# Patient Record
Sex: Male | Born: 1935 | Race: White | Hispanic: No | Smoking: Former smoker
Health system: Southern US, Community
[De-identification: ages and names within clinical notes are randomized; demographics above are authoritative.]

## PROBLEM LIST (undated history)

## (undated) DIAGNOSIS — G479 Sleep disorder, unspecified: Secondary | ICD-10-CM

## (undated) DIAGNOSIS — I2510 Atherosclerotic heart disease of native coronary artery without angina pectoris: Secondary | ICD-10-CM

## (undated) DIAGNOSIS — Z5189 Encounter for other specified aftercare: Secondary | ICD-10-CM

## (undated) DIAGNOSIS — Z951 Presence of aortocoronary bypass graft: Secondary | ICD-10-CM

## (undated) DIAGNOSIS — M1990 Unspecified osteoarthritis, unspecified site: Secondary | ICD-10-CM

## (undated) DIAGNOSIS — I10 Essential (primary) hypertension: Secondary | ICD-10-CM

## (undated) DIAGNOSIS — Z8719 Personal history of other diseases of the digestive system: Secondary | ICD-10-CM

## (undated) DIAGNOSIS — R0602 Shortness of breath: Secondary | ICD-10-CM

## (undated) DIAGNOSIS — Z955 Presence of coronary angioplasty implant and graft: Secondary | ICD-10-CM

## (undated) DIAGNOSIS — C3410 Malignant neoplasm of upper lobe, unspecified bronchus or lung: Secondary | ICD-10-CM

## (undated) DIAGNOSIS — J189 Pneumonia, unspecified organism: Secondary | ICD-10-CM

## (undated) DIAGNOSIS — R011 Cardiac murmur, unspecified: Secondary | ICD-10-CM

## (undated) DIAGNOSIS — I493 Ventricular premature depolarization: Secondary | ICD-10-CM

## (undated) DIAGNOSIS — A159 Respiratory tuberculosis unspecified: Secondary | ICD-10-CM

## (undated) DIAGNOSIS — I209 Angina pectoris, unspecified: Secondary | ICD-10-CM

## (undated) DIAGNOSIS — K219 Gastro-esophageal reflux disease without esophagitis: Secondary | ICD-10-CM

## (undated) DIAGNOSIS — J3489 Other specified disorders of nose and nasal sinuses: Secondary | ICD-10-CM

## (undated) DIAGNOSIS — D649 Anemia, unspecified: Secondary | ICD-10-CM

## (undated) DIAGNOSIS — Z8611 Personal history of tuberculosis: Secondary | ICD-10-CM

## (undated) DIAGNOSIS — N200 Calculus of kidney: Secondary | ICD-10-CM

## (undated) DIAGNOSIS — R42 Dizziness and giddiness: Secondary | ICD-10-CM

## (undated) DIAGNOSIS — IMO0001 Reserved for inherently not codable concepts without codable children: Secondary | ICD-10-CM

## (undated) DIAGNOSIS — I4892 Unspecified atrial flutter: Secondary | ICD-10-CM

## (undated) DIAGNOSIS — E785 Hyperlipidemia, unspecified: Secondary | ICD-10-CM

## (undated) HISTORY — DX: Presence of coronary angioplasty implant and graft: Z95.5

## (undated) HISTORY — PX: INGUINAL HERNIA REPAIR: SUR1180

## (undated) HISTORY — PX: CHOLECYSTECTOMY: SHX55

## (undated) HISTORY — DX: Ventricular premature depolarization: I49.3

## (undated) HISTORY — DX: Unspecified atrial flutter: I48.92

## (undated) HISTORY — DX: Presence of aortocoronary bypass graft: Z95.1

## (undated) HISTORY — PX: APPENDECTOMY: SHX54

## (undated) HISTORY — PX: PARTIAL GASTRECTOMY: SHX2172

## (undated) HISTORY — PX: CATARACT EXTRACTION W/ INTRAOCULAR LENS IMPLANT: SHX1309

## (undated) HISTORY — PX: LITHOTRIPSY: SUR834

## (undated) HISTORY — DX: Malignant neoplasm of upper lobe, unspecified bronchus or lung: C34.10

## (undated) HISTORY — PX: OTHER SURGICAL HISTORY: SHX169

---

## 1967-01-17 DIAGNOSIS — A159 Respiratory tuberculosis unspecified: Secondary | ICD-10-CM

## 1967-01-17 HISTORY — DX: Respiratory tuberculosis unspecified: A15.9

## 1999-03-24 ENCOUNTER — Encounter: Payer: Self-pay | Admitting: Cardiology

## 1999-03-24 ENCOUNTER — Inpatient Hospital Stay (HOSPITAL_COMMUNITY): Admission: AD | Admit: 1999-03-24 | Discharge: 1999-04-07 | Payer: Self-pay | Admitting: Cardiology

## 1999-03-25 ENCOUNTER — Encounter: Payer: Self-pay | Admitting: Cardiology

## 1999-03-25 ENCOUNTER — Encounter: Payer: Self-pay | Admitting: Thoracic Surgery (Cardiothoracic Vascular Surgery)

## 1999-03-25 HISTORY — PX: OTHER SURGICAL HISTORY: SHX169

## 1999-03-25 HISTORY — PX: CORONARY ARTERY BYPASS GRAFT: SHX141

## 1999-03-26 ENCOUNTER — Encounter: Payer: Self-pay | Admitting: Thoracic Surgery (Cardiothoracic Vascular Surgery)

## 1999-03-27 ENCOUNTER — Encounter: Payer: Self-pay | Admitting: Thoracic Surgery (Cardiothoracic Vascular Surgery)

## 1999-04-01 ENCOUNTER — Encounter: Payer: Self-pay | Admitting: Thoracic Surgery (Cardiothoracic Vascular Surgery)

## 1999-04-06 ENCOUNTER — Encounter: Payer: Self-pay | Admitting: Thoracic Surgery (Cardiothoracic Vascular Surgery)

## 2005-08-04 ENCOUNTER — Encounter: Admission: RE | Admit: 2005-08-04 | Discharge: 2005-08-04 | Payer: Self-pay | Admitting: Vascular Surgery

## 2005-08-17 ENCOUNTER — Ambulatory Visit (HOSPITAL_COMMUNITY): Admission: RE | Admit: 2005-08-17 | Discharge: 2005-08-17 | Payer: Self-pay | Admitting: Thoracic Surgery

## 2010-11-15 ENCOUNTER — Other Ambulatory Visit: Payer: Self-pay | Admitting: Cardiology

## 2010-11-15 ENCOUNTER — Ambulatory Visit
Admission: RE | Admit: 2010-11-15 | Discharge: 2010-11-15 | Disposition: A | Discharge status: Home / Self Care | Payer: Medicare Other | Source: Ambulatory Visit | Attending: Cardiology | Admitting: Cardiology

## 2010-11-15 DIAGNOSIS — R0789 Other chest pain: Secondary | ICD-10-CM

## 2010-11-16 ENCOUNTER — Encounter (HOSPITAL_COMMUNITY): Payer: Self-pay | Admitting: Cardiology

## 2010-11-16 ENCOUNTER — Ambulatory Visit (HOSPITAL_COMMUNITY)
Admission: RE | Admit: 2010-11-16 | Discharge: 2010-11-17 | Disposition: A | Discharge status: Home / Self Care | Payer: Medicare Other | Source: Ambulatory Visit | Attending: Cardiology | Admitting: Cardiology

## 2010-11-16 DIAGNOSIS — D62 Acute posthemorrhagic anemia: Secondary | ICD-10-CM | POA: Insufficient documentation

## 2010-11-16 DIAGNOSIS — Z951 Presence of aortocoronary bypass graft: Secondary | ICD-10-CM

## 2010-11-16 DIAGNOSIS — J3489 Other specified disorders of nose and nasal sinuses: Secondary | ICD-10-CM | POA: Insufficient documentation

## 2010-11-16 DIAGNOSIS — I2510 Atherosclerotic heart disease of native coronary artery without angina pectoris: Secondary | ICD-10-CM

## 2010-11-16 DIAGNOSIS — D696 Thrombocytopenia, unspecified: Secondary | ICD-10-CM | POA: Insufficient documentation

## 2010-11-16 DIAGNOSIS — Z8719 Personal history of other diseases of the digestive system: Secondary | ICD-10-CM

## 2010-11-16 DIAGNOSIS — E8779 Other fluid overload: Secondary | ICD-10-CM | POA: Insufficient documentation

## 2010-11-16 DIAGNOSIS — Z8611 Personal history of tuberculosis: Secondary | ICD-10-CM

## 2010-11-16 DIAGNOSIS — I4891 Unspecified atrial fibrillation: Secondary | ICD-10-CM | POA: Insufficient documentation

## 2010-11-16 DIAGNOSIS — Z01812 Encounter for preprocedural laboratory examination: Secondary | ICD-10-CM | POA: Insufficient documentation

## 2010-11-16 DIAGNOSIS — E785 Hyperlipidemia, unspecified: Secondary | ICD-10-CM

## 2010-11-16 DIAGNOSIS — R079 Chest pain, unspecified: Secondary | ICD-10-CM | POA: Insufficient documentation

## 2010-11-16 DIAGNOSIS — I25810 Atherosclerosis of coronary artery bypass graft(s) without angina pectoris: Secondary | ICD-10-CM | POA: Insufficient documentation

## 2010-11-16 HISTORY — DX: Atherosclerotic heart disease of native coronary artery without angina pectoris: I25.10

## 2010-11-16 HISTORY — DX: Hyperlipidemia, unspecified: E78.5

## 2010-11-16 HISTORY — DX: Personal history of tuberculosis: Z86.11

## 2010-11-16 HISTORY — DX: Presence of aortocoronary bypass graft: Z95.1

## 2010-11-16 HISTORY — DX: Personal history of other diseases of the digestive system: Z87.19

## 2010-11-17 ENCOUNTER — Encounter: Payer: Self-pay | Admitting: *Deleted

## 2010-11-17 ENCOUNTER — Ambulatory Visit (HOSPITAL_COMMUNITY): Payer: Medicare Other

## 2010-11-17 ENCOUNTER — Encounter (HOSPITAL_COMMUNITY): Payer: Self-pay | Admitting: Cardiology

## 2010-11-17 DIAGNOSIS — Z8611 Personal history of tuberculosis: Secondary | ICD-10-CM

## 2010-11-17 DIAGNOSIS — I2510 Atherosclerotic heart disease of native coronary artery without angina pectoris: Secondary | ICD-10-CM

## 2010-11-17 DIAGNOSIS — Z01810 Encounter for preprocedural cardiovascular examination: Secondary | ICD-10-CM

## 2010-11-17 HISTORY — DX: Personal history of tuberculosis: Z86.11

## 2010-11-17 LAB — CBC
Hemoglobin: 13.8 g/dL (ref 13.0–17.0)
MCHC: 32.9 g/dL (ref 30.0–36.0)
MCV: 95.9 fL (ref 78.0–100.0)
Platelets: 128 10*3/uL — ABNORMAL LOW (ref 150–400)
RBC: 4.37 MIL/uL (ref 4.22–5.81)
RDW: 13.4 % (ref 11.5–15.5)
WBC: 6.5 10*3/uL (ref 4.0–10.5)

## 2010-11-17 LAB — COMPREHENSIVE METABOLIC PANEL
ALT: 15 U/L (ref 0–53)
Albumin: 3.2 g/dL — ABNORMAL LOW (ref 3.5–5.2)
Creatinine, Ser: 0.91 mg/dL (ref 0.50–1.35)
GFR calc Af Amer: 88 mL/min — ABNORMAL LOW (ref >90)
Glucose, Bld: 84 mg/dL (ref 70–99)
Sodium: 139 mEq/L (ref 135–145)
Total Protein: 6.3 g/dL (ref 6.0–8.3)

## 2010-11-17 LAB — LIPID PANEL
Cholesterol: 141 mg/dL (ref 0–200)
HDL: 39 mg/dL — ABNORMAL LOW (ref >39)
Total CHOL/HDL Ratio: 3.6 RATIO

## 2010-11-17 LAB — PROTIME-INR: INR: 1.23 (ref 0.00–1.49)

## 2010-11-17 LAB — APTT: aPTT: 32 seconds (ref 24–37)

## 2010-11-18 ENCOUNTER — Encounter (HOSPITAL_COMMUNITY): Payer: Self-pay | Admitting: Pharmacy Technician

## 2010-11-18 NOTE — Consult Note (Signed)
Jacob Hampton, Jacob Hampton               ACCOUNT NO.:  [id number]  MEDICAL RECORD NO.:  [id number]  LOCATION:  4703                         FACILITY:  [facility]  PHYSICIAN:  Salvatore Decent. Cornelius Moras, M.D. DATE OF BIRTH:  10-03-1924  DATE OF CONSULTATION:  11/16/2010 DATE OF DISCHARGE:  11/17/2010                                CONSULTATION   REQUESTING PHYSICIAN:  Georga Hacking, MD  REASON FOR CONSULTATION:  Left main disease with three-vessel coronary artery disease and vein graft disease, status post coronary artery bypass grafting in 2001.  HISTORY OF PRESENT ILLNESS:  The patient is an 77 year old gentleman with known history of coronary artery disease, hyperlipidemia and peptic ulcer disease.  The patient originally underwent coronary artery bypass grafting x5 in 2001 for left main disease with 3-vessel coronary artery disease and normal left ventricular function.  Graft was placed at the time of surgery included left internal mammary artery to the distal left anterior descending coronary artery, saphenous vein graft to the diagonal branch, saphenous vein graft to the obtuse marginal branch of the left circumflex coronary artery, and sequential saphenous vein graft to the posterior descending and posterolateral branches of the right coronary artery.  The patient did have severe coagulopathy and bleeding early after surgery requiring re-exploration.  He recovered uneventfully from his surgery and has done remarkably well until recently.  Several weeks ago, the patient began to develop typical symptoms of angina pectoris.  Initially, the symptoms occurred when he was walking up a hill to go to a football game.  Since then, he has had 4 or 5 episodes of chest discomfort usually with physical exertion.  Symptoms are classical in nature and usually relieved promptly by rest.  He has had 1 or 2 episodes that have occurred after he has eaten meals.  Symptoms are promptly relieved with  nitroglycerin.  He was seen by Dr. Donnie Aho and scheduled for elective cardiac catheterizations.  Catheterizations was performed on November 16, 2010 and findings Were notable for severe left main disease with 95-99% distal stenosis of the left main coronary artery.  There is 100% proximal occlusion of the left anterior descending coronary artery.  There is severe 95-99% proximal stenosis of the right coronary artery with diffuse disease in the midportion and distal portion of that vessel.  The left internal mammary artery graft is widely patent to the left anterior descending coronary artery.  All of the previous vein grafts are occluded.  Left ventricular function is normal with no significant wall motion abnormalities.  Cardiothoracic surgical consultation was requested to consider redo surgical revascularization.  REVIEW OF SYSTEMS:  GENERAL: The patient reports otherwise feeling remarkably well and remains quite active physically for a gentleman his age.  He has normal appetite and has not been gaining nor losing weight recently.  CARDIAC:  Notable for accelerating symptoms of crescendo and angina pectoris as noted.  The patient denies any prolonged episodes of chest pain lasting more than a few minutes.  He denies any nocturnal angina.  He has mild exertional shortness of breath.  He denies resting shortness of breath, PND, orthopnea, or lower extremity edema.  He has not had tachypalpitations or syncope.  RESPIRATORY:  Negative.  The patient denies productive cough, hemoptysis, wheezing. GASTROINTESTINAL:  Negative.  The patient does have remote history of bleeding peptic ulcers for which he underwent subtotal gastrectomy in the past.  He has not had problems with ulcers since his subtotal gastrectomy many years ago.  He reports normal bowel function.  He does report some tendency towards diarrhea but this is not problematic.  He has not had hematochezia, hematemesis, melena.   MUSCULOSKELETAL: Negative.  The patient is remarkably free of any problems with arthritis or arthralgias.  NEUROLOGIC:  Negative.  The patient does report mild tendencies for short-term memory loss, but this has not at all been problematic and he otherwise reports no cognitive issues whatsoever.  He specifically denies any transient monocular blindness or transient numbness or weakness involving either upper or lower extremity. GENITOURINARY:  Negative.  HEENT:  Negative.  INFECTIOUS: Negative.  PAST MEDICAL HISTORY: 1. Coronary artery disease. 2. Hyperlipidemia. 3. Peptic ulcer disease. 4. Peripheral vascular disease.  PAST SURGICAL HISTORY: 1. Subtotal gastrectomy. 2. Herniorrhaphy. 3. Right testicle surgery. 4. Coronary artery bypass grafting x5, March 25, 1999.  FAMILY HISTORY:  Noncontributory.  SOCIAL HISTORY:  The patient is a widower who lives with 1 of the sons locally.  He remains remarkably active physically and enjoys bowling and walking on a treadmill.  He still drives an automobile and remains completely functionally independent.  He is retired, having previously worked at [employer].  He has a remote history of tobacco use, but he quit smoking in 1996.  He does not use alcohol.  MEDICATIONS PRIOR TO ADMISSION: 1. Toprol-XL 50 mg daily. 2. Lipitor 80 mg daily. 3. Folic acid 1 mg daily. 4. Nitroglycerin sublingual as needed.  DRUG ALLERGIES:  None known.  The patient has history of bleeding ulcer with the aspirin therapy.  PHYSICAL EXAMINATION:  GENERAL:  The patient is A well-appearing male who appears somewhat younger than stated age, in no acute distress.  He is in sinus rhythm. HEENT:  Unrevealing. NECK:  Supple.  There is no cervical nor supraclavicular lymphadenopathy.  There is no jugular venous distention.  There are no carotid bruits.  There is a well-healed median sternotomy scar. CHEST:  Auscultation of the chest demonstrates clear  breath sounds, which are symmetrical.  No wheezes, rales, or rhonchi are noted. CARDIOVASCULAR:  Notable for regular rate and rhythm.  No murmurs, rubs, or gallops are noted. ABDOMEN:  Soft, nondistended, and nontender.  There are no palpable masses. EXTREMITIES:  Warm and well perfused.  There is no lower extremity edema.  Distal pulses are not palpable.  There is a well-healed surgical scar up and down the entire left leg and the majority of the left thigh from previous saphenous vein procurement.  There is a small incision just above the ankle on the right where the saphenous vein was exposed. The remainder of the saphenous vein on the right appears to be intact. There is no sign of varicose veins or venous insufficiency. SKIN:  Clean, dry, and healthy appearing throughout. RECTAL:  Deferred. GU:  Deferred. NEUROLOGIC:  Grossly nonfocal and symmetrical bilaterally.  DIAGNOSTIC TESTS:  Cardiac catheterizations performed by Dr. Donnie Aho is reviewed.  This demonstrates severe left main disease with three-vessel coronary artery disease as well as vein graft disease, status post coronary artery bypass grafting in the past.  Specifically, there is 95- 99% discrete stenosis of the distal left main coronary artery just before the origin of the left circumflex coronary  artery.  The left circumflex coronary artery gives rise to a single large dominant obtuse marginal branch.  There is a significant angulation of the takeoff of the ostium of the left circumflex coronary artery just beyond the critical stenosis of the left main coronary artery.  The left anterior descending coronary artery is chronically occluded.  There is severe 95- 99% proximal discrete stenosis of the right coronary artery.  The midportion of the right coronary artery has diffuse disease.  The left internal mammary artery graft is widely patent to the distal left anterior descending coronary artery and without any  significant lesions. All of the previous vein grafts are occluded.  Left ventricular function appears normal.  IMPRESSION:  Left main disease with three-vessel coronary artery disease and normal left ventricular function.  The patient presents with accelerating symptoms of angina pectoris.  He has vein graft disease with all previous vein grafts from bypass surgery in 2001, now occluded. The left internal mammary artery graft is widely patent to the distal left anterior descending coronary artery.  Options at this juncture include redo coronary artery bypass grafting versus high-risk percutaneous coronary intervention and stenting of the left main, which is protected in the proximal right coronary artery.  Due to the severe angulation of the ostium of the left circumflex, percutaneous coronary intervention and stenting of the left main might be potentially risky despite the presence of the patent left internal mammary artery graft. Surgical revascularization of the distal right coronary circulation may be potentially hampered by diffuse disease in the target vessels for grafting.  Nonetheless, I suspect that the patient would probably do best with redo coronary artery bypass grafting.  Risks of surgery will be significantly elevated because of the patient's advanced age. Portions of the vein utilized at the time of the surgery in 2001, were apparently poor quality.  The patient does appear to have saphenous vein conduit in the right thigh and the upper portion of the right lower leg, which could be used for redo coronary artery bypass grafting.  We could always use left radial artery if necessary if inadequate vein conduit was noted intraoperatively.  PLAN:  I have discussed the matters at length with Mr. Hogston and his entire family.  The relative risks and benefits of redo coronary artery bypass grafting have been compared and contrasted with high-risk PCI and stenting.  With  continued medical therapy, I think the patient would be at remarkably high risk for significant heart attack in the near future, and as a result, I think that something will need to be done.  Overall, I suspect the patient would do best with surgery.  The patient and his family understand and accept all potential associated risks including, but not limited to risk of death, stroke, myocardial infarction, congestive heart failure, respiratory failure, renal failure, bleeding requiring blood transfusion, or re-exploration following surgery, arrhythmia, infection, late recurrence of coronary artery disease.  They understand the issues and risks related to repeat coronary artery bypass surgery as well as issues related to the patient's advanced age.  The patient understands that he may need at least temporary assistance during his recovery and he potentially could wind up losing some functional independence if he has any complications following surgery.  All of his questions have been addressed.  We tentatively plan to proceed with surgery on Tuesday, November 22, 2010.     Salvatore Decent. Cornelius Moras, M.D.     CHO/MEDQ  D:  11/17/2010  T:  11/17/2010  Job:  [id number]  cc:   Georga Hacking, M.D. Warrick Parisian, MD  Electronically Signed by Tressie Stalker M.D. on 11/18/2010 08:08:59 AM

## 2010-11-20 LAB — TYPE AND SCREEN
Donor AG Type: NEGATIVE
Unit division: 0

## 2010-11-21 ENCOUNTER — Encounter: Payer: Self-pay | Admitting: Thoracic Surgery (Cardiothoracic Vascular Surgery)

## 2010-11-21 ENCOUNTER — Encounter (HOSPITAL_COMMUNITY): Payer: Self-pay

## 2010-11-21 ENCOUNTER — Ambulatory Visit (INDEPENDENT_AMBULATORY_CARE_PROVIDER_SITE_OTHER): Payer: Medicare Other | Admitting: Thoracic Surgery (Cardiothoracic Vascular Surgery)

## 2010-11-21 ENCOUNTER — Encounter (HOSPITAL_COMMUNITY)
Admit: 2010-11-21 | Discharge: 2010-11-21 | Disposition: A | Discharge status: Home / Self Care | Payer: Medicare Other | Attending: Thoracic Surgery (Cardiothoracic Vascular Surgery) | Admitting: Thoracic Surgery (Cardiothoracic Vascular Surgery)

## 2010-11-21 VITALS — BP 133/78 | HR 64 | Resp 20 | Ht 67.0 in | Wt 164.0 lb

## 2010-11-21 DIAGNOSIS — I2510 Atherosclerotic heart disease of native coronary artery without angina pectoris: Secondary | ICD-10-CM

## 2010-11-21 HISTORY — DX: Encounter for other specified aftercare: Z51.89

## 2010-11-21 HISTORY — DX: Respiratory tuberculosis unspecified: A15.9

## 2010-11-21 HISTORY — DX: Essential (primary) hypertension: I10

## 2010-11-21 HISTORY — DX: Pneumonia, unspecified organism: J18.9

## 2010-11-21 HISTORY — DX: Gastro-esophageal reflux disease without esophagitis: K21.9

## 2010-11-21 HISTORY — DX: Angina pectoris, unspecified: I20.9

## 2010-11-21 HISTORY — DX: Reserved for inherently not codable concepts without codable children: IMO0001

## 2010-11-21 LAB — SURGICAL PCR SCREEN
MRSA, PCR: NEGATIVE
Staphylococcus aureus: NEGATIVE

## 2010-11-21 MED ORDER — DIAZEPAM 5 MG PO TABS
5.0000 mg | ORAL_TABLET | Freq: Once | ORAL | Status: DC
  Administered 2010-11-22: 5 mg via ORAL

## 2010-11-21 MED ORDER — SODIUM CHLORIDE 0.9 % IV SOLN
INTRAVENOUS | Status: DC
  Filled 2010-11-21: qty 1

## 2010-11-21 MED ORDER — NITROGLYCERIN IN D5W 200-5 MCG/ML-% IV SOLN
2.0000 ug/min | INTRAVENOUS | Status: DC
  Filled 2010-11-21: qty 250

## 2010-11-21 MED ORDER — DEXMEDETOMIDINE HCL 100 MCG/ML IV SOLN
0.1000 ug/kg/h | INTRAVENOUS | Status: DC
  Filled 2010-11-21: qty 4

## 2010-11-21 MED ORDER — MAGNESIUM SULFATE 50 % IJ SOLN
40.0000 meq | INTRAMUSCULAR | Status: DC
  Filled 2010-11-21: qty 10

## 2010-11-21 MED ORDER — EPINEPHRINE HCL 1 MG/ML IJ SOLN
0.5000 ug/min | INTRAMUSCULAR | Status: DC
  Filled 2010-11-21: qty 4

## 2010-11-21 MED ORDER — VANCOMYCIN HCL IN DEXTROSE 1-5 GM/200ML-% IV SOLN
1000.0000 mg | INTRAVENOUS | Status: DC
  Filled 2010-11-21: qty 200

## 2010-11-21 MED ORDER — DEXTROSE 5 % IV SOLN
750.0000 mg | INTRAVENOUS | Status: DC
  Filled 2010-11-21: qty 750

## 2010-11-21 MED ORDER — POTASSIUM CHLORIDE 2 MEQ/ML IV SOLN
80.0000 meq | INTRAVENOUS | Status: DC
  Filled 2010-11-21: qty 40

## 2010-11-21 MED ORDER — PHENYLEPHRINE HCL 10 MG/ML IJ SOLN
30.0000 ug/min | INTRAVENOUS | Status: DC
  Filled 2010-11-21: qty 2

## 2010-11-21 MED ORDER — PLASMA-LYTE 148 IV SOLN
INTRAVENOUS | Status: DC
  Administered 2010-11-22: 10:00:00
  Filled 2010-11-21: qty 0.5

## 2010-11-21 MED ORDER — SODIUM CHLORIDE 0.9 % IV SOLN
INTRAVENOUS | Status: DC
  Filled 2010-11-21: qty 40

## 2010-11-21 MED ORDER — DOPAMINE-DEXTROSE 3.2-5 MG/ML-% IV SOLN
2.0000 ug/kg/min | INTRAVENOUS | Status: DC
  Filled 2010-11-21: qty 250

## 2010-11-21 MED ORDER — DEXTROSE 5 % IV SOLN
1.5000 g | INTRAVENOUS | Status: DC
  Filled 2010-11-21: qty 1.5

## 2010-11-21 MED ORDER — VANCOMYCIN HCL 1000 MG IV SOLR
1250.0000 mg | INTRAVENOUS | Status: DC
  Filled 2010-11-21: qty 1250

## 2010-11-21 MED ORDER — METOPROLOL TARTRATE 12.5 MG HALF TABLET: 12.5000 mg | ORAL_TABLET | Freq: Once | ORAL | Status: DC

## 2010-11-21 NOTE — H&P (Signed)
HISTORY OF PRESENT ILLNESS:  The patient is an 77 year old gentleman with known history of coronary artery disease, hyperlipidemia and peptic ulcer disease.  The patient originally underwent coronary artery bypass grafting x5 in 2001 for left main disease with 3-vessel coronary artery disease and normal left ventricular function.  Graft was placed at the time of surgery included left internal mammary artery to the distal left anterior descending coronary artery, saphenous vein graft to the diagonal branch, saphenous vein graft to the obtuse marginal branch of the left circumflex coronary artery, and sequential saphenous vein graft to the posterior descending and posterolateral branches of the right coronary artery.  The patient did have severe coagulopathy and bleeding early after surgery requiring re-exploration.  He recovered uneventfully from his surgery and has done remarkably well until recently.  Several weeks ago, the patient began to develop typical symptoms of angina pectoris.  Initially, the symptoms occurred when he was walking up a hill to go to a football game.  Since then, he has had 4 or 5 episodes of chest discomfort usually with physical exertion.  Symptoms are classical in nature and usually relieved promptly by rest.  He has had 1 or 2 episodes that have occurred after he has eaten meals.  Symptoms are promptly relieved with nitroglycerin.  He was seen by Dr. Donnie Aho and scheduled for elective cardiac catheterizations.  Catheterizations was performed on November 16, 2010 and findings Were notable for severe left main disease with 95-99% distal stenosis of the left main coronary artery.  There is 100% proximal occlusion of the left anterior descending coronary artery.  There is severe 95-99% proximal stenosis of the right coronary artery with diffuse disease in the midportion and distal portion of that vessel.  The left internal mammary artery graft is widely patent to the  left anterior descending coronary artery.  All of the previous vein grafts are occluded.  Left ventricular function is normal with no significant wall motion abnormalities.  Cardiothoracic surgical consultation was requested to consider redo surgical revascularization.   REVIEW OF SYSTEMS:  GENERAL: The patient reports otherwise feeling remarkably well and remains quite active physically for a gentleman his age.  He has normal appetite and has not been gaining nor losing weight recently.  CARDIAC:  Notable for accelerating symptoms of crescendo and angina pectoris as noted.  The patient denies any prolonged episodes of chest pain lasting more than a few minutes.  He denies any nocturnal angina.  He has mild exertional shortness of breath.  He denies resting shortness of breath, PND, orthopnea, or lower extremity edema.  He has not had tachypalpitations or syncope.  RESPIRATORY:  Negative.  The patient denies productive cough, hemoptysis, wheezing. GASTROINTESTINAL:  Negative.  The patient does have remote history of bleeding peptic ulcers for which he underwent subtotal gastrectomy in the past.  He has not had problems with ulcers since his subtotal gastrectomy many years ago.  He reports normal bowel function.  He does report some tendency towards diarrhea but this is not problematic.  He has not had hematochezia, hematemesis, melena.  MUSCULOSKELETAL: Negative.  The patient is remarkably free of any problems with arthritis or arthralgias.  NEUROLOGIC:  Negative.  The patient does report mild tendencies for short-term memory loss, but this has not at all been problematic and he otherwise reports no cognitive issues whatsoever.  He specifically denies any transient monocular blindness or transient numbness or weakness involving either upper or lower extremity. GENITOURINARY:  Negative.  HEENT:  Negative.  INFECTIOUS: Negative.   PAST MEDICAL HISTORY: 1. Coronary artery disease. 2.  Hyperlipidemia. 3. Peptic ulcer disease. 4. Peripheral vascular disease.   PAST SURGICAL HISTORY: 1. Subtotal gastrectomy. 2. Herniorrhaphy. 3. Right testicle surgery. 4. Coronary artery bypass grafting x5, March 25, 1999.   FAMILY HISTORY:  Noncontributory.   SOCIAL HISTORY:  The patient is a widower who lives with 1 of the sons locally.  He remains remarkably active physically and enjoys bowling and walking on a treadmill.  He still drives an automobile and remains completely functionally independent.  He is retired, having previously worked at [employer].  He has a remote history of tobacco use, but he quit smoking in 1996.  He does not use alcohol.   MEDICATIONS PRIOR TO ADMISSION: 1. Toprol-XL 50 mg daily. 2. Lipitor 80 mg daily. 3. Folic acid 1 mg daily. 4. Nitroglycerin sublingual as needed.   DRUG ALLERGIES:  None known.  The patient has history of bleeding ulcer with the aspirin therapy.   PHYSICAL EXAMINATION:  GENERAL:  The patient is A well-appearing male who appears somewhat younger than stated age, in no acute distress.  He is in sinus rhythm. HEENT:  Unrevealing. NECK:  Supple.  There is no cervical nor supraclavicular lymphadenopathy.  There is no jugular venous distention. There are no carotid bruits.  There is a well-healed median sternotomy scar. CHEST:  Auscultation of the chest demonstrates clear breath sounds, which are symmetrical.  No wheezes, rales, or rhonchi are noted. CARDIOVASCULAR:  Notable for regular rate and rhythm.  No murmurs, rubs, or gallops are noted. ABDOMEN:  Soft, nondistended, and nontender.  There are no palpable masses. EXTREMITIES:  Warm and well perfused.  There is no lower extremity edema.  Distal pulses are not palpable.  There is a well-healed surgical scar up and down the entire left leg and the majority of the left thigh from previous saphenous vein procurement.  There is a small incision just above the ankle on  the right where the saphenous vein was exposed. The remainder of the saphenous vein on the right appears to be intact. There is no sign of varicose veins or venous insufficiency. SKIN:  Clean, dry, and healthy appearing throughout. RECTAL:  Deferred. GU:  Deferred. NEUROLOGIC:  Grossly nonfocal and symmetrical bilaterally.   DIAGNOSTIC TESTS:  Cardiac catheterizations performed by Dr. Donnie Aho is reviewed.  This demonstrates severe left main disease with three-vessel coronary artery disease as well as vein graft disease, status post coronary artery bypass grafting in the past.  Specifically, there is 95- 99% discrete stenosis of the distal left main coronary artery just before the origin of the left circumflex coronary artery.  The left circumflex coronary artery gives rise to a single large dominant obtuse marginal branch.  There is a significant angulation of the takeoff of the ostium of the left circumflex coronary artery just beyond the critical stenosis of the left main coronary artery.  The left anterior descending coronary artery is chronically occluded.  There is severe 95- 99% proximal discrete stenosis of the right coronary artery.  The midportion of the right coronary artery has diffuse disease.  The left internal mammary artery graft is widely patent to the distal left anterior descending coronary artery and without any significant lesions. All of the previous vein grafts are occluded.  Left ventricular function appears normal.   IMPRESSION:  Left main disease with three-vessel coronary artery disease and normal left ventricular function.  The patient presents with accelerating  symptoms of angina pectoris.  He has vein graft disease with all previous vein grafts from bypass surgery in 2001, now occluded. The left internal mammary artery graft is widely patent to the distal left anterior descending coronary artery.  Options at this juncture include redo coronary artery bypass  grafting versus high-risk percutaneous coronary intervention and stenting of the left main, which is protected in the proximal right coronary artery.  Due to the severe angulation of the ostium of the left circumflex, percutaneous coronary intervention and stenting of the left main might be potentially risky despite the presence of the patent left internal mammary artery graft. Surgical revascularization of the distal right coronary circulation may be potentially hampered by diffuse disease in the target vessels for grafting.  Nonetheless, I suspect that the patient would probably do best with redo coronary artery bypass grafting.  Risks of surgery will be significantly elevated because of the patient's advanced age. Portions of the vein utilized at the time of the surgery in 2001, were apparently poor quality.  The patient does appear to have saphenous vein conduit in the right thigh and the upper portion of the right lower leg, which could be used for redo coronary artery bypass grafting.  We could always use left radial artery if necessary if inadequate vein conduit was noted intraoperatively.   PLAN:  I have discussed the matters at length with Mr. Roessner and his entire family.  The relative risks and benefits of redo coronary artery bypass grafting have been compared and contrasted with high-risk PCI and stenting.  With continued medical therapy, I think the patient would be at remarkably high risk for significant heart attack in the near future, and as a result, I think that something will need to be done.  Overall, I suspect the patient would do best with surgery.  The patient and his family understand and accept all potential associated risks including, but not limited to risk of death, stroke, myocardial infarction, congestive heart failure, respiratory failure, renal failure, bleeding requiring blood transfusion, or re-exploration following surgery, arrhythmia, infection, late  recurrence of coronary artery disease.  They understand the issues and risks related to repeat coronary artery bypass surgery as well as issues related to the patient's advanced age.  The patient understands that he may need at least temporary assistance during his recovery and he potentially could wind up losing some functional independence if he has any complications following surgery.  All of his questions have been addressed.  We tentatively plan to proceed with surgery on Tuesday, November 22, 2010.

## 2010-11-21 NOTE — Pre-Procedure Instructions (Signed)
20 Jacob Hampton  11/21/2010   Your procedure is scheduled on:  11-22-2010 TUESDAY  Report to [facility] at 5:30 AM.  Call this number if you have problems the morning of surgery: [phone]   Remember:   Do not eat food:After Midnight.  Do not drink clear liquids: 4 Hours before arrival.  Take these medicines the morning of surgery with A SIP OF WATER: ASPIRIN, METOPROLOL   Do not wear jewelry, make-up or nail polish.  Do not wear lotions, powders, or perfumes. You may wear deodorant.  Do not shave 48 hours prior to surgery.  Do not bring valuables to the hospital.  Contacts, dentures or bridgework may not be worn into surgery.  Leave suitcase in the car. After surgery it may be brought to your room.  For patients admitted to the hospital, checkout time is 11:00 AM the day of discharge.   Patients discharged the day of surgery will not be allowed to drive home.  Name and phone number of your driver: [name] [phone] OR [name] [phone]  Special Instructions: Incentive Spirometry - Practice and bring it with you on the day of surgery. and CHG Shower Use Special Wash: 1/2 bottle night before surgery and 1/2 bottle morning of surgery.   Please read over the following fact sheets that you were given: Pain Booklet, Coughing and Deep Breathing, Blood Transfusion Information, Open Heart Packet, MRSA Information and Surgical Site Infection Prevention

## 2010-11-21 NOTE — Progress Notes (Signed)
Patient returns for followup of severe left main disease and three-vessel coronary artery disease with vein graft disease. He was seen in consultation on 11/16/2010 and a full consultation report was dictated at that time. The patient plans to proceed with elective redo or he artery bypass grafting tomorrow. Over the weekend the patient has done quite well. He specifically denies any recurrent symptoms of chest discomfort. He has no shortness of breath. He otherwise feels quite well and reports no new complaints.  The patient's physical exam is unchanged from last week.  We spent in excess of 30 minutes again reviewing the indications, risks, and potential benefits of surgery. The patient specifically understands and accepts all potential associated risks including but not limited to risk of death, stroke, myocardial infarction, congestive heart failure, respiratory failure, pneumonia, bleeding requiring blood transfusion, arrhythmia, infection, late recurrence of coronary artery disease. All of his questions been addressed.

## 2010-11-22 ENCOUNTER — Inpatient Hospital Stay (HOSPITAL_COMMUNITY): Payer: Medicare Other | Admitting: Anesthesiology

## 2010-11-22 ENCOUNTER — Other Ambulatory Visit: Payer: Self-pay

## 2010-11-22 ENCOUNTER — Inpatient Hospital Stay (HOSPITAL_COMMUNITY)
Admission: RE | Admit: 2010-11-22 | Discharge: 2010-11-28 | DRG: 236 | Disposition: A | Discharge status: Skilled Nursing Facility | Payer: Medicare Other | Source: Ambulatory Visit | Attending: Thoracic Surgery (Cardiothoracic Vascular Surgery) | Admitting: Thoracic Surgery (Cardiothoracic Vascular Surgery)

## 2010-11-22 ENCOUNTER — Encounter: Payer: Self-pay | Admitting: Thoracic Surgery (Cardiothoracic Vascular Surgery)

## 2010-11-22 ENCOUNTER — Encounter (HOSPITAL_COMMUNITY): Payer: Self-pay | Admitting: *Deleted

## 2010-11-22 ENCOUNTER — Encounter (HOSPITAL_COMMUNITY)
Admission: RE | Disposition: A | Discharge status: Skilled Nursing Facility | Payer: Self-pay | Source: Ambulatory Visit | Attending: Thoracic Surgery (Cardiothoracic Vascular Surgery)

## 2010-11-22 ENCOUNTER — Inpatient Hospital Stay (HOSPITAL_COMMUNITY): Payer: Medicare Other

## 2010-11-22 ENCOUNTER — Encounter (HOSPITAL_COMMUNITY): Payer: Self-pay | Admitting: Anesthesiology

## 2010-11-22 DIAGNOSIS — Z87891 Personal history of nicotine dependence: Secondary | ICD-10-CM

## 2010-11-22 DIAGNOSIS — I2510 Atherosclerotic heart disease of native coronary artery without angina pectoris: Principal | ICD-10-CM | POA: Diagnosis present

## 2010-11-22 DIAGNOSIS — J3489 Other specified disorders of nose and nasal sinuses: Secondary | ICD-10-CM | POA: Diagnosis present

## 2010-11-22 DIAGNOSIS — Z951 Presence of aortocoronary bypass graft: Secondary | ICD-10-CM

## 2010-11-22 DIAGNOSIS — D62 Acute posthemorrhagic anemia: Secondary | ICD-10-CM | POA: Diagnosis not present

## 2010-11-22 DIAGNOSIS — D696 Thrombocytopenia, unspecified: Secondary | ICD-10-CM | POA: Diagnosis not present

## 2010-11-22 DIAGNOSIS — Z8611 Personal history of tuberculosis: Secondary | ICD-10-CM

## 2010-11-22 DIAGNOSIS — E785 Hyperlipidemia, unspecified: Secondary | ICD-10-CM | POA: Diagnosis present

## 2010-11-22 DIAGNOSIS — E8779 Other fluid overload: Secondary | ICD-10-CM | POA: Diagnosis not present

## 2010-11-22 DIAGNOSIS — I4891 Unspecified atrial fibrillation: Secondary | ICD-10-CM | POA: Diagnosis not present

## 2010-11-22 DIAGNOSIS — Z01812 Encounter for preprocedural laboratory examination: Secondary | ICD-10-CM

## 2010-11-22 HISTORY — DX: Anemia, unspecified: D64.9

## 2010-11-22 HISTORY — DX: Dizziness and giddiness: R42

## 2010-11-22 HISTORY — DX: Other specified disorders of nose and nasal sinuses: J34.89

## 2010-11-22 HISTORY — DX: Unspecified osteoarthritis, unspecified site: M19.90

## 2010-11-22 HISTORY — DX: Sleep disorder, unspecified: G47.9

## 2010-11-22 HISTORY — PX: CORONARY ARTERY BYPASS GRAFT: SHX141

## 2010-11-22 HISTORY — PX: OTHER SURGICAL HISTORY: SHX169

## 2010-11-22 HISTORY — PX: RADIAL ARTERY HARVEST: SHX5067

## 2010-11-22 LAB — POCT I-STAT 4, (NA,K, GLUC, HGB,HCT)
Glucose, Bld: 103 mg/dL — ABNORMAL HIGH (ref 70–99)
Glucose, Bld: 83 mg/dL (ref 70–99)
Glucose, Bld: 94 mg/dL (ref 70–99)
Glucose, Bld: 117 mg/dL — ABNORMAL HIGH (ref 70–99)
Glucose, Bld: 126 mg/dL — ABNORMAL HIGH (ref 70–99)
HCT: 32 % — ABNORMAL LOW (ref 39.0–52.0)
Hemoglobin: 10.9 g/dL — ABNORMAL LOW (ref 13.0–17.0)
Hemoglobin: 11.2 g/dL — ABNORMAL LOW (ref 13.0–17.0)
Hemoglobin: 8.8 g/dL — ABNORMAL LOW (ref 13.0–17.0)
Potassium: 4.2 mEq/L (ref 3.5–5.1)
Potassium: 4.5 mEq/L (ref 3.5–5.1)
Potassium: 4 mEq/L (ref 3.5–5.1)
Sodium: 139 mEq/L (ref 135–145)
Sodium: 140 mEq/L (ref 135–145)

## 2010-11-22 LAB — MAGNESIUM: Magnesium: 3 mg/dL — ABNORMAL HIGH (ref 1.5–2.5)

## 2010-11-22 LAB — POCT I-STAT 3, ART BLOOD GAS (G3+)
O2 Saturation: 99 %
O2 Saturation: 99 %
Patient temperature: 36.9
TCO2: 25 mmol/L (ref 0–100)
pCO2 arterial: 33.9 mmHg — ABNORMAL LOW (ref 35.0–45.0)
pCO2 arterial: 34.1 mmHg — ABNORMAL LOW (ref 35.0–45.0)
pCO2 arterial: 38.6 mmHg (ref 35.0–45.0)
pH, Arterial: 7.462 — ABNORMAL HIGH (ref 7.350–7.450)
pH, Arterial: 7.404 (ref 7.350–7.450)
pO2, Arterial: 145 mmHg — ABNORMAL HIGH (ref 80.0–100.0)
pO2, Arterial: 111 mmHg — ABNORMAL HIGH (ref 80.0–100.0)

## 2010-11-22 LAB — DIC (DISSEMINATED INTRAVASCULAR COAGULATION)PANEL
D-Dimer, Quant: 0.86 ug/mL-FEU — ABNORMAL HIGH (ref 0.00–0.48)
Platelets: 128 10*3/uL — ABNORMAL LOW (ref 150–400)

## 2010-11-22 LAB — CBC
HCT: 20.9 % — ABNORMAL LOW (ref 39.0–52.0)
Hemoglobin: 7 g/dL — ABNORMAL LOW (ref 13.0–17.0)
Hemoglobin: 7.6 g/dL — ABNORMAL LOW (ref 13.0–17.0)
Hemoglobin: 10.7 g/dL — ABNORMAL LOW (ref 13.0–17.0)
MCH: 31.8 pg (ref 26.0–34.0)
MCH: 30.3 pg (ref 26.0–34.0)
MCHC: 33.5 g/dL (ref 30.0–36.0)
Platelets: 125 10*3/uL — ABNORMAL LOW (ref 150–400)
RBC: 2.42 MIL/uL — ABNORMAL LOW (ref 4.22–5.81)
RBC: 3.53 MIL/uL — ABNORMAL LOW (ref 4.22–5.81)
RDW: 13.6 % (ref 11.5–15.5)
WBC: 6 10*3/uL (ref 4.0–10.5)
WBC: 6.8 10*3/uL (ref 4.0–10.5)

## 2010-11-22 LAB — CREATININE, SERUM
Creatinine, Ser: 0.8 mg/dL (ref 0.50–1.35)
GFR calc Af Amer: >90 mL/min (ref >90)
GFR calc non Af Amer: 80 mL/min — ABNORMAL LOW (ref >90)

## 2010-11-22 LAB — GLUCOSE, CAPILLARY
Glucose-Capillary: 100 mg/dL — ABNORMAL HIGH (ref 70–99)
Glucose-Capillary: 124 mg/dL — ABNORMAL HIGH (ref 70–99)
Glucose-Capillary: 100 mg/dL — ABNORMAL HIGH (ref 70–99)

## 2010-11-22 LAB — POCT I-STAT, CHEM 8
HCT: 27 % — ABNORMAL LOW (ref 39.0–52.0)
Hemoglobin: 9.2 g/dL — ABNORMAL LOW (ref 13.0–17.0)
Sodium: 139 mEq/L (ref 135–145)
TCO2: 23 mmol/L (ref 0–100)

## 2010-11-22 LAB — PROTIME-INR
INR: 0.86 (ref 0.00–1.49)
Prothrombin Time: 11.9 seconds (ref 11.6–15.2)

## 2010-11-22 LAB — PLATELET COUNT: Platelets: 90 10*3/uL — ABNORMAL LOW (ref 150–400)

## 2010-11-22 LAB — HEMOGLOBIN AND HEMATOCRIT, BLOOD
HCT: 24.5 % — ABNORMAL LOW (ref 39.0–52.0)
Hemoglobin: 8.4 g/dL — ABNORMAL LOW (ref 13.0–17.0)

## 2010-11-22 LAB — APTT: aPTT: 39 seconds — ABNORMAL HIGH (ref 24–37)

## 2010-11-22 SURGERY — REDO CORONARY ARTERY BYPASS GRAFTING (CABG)TIMES TWO
Anesthesia: General | Site: Chest | Wound class: Clean

## 2010-11-22 MED ORDER — DOCUSATE SODIUM 100 MG PO CAPS
200.0000 mg | ORAL_CAPSULE | Freq: Every day | ORAL | Status: DC
  Administered 2010-11-23 – 2010-11-28 (×5): 200 mg via ORAL
  Filled 2010-11-22 (×7): qty 2

## 2010-11-22 MED ORDER — MIDAZOLAM HCL 2 MG/2ML IJ SOLN
2.0000 mg | INTRAMUSCULAR | Status: DC | PRN
  Administered 2010-11-22: 2 mg via INTRAVENOUS
  Filled 2010-11-22 (×2): qty 2

## 2010-11-22 MED ORDER — SODIUM CHLORIDE 0.9 % IV SOLN
100.0000 [IU] | INTRAVENOUS | Status: DC | PRN
  Administered 2010-11-22: 1.3 [IU]/h via INTRAVENOUS

## 2010-11-22 MED ORDER — VANCOMYCIN HCL 1000 MG IV SOLR
1000.0000 mg | Freq: Once | INTRAVENOUS | Status: DC
  Filled 2010-11-22: qty 1000

## 2010-11-22 MED ORDER — POTASSIUM CHLORIDE 10 MEQ/50ML IV SOLN
10.0000 meq | INTRAVENOUS | Status: AC
  Filled 2010-11-22 (×3): qty 50

## 2010-11-22 MED ORDER — VANCOMYCIN HCL 1000 MG IV SOLR
1000.0000 mg | INTRAVENOUS | Status: DC | PRN
  Administered 2010-11-22: 1250 mg via INTRAVENOUS

## 2010-11-22 MED ORDER — HEMOSTATIC AGENTS (NO CHARGE) OPTIME
TOPICAL | Status: DC | PRN
  Administered 2010-11-22 (×2): 1 via TOPICAL

## 2010-11-22 MED ORDER — METOPROLOL TARTRATE 12.5 MG HALF TABLET
12.5000 mg | ORAL_TABLET | Freq: Two times a day (BID) | ORAL | Status: DC
  Filled 2010-11-22 (×3): qty 1

## 2010-11-22 MED ORDER — ISOSORBIDE MONONITRATE ER 30 MG PO TB24
30.0000 mg | ORAL_TABLET | Freq: Every day | ORAL | Status: AC
  Administered 2010-11-23 – 2010-11-27 (×5): 30 mg via ORAL
  Filled 2010-11-22 (×5): qty 1

## 2010-11-22 MED ORDER — FENTANYL CITRATE 0.05 MG/ML IJ SOLN
INTRAMUSCULAR | Status: DC | PRN
  Administered 2010-11-22 (×4): 50 ug via INTRAVENOUS
  Administered 2010-11-22: 200 ug via INTRAVENOUS
  Administered 2010-11-22: 1000 ug via INTRAVENOUS
  Administered 2010-11-22 (×2): 50 ug via INTRAVENOUS

## 2010-11-22 MED ORDER — ASPIRIN 81 MG PO CHEW
324.0000 mg | CHEWABLE_TABLET | Freq: Every day | ORAL | Status: DC
  Filled 2010-11-22 (×2): qty 4

## 2010-11-22 MED ORDER — SODIUM CHLORIDE 0.45 % IV SOLN
INTRAVENOUS | Status: DC
  Administered 2010-11-22: 17:00:00 via INTRAVENOUS

## 2010-11-22 MED ORDER — SODIUM CHLORIDE 0.9 % IV SOLN
200.0000 ug | INTRAVENOUS | Status: DC | PRN
  Administered 2010-11-22: 0.2 ug/kg/h via INTRAVENOUS

## 2010-11-22 MED ORDER — ALBUMIN HUMAN 5 % IV SOLN
250.0000 mL | INTRAVENOUS | Status: DC | PRN
  Filled 2010-11-22: qty 250

## 2010-11-22 MED ORDER — THROMBIN 20000 UNITS EX KIT: PACK | OROMUCOSAL | Status: DC | PRN

## 2010-11-22 MED ORDER — MIDAZOLAM HCL 5 MG/5ML IJ SOLN
INTRAMUSCULAR | Status: DC | PRN
  Administered 2010-11-22: 2 mg via INTRAVENOUS
  Administered 2010-11-22: 1 mg via INTRAVENOUS
  Administered 2010-11-22: 4 mg via INTRAVENOUS
  Administered 2010-11-22: 3 mg via INTRAVENOUS
  Administered 2010-11-22: 2 mg via INTRAVENOUS
  Administered 2010-11-22: 3 mg via INTRAVENOUS

## 2010-11-22 MED ORDER — DEXTROSE 5 % IV SOLN
1.5000 g | Freq: Two times a day (BID) | INTRAVENOUS | Status: DC
  Administered 2010-11-22 – 2010-11-24 (×4): 1.5 g via INTRAVENOUS
  Filled 2010-11-22 (×8): qty 1.5

## 2010-11-22 MED ORDER — ACETAMINOPHEN 325 MG PO TABS
650.0000 mg | ORAL_TABLET | ORAL | Status: AC
  Filled 2010-11-22: qty 2

## 2010-11-22 MED ORDER — METOCLOPRAMIDE HCL 10 MG PO TABS
10.0000 mg | ORAL_TABLET | Freq: Four times a day (QID) | ORAL | Status: AC
  Administered 2010-11-23 (×3): 10 mg via ORAL
  Filled 2010-11-22 (×4): qty 1

## 2010-11-22 MED ORDER — BISACODYL 5 MG PO TBEC
10.0000 mg | DELAYED_RELEASE_TABLET | Freq: Every day | ORAL | Status: DC
  Administered 2010-11-23 – 2010-11-28 (×5): 10 mg via ORAL
  Filled 2010-11-22: qty 1
  Filled 2010-11-22 (×6): qty 2

## 2010-11-22 MED ORDER — VANCOMYCIN HCL 1000 MG IV SOLR
1000.0000 mg | Freq: Once | INTRAVENOUS | Status: AC
  Administered 2010-11-22: 1000 mg via INTRAVENOUS
  Filled 2010-11-22 (×2): qty 1000

## 2010-11-22 MED ORDER — SODIUM CHLORIDE 0.9 % IJ SOLN
3.0000 mL | Freq: Two times a day (BID) | INTRAMUSCULAR | Status: DC
  Administered 2010-11-23 (×2): 3 mL via INTRAVENOUS

## 2010-11-22 MED ORDER — LACTATED RINGERS IV SOLN
INTRAVENOUS | Status: DC | PRN
  Administered 2010-11-22 (×2): via INTRAVENOUS

## 2010-11-22 MED ORDER — DEXTROSE 5 % IV SOLN
1.5000 g | Freq: Two times a day (BID) | INTRAVENOUS | Status: DC
  Filled 2010-11-22 (×4): qty 1.5

## 2010-11-22 MED ORDER — MORPHINE SULFATE 4 MG/ML IJ SOLN
2.0000 mg | INTRAMUSCULAR | Status: DC | PRN
  Administered 2010-11-22 – 2010-11-24 (×8): 2 mg via INTRAVENOUS
  Filled 2010-11-22 (×2): qty 1
  Filled 2010-11-22: qty 2
  Filled 2010-11-22 (×3): qty 1

## 2010-11-22 MED ORDER — PAPAVERINE HCL 30 MG/ML IJ SOLN
INTRAMUSCULAR | Status: DC | PRN
  Administered 2010-11-22: 60 mg via INTRAVENOUS

## 2010-11-22 MED ORDER — METOPROLOL TARTRATE 1 MG/ML IV SOLN
2.5000 mg | INTRAVENOUS | Status: DC | PRN
  Filled 2010-11-22: qty 5

## 2010-11-22 MED ORDER — ASPIRIN 81 MG PO CHEW
324.0000 mg | CHEWABLE_TABLET | Freq: Every day | ORAL | Status: DC
  Filled 2010-11-22: qty 4

## 2010-11-22 MED ORDER — ASPIRIN EC 325 MG PO TBEC
325.0000 mg | DELAYED_RELEASE_TABLET | Freq: Every day | ORAL | Status: DC
  Administered 2010-11-23 – 2010-11-28 (×6): 325 mg via ORAL
  Filled 2010-11-22 (×6): qty 1

## 2010-11-22 MED ORDER — OXYCODONE HCL 5 MG PO TABS
5.0000 mg | ORAL_TABLET | ORAL | Status: DC | PRN
  Filled 2010-11-22: qty 2

## 2010-11-22 MED ORDER — NITROGLYCERIN IN D5W 200-5 MCG/ML-% IV SOLN
0.0000 ug/min | INTRAVENOUS | Status: DC
  Filled 2010-11-22: qty 250

## 2010-11-22 MED ORDER — METOPROLOL TARTRATE 25 MG/10 ML ORAL SUSPENSION
12.5000 mg | Freq: Two times a day (BID) | ORAL | Status: DC
  Filled 2010-11-22 (×3): qty 5

## 2010-11-22 MED ORDER — LACTATED RINGERS IV SOLN
INTRAVENOUS | Status: DC | PRN
  Administered 2010-11-22: 07:00:00 via INTRAVENOUS

## 2010-11-22 MED ORDER — ACETAMINOPHEN 500 MG PO TABS
1000.0000 mg | ORAL_TABLET | Freq: Four times a day (QID) | ORAL | Status: AC
  Administered 2010-11-23 – 2010-11-27 (×17): 1000 mg via ORAL
  Filled 2010-11-22 (×24): qty 2
  Filled 2010-11-22: qty 1

## 2010-11-22 MED ORDER — SODIUM CHLORIDE 0.9 % IJ SOLN: 3.0000 mL | INTRAMUSCULAR | Status: DC | PRN

## 2010-11-22 MED ORDER — NON FORMULARY: Status: DC | PRN

## 2010-11-22 MED ORDER — PROPOFOL 10 MG/ML IV EMUL
INTRAVENOUS | Status: DC | PRN
  Administered 2010-11-22: 30 mg via INTRAVENOUS
  Administered 2010-11-22 (×3): 20 mg via INTRAVENOUS

## 2010-11-22 MED ORDER — NITROGLYCERIN IN D5W 200-5 MCG/ML-% IV SOLN
INTRAVENOUS | Status: DC | PRN
  Administered 2010-11-22: 16.6 ug/min via INTRAVENOUS

## 2010-11-22 MED ORDER — DEXTROSE 5 % IV SOLN
0.7500 g | INTRAVENOUS | Status: DC | PRN
  Administered 2010-11-22: .75 g via INTRAVENOUS

## 2010-11-22 MED ORDER — MAGNESIUM SULFATE 50 % IJ SOLN
4.0000 g | Freq: Once | INTRAVENOUS | Status: DC
  Filled 2010-11-22: qty 8

## 2010-11-22 MED ORDER — LIDOCAINE HCL (CARDIAC) 20 MG/ML IV SOLN
INTRAVENOUS | Status: DC | PRN
  Administered 2010-11-22: 50 mg via INTRAVENOUS

## 2010-11-22 MED ORDER — LACTATED RINGERS IV SOLN: 500.0000 mL | Freq: Once | INTRAVENOUS | Status: AC | PRN

## 2010-11-22 MED ORDER — METOPROLOL TARTRATE 12.5 MG HALF TABLET
12.5000 mg | ORAL_TABLET | Freq: Two times a day (BID) | ORAL | Status: DC
  Administered 2010-11-23: 12.5 mg via ORAL
  Filled 2010-11-22 (×3): qty 1

## 2010-11-22 MED ORDER — ACETAMINOPHEN 160 MG/5ML PO SOLN
975.0000 mg | Freq: Four times a day (QID) | ORAL | Status: AC
  Filled 2010-11-22: qty 40.6

## 2010-11-22 MED ORDER — PHENYLEPHRINE HCL 10 MG/ML IJ SOLN
0.0000 ug/min | INTRAMUSCULAR | Status: DC
  Filled 2010-11-22: qty 2

## 2010-11-22 MED ORDER — ACETAMINOPHEN 650 MG RE SUPP
975.0000 mg | Freq: Four times a day (QID) | RECTAL | Status: AC
  Filled 2010-11-22 (×20): qty 1

## 2010-11-22 MED ORDER — BISACODYL 10 MG RE SUPP
10.0000 mg | Freq: Every day | RECTAL | Status: DC
  Filled 2010-11-22 (×2): qty 1

## 2010-11-22 MED ORDER — SODIUM CHLORIDE 0.9 % IV SOLN
INTRAVENOUS | Status: DC
Start: 2010-11-22 — End: 2010-11-24

## 2010-11-22 MED ORDER — SODIUM CHLORIDE 0.9 % IV SOLN: 250.0000 mL | INTRAVENOUS | Status: DC

## 2010-11-22 MED ORDER — ACETAMINOPHEN 650 MG RE SUPP
650.0000 mg | RECTAL | Status: AC
  Administered 2010-11-22: 650 mg via RECTAL
  Filled 2010-11-22: qty 1

## 2010-11-22 MED ORDER — DEXTROSE 5 % IV SOLN
1.5000 g | INTRAVENOUS | Status: DC | PRN
  Administered 2010-11-22: 1.5 g via INTRAVENOUS

## 2010-11-22 MED ORDER — LACTATED RINGERS IV SOLN
INTRAVENOUS | Status: DC
  Administered 2010-11-22 (×2): via INTRAVENOUS

## 2010-11-22 MED ORDER — ALBUMIN HUMAN 5 % IV SOLN
INTRAVENOUS | Status: DC | PRN
  Administered 2010-11-22 (×4): via INTRAVENOUS

## 2010-11-22 MED ORDER — SODIUM CHLORIDE 0.9 % IV SOLN
0.1000 ug/kg/h | INTRAVENOUS | Status: DC
  Administered 2010-11-22: 0.7 ug/kg/h via INTRAVENOUS
  Filled 2010-11-22 (×2): qty 2

## 2010-11-22 MED ORDER — ROCURONIUM BROMIDE 100 MG/10ML IV SOLN
INTRAVENOUS | Status: DC | PRN
  Administered 2010-11-22 (×2): 50 mg via INTRAVENOUS
  Administered 2010-11-22: 100 mg via INTRAVENOUS

## 2010-11-22 MED ORDER — MORPHINE SULFATE 2 MG/ML IJ SOLN
1.0000 mg | INTRAMUSCULAR | Status: DC | PRN
  Filled 2010-11-22: qty 1

## 2010-11-22 MED ORDER — COAGULATION FACTOR VIIA RECOMB 1 MG IV SOLR
90.0000 ug/kg | INTRAVENOUS | Status: DC
  Filled 2010-11-22: qty 7

## 2010-11-22 MED ORDER — MAGNESIUM SULFATE 40 MG/ML IJ SOLN
INTRAMUSCULAR | Status: AC
  Administered 2010-11-22: 4 g
  Filled 2010-11-22: qty 100

## 2010-11-22 MED ORDER — FAMOTIDINE IN NACL 20-0.9 MG/50ML-% IV SOLN
20.0000 mg | Freq: Two times a day (BID) | INTRAVENOUS | Status: AC
  Administered 2010-11-22 (×2): 20 mg via INTRAVENOUS
  Filled 2010-11-22 (×3): qty 50

## 2010-11-22 MED ORDER — ASPIRIN EC 325 MG PO TBEC
325.0000 mg | DELAYED_RELEASE_TABLET | Freq: Every day | ORAL | Status: DC
  Filled 2010-11-22 (×2): qty 1

## 2010-11-22 MED ORDER — ACETAMINOPHEN 160 MG/5ML PO SOLN
650.0000 mg | ORAL | Status: AC
  Filled 2010-11-22: qty 20.3

## 2010-11-22 MED ORDER — SODIUM CHLORIDE 0.9 % IR SOLN
Status: DC | PRN
  Administered 2010-11-22: 3000 mL

## 2010-11-22 MED ORDER — DEXTROSE 5 % IV SOLN
10000.0000 ug | INTRAVENOUS | Status: DC | PRN
  Administered 2010-11-22 (×3): 25 ug/min via INTRAVENOUS

## 2010-11-22 MED ORDER — SODIUM CHLORIDE 0.9 % IV SOLN
10.0000 g | INTRAVENOUS | Status: DC | PRN
  Administered 2010-11-22: 5 g/h via INTRAVENOUS

## 2010-11-22 MED ORDER — ONDANSETRON HCL 4 MG/2ML IJ SOLN
4.0000 mg | Freq: Four times a day (QID) | INTRAMUSCULAR | Status: DC | PRN
  Administered 2010-11-23: 4 mg via INTRAVENOUS
  Filled 2010-11-22 (×2): qty 2

## 2010-11-22 MED ORDER — PANTOPRAZOLE SODIUM 40 MG PO TBEC
40.0000 mg | DELAYED_RELEASE_TABLET | Freq: Every day | ORAL | Status: DC
  Administered 2010-11-24 – 2010-11-28 (×5): 40 mg via ORAL
  Filled 2010-11-22 (×5): qty 1

## 2010-11-22 MED ORDER — SODIUM CHLORIDE 0.9 % IV SOLN
INTRAVENOUS | Status: DC
  Filled 2010-11-22: qty 1

## 2010-11-22 MED ORDER — HEPARIN SODIUM (PORCINE) 1000 UNIT/ML IJ SOLN
INTRAMUSCULAR | Status: DC | PRN
  Administered 2010-11-22: 2600 [IU] via INTRAVENOUS

## 2010-11-22 MED ORDER — PROTAMINE SULFATE 10 MG/ML IV SOLN
INTRAVENOUS | Status: DC | PRN
  Administered 2010-11-22: 260 mg via INTRAVENOUS

## 2010-11-22 MED ORDER — SODIUM CHLORIDE 0.9 % IJ SOLN
OROMUCOSAL | Status: DC | PRN
  Administered 2010-11-22 (×2): via TOPICAL

## 2010-11-22 SURGICAL SUPPLY — 150 items
ADAPTER CARDIO PERF ANTE/RETRO (ADAPTER) ×3 IMPLANT
ADPR PRFSN 84XANTGRD RTRGD (ADAPTER) ×1
APL SKNCLS STERI-STRIP NONHPOA (GAUZE/BANDAGES/DRESSINGS) ×2
APPLIER CLIP 9.375 MED OPEN (MISCELLANEOUS)
APPLIER CLIP 9.375 SM OPEN (CLIP)
ATTRACTOMAT 16X20 MAGNETIC DRP (DRAPES) ×3 IMPLANT
BAG DECANTER FOR FLEXI CONT (MISCELLANEOUS) ×3 IMPLANT
BAIR HUGGER STER HORSESHOE (MISCELLANEOUS) ×3 IMPLANT
BANDAGE ACE 4 STERILE (GAUZE/BANDAGES/DRESSINGS) ×3 IMPLANT
BANDAGE ELASTIC 4 VELCRO ST LF (GAUZE/BANDAGES/DRESSINGS) ×3 IMPLANT
BANDAGE ELASTIC 6 VELCRO ST LF (GAUZE/BANDAGES/DRESSINGS) ×3 IMPLANT
BANDAGE GAUZE ELAST BULKY 4 IN (GAUZE/BANDAGES/DRESSINGS) ×3 IMPLANT
BASKET HEART (ORDER IN 25'S) (MISCELLANEOUS)
BASKET HEART (ORDER IN 25S) (MISCELLANEOUS) IMPLANT
BENZOIN TINCTURE PRP APPL 2/3 (GAUZE/BANDAGES/DRESSINGS) ×3 IMPLANT
BLADE CORE FAN STRYKER (BLADE) ×3 IMPLANT
BLADE SAW SAG 29X58X.64 (BLADE) IMPLANT
BLADE STERNUM SYSTEM 6 (BLADE) IMPLANT
BLADE SURG 15 STRL LF DISP TIS (BLADE) ×2 IMPLANT
BLADE SURG 15 STRL SS (BLADE) ×1
BLADE SURG ROTATE 9660 (MISCELLANEOUS) ×3 IMPLANT
CANISTER SUCTION 2500CC (MISCELLANEOUS) ×3 IMPLANT
CANNULA AORTIC ROOT 20012 (MISCELLANEOUS) IMPLANT
CANNULA FEM VENOUS REMOTE 22FR (CANNULA) ×3 IMPLANT
CANNULA GUNDRY RCSP 15FR (MISCELLANEOUS) ×3 IMPLANT
CATH CPB KIT OWEN (MISCELLANEOUS) ×3 IMPLANT
CATH ROBINSON RED A/P 18FR (CATHETERS) ×9 IMPLANT
CATH THORACIC 28FR (CATHETERS) IMPLANT
CATH THORACIC 28FR RT ANG (CATHETERS) IMPLANT
CATH THORACIC 36FR (CATHETERS) ×3 IMPLANT
CATH THORACIC 36FR RT ANG (CATHETERS) ×6 IMPLANT
CLIP APPLIE 9.375 MED OPEN (MISCELLANEOUS) IMPLANT
CLIP APPLIE 9.375 SM OPEN (CLIP) IMPLANT
CLIP FOGARTY SPRING 6M (CLIP) IMPLANT
CLIP TI MEDIUM 24 (CLIP) IMPLANT
CLIP TI MEDIUM 6 (CLIP) IMPLANT
CLIP TI WIDE RED SMALL 24 (CLIP) IMPLANT
CLIP TI WIDE RED SMALL 6 (CLIP) IMPLANT
CLOTH BEACON ORANGE TIMEOUT ST (SAFETY) ×3 IMPLANT
CONN Y 3/8X3/8X3/8  BEN (MISCELLANEOUS)
CONN Y 3/8X3/8X3/8 BEN (MISCELLANEOUS) IMPLANT
COVER MAYO STAND STRL (DRAPES) ×3 IMPLANT
COVER SURGICAL LIGHT HANDLE (MISCELLANEOUS) ×12 IMPLANT
CRADLE DONUT ADULT HEAD (MISCELLANEOUS) ×3 IMPLANT
DRAIN CHANNEL 10F 3/8 F FF (DRAIN) ×3 IMPLANT
DRAIN CHANNEL 32F RND 10.7 FF (WOUND CARE) ×3 IMPLANT
DRAPE CARDIOVASCULAR INCISE (DRAPES) ×2
DRAPE EXTREMITY T 121X128X90 (DRAPE) ×3 IMPLANT
DRAPE PROXIMA HALF (DRAPES) ×3 IMPLANT
DRAPE SLUSH/WARMER DISC (DRAPES) ×3 IMPLANT
DRAPE SRG 135X102X78XABS (DRAPES) ×2 IMPLANT
DRSG COVADERM 4X14 (GAUZE/BANDAGES/DRESSINGS) ×3 IMPLANT
ELECT REM PT RETURN 9FT ADLT (ELECTROSURGICAL) ×6
ELECTRODE REM PT RTRN 9FT ADLT (ELECTROSURGICAL) ×4 IMPLANT
EVACUATOR SILICONE 100CC (DRAIN) ×3 IMPLANT
GAUZE KERLIX 2  STERILE LF (GAUZE/BANDAGES/DRESSINGS) ×3 IMPLANT
GAUZE SPONGE 4X4 12PLY STRL LF (GAUZE/BANDAGES/DRESSINGS) ×6 IMPLANT
GEL ULTRASOUND 20GR AQUASONIC (MISCELLANEOUS) IMPLANT
GLOVE BIO SURGEON STRL SZ 6 (GLOVE) IMPLANT
GLOVE BIO SURGEON STRL SZ 6.5 (GLOVE) ×12 IMPLANT
GLOVE BIO SURGEON STRL SZ7 (GLOVE) IMPLANT
GLOVE BIO SURGEON STRL SZ7.5 (GLOVE) ×9 IMPLANT
GLOVE BIOGEL M STER SZ 6 (GLOVE) ×6 IMPLANT
GLOVE BIOGEL PI IND STRL 6 (GLOVE) IMPLANT
GLOVE BIOGEL PI IND STRL 6.5 (GLOVE) ×4 IMPLANT
GLOVE BIOGEL PI IND STRL 7.0 (GLOVE) ×8 IMPLANT
GLOVE BIOGEL PI INDICATOR 6 (GLOVE)
GLOVE BIOGEL PI INDICATOR 6.5 (GLOVE) ×2
GLOVE BIOGEL PI INDICATOR 7.0 (GLOVE) ×4
GLOVE EUDERMIC 7 POWDERFREE (GLOVE) IMPLANT
GLOVE ORTHO TXT STRL SZ7.5 (GLOVE) ×6 IMPLANT
GOWN STRL NON-REIN LRG LVL3 (GOWN DISPOSABLE) ×12 IMPLANT
HARMONIC SHEARS 14CM COAG (MISCELLANEOUS) IMPLANT
HEMOSTAT POWDER SURGIFOAM 1G (HEMOSTASIS) ×12 IMPLANT
INSERT FOGARTY 61MM (MISCELLANEOUS) IMPLANT
INSERT FOGARTY XLG (MISCELLANEOUS) ×3 IMPLANT
KIT BASIN OR (CUSTOM PROCEDURE TRAY) ×3 IMPLANT
KIT DILATOR VASC 18G NDL (KITS) ×9 IMPLANT
KIT PAIN CUSTOM (MISCELLANEOUS) IMPLANT
KIT ROOM TURNOVER OR (KITS) ×6 IMPLANT
KIT SUCTION CATH 14FR (SUCTIONS) ×3 IMPLANT
KIT VASOVIEW W/TROCAR VH 2000 (KITS) ×3 IMPLANT
LEAD PACING MYOCARDI (MISCELLANEOUS) ×3 IMPLANT
MARKER GRAFT CORONARY BYPASS (MISCELLANEOUS) ×9 IMPLANT
NS IRRIG 1000ML POUR BTL (IV SOLUTION) ×12 IMPLANT
PACK OPEN HEART (CUSTOM PROCEDURE TRAY) ×3 IMPLANT
PAD DEFIB R2 (MISCELLANEOUS) ×3 IMPLANT
PENCIL BUTTON HOLSTER BLD 10FT (ELECTRODE) ×3 IMPLANT
PUNCH AORTIC ROTATE 4.0MM (MISCELLANEOUS) IMPLANT
PUNCH AORTIC ROTATE 4.5MM 8IN (MISCELLANEOUS) IMPLANT
PUNCH AORTIC ROTATE 5MM 8IN (MISCELLANEOUS) IMPLANT
SEALANT SURG COSEAL 4ML (VASCULAR PRODUCTS) ×3 IMPLANT
SET CARDIOPLEGIA MPS 5001102 (MISCELLANEOUS) ×3 IMPLANT
SOLUTION ANTI FOG 6CC (MISCELLANEOUS) ×3 IMPLANT
SPONGE GAUZE 4X4 12PLY (GAUZE/BANDAGES/DRESSINGS) ×6 IMPLANT
SPONGE GAUZE 4X4 STERILE 39 (GAUZE/BANDAGES/DRESSINGS) ×6 IMPLANT
SPONGE INTESTINAL PEANUT (DISPOSABLE) IMPLANT
SPONGE LAP 18X18 X RAY DECT (DISPOSABLE) ×21 IMPLANT
SPONGE LAP 4X18 X RAY DECT (DISPOSABLE) ×12 IMPLANT
STRIP CLOSURE SKIN 1/2X4 (GAUZE/BANDAGES/DRESSINGS) ×3 IMPLANT
SURGIFLO TRUKIT (HEMOSTASIS) ×3 IMPLANT
SUT BONE WAX W31G (SUTURE) ×6 IMPLANT
SUT ETHIBOND X763 2 0 SH 1 (SUTURE) ×6 IMPLANT
SUT MNCRL AB 3-0 PS2 18 (SUTURE) ×12 IMPLANT
SUT MNCRL AB 4-0 PS2 18 (SUTURE) IMPLANT
SUT PDS AB 1 CTX 36 (SUTURE) ×6 IMPLANT
SUT PDS AB 1 CTXB1 36 (SUTURE) ×6 IMPLANT
SUT PROLENE 1 CT (SUTURE) ×6 IMPLANT
SUT PROLENE 3 0 SH DA (SUTURE) ×21 IMPLANT
SUT PROLENE 3 0 SH1 36 (SUTURE) IMPLANT
SUT PROLENE 4 0 RB 1 (SUTURE)
SUT PROLENE 4 0 SH DA (SUTURE) IMPLANT
SUT PROLENE 4-0 RB1 .5 CRCL 36 (SUTURE) IMPLANT
SUT PROLENE 5 0 C 1 36 (SUTURE) ×3 IMPLANT
SUT PROLENE 6 0 C 1 30 (SUTURE) ×6 IMPLANT
SUT PROLENE 6 0 CC (SUTURE) IMPLANT
SUT PROLENE 7 0 BV 1 (SUTURE) IMPLANT
SUT PROLENE 7 0 BV1 MDA (SUTURE) ×3 IMPLANT
SUT PROLENE 7.0 RB 3 (SUTURE) ×18 IMPLANT
SUT PROLENE 8 0 BV175 6 (SUTURE) ×3 IMPLANT
SUT PROLENE BLUE 7 0 (SUTURE) ×6 IMPLANT
SUT PROLENE POLY MONO (SUTURE) IMPLANT
SUT SILK  1 MH (SUTURE) ×3
SUT SILK 1 MH (SUTURE) ×6 IMPLANT
SUT SILK 1 TIES 10X30 (SUTURE) ×3 IMPLANT
SUT SILK 2 0 SH CR/8 (SUTURE) IMPLANT
SUT SILK 3 0 SH CR/8 (SUTURE) ×3 IMPLANT
SUT STEEL 6MS V (SUTURE) IMPLANT
SUT STEEL STERNAL CCS#1 18IN (SUTURE) IMPLANT
SUT STEEL SZ 6 DBL 3X14 BALL (SUTURE) IMPLANT
SUT VIC AB 1 CTX 18 (SUTURE) ×9 IMPLANT
SUT VIC AB 1 CTX 36 (SUTURE)
SUT VIC AB 1 CTX36XBRD ANBCTR (SUTURE) IMPLANT
SUT VIC AB 2-0 CT1 27 (SUTURE) ×2
SUT VIC AB 2-0 CT1 TAPERPNT 27 (SUTURE) ×4 IMPLANT
SUT VIC AB 2-0 CTX 27 (SUTURE) IMPLANT
SUT VIC AB 3-0 SH 27 (SUTURE)
SUT VIC AB 3-0 SH 27X BRD (SUTURE) IMPLANT
SUT VIC AB 3-0 X1 27 (SUTURE) ×6 IMPLANT
SUTURE E-PAK OPEN HEART (SUTURE) ×3 IMPLANT
SYR 50ML SLIP (SYRINGE) IMPLANT
SYSTEM SAHARA CHEST DRAIN ATS (WOUND CARE) ×6 IMPLANT
TAPE CLOTH SURG 4X10 WHT LF (GAUZE/BANDAGES/DRESSINGS) ×6 IMPLANT
TOWEL OR 17X24 6PK STRL BLUE (TOWEL DISPOSABLE) ×6 IMPLANT
TOWEL OR 17X26 10 PK STRL BLUE (TOWEL DISPOSABLE) ×6 IMPLANT
TRAY FOLEY IC TEMP SENS 14FR (CATHETERS) ×3 IMPLANT
TUBE SUCT INTRACARD DLP 20F (MISCELLANEOUS) ×3 IMPLANT
TUBING INSUFFLATION 10FT LAP (TUBING) ×3 IMPLANT
UNDERPAD 30X30 INCONTINENT (UNDERPADS AND DIAPERS) ×6 IMPLANT
WATER STERILE IRR 1000ML POUR (IV SOLUTION) ×6 IMPLANT

## 2010-11-22 NOTE — Anesthesia Postprocedure Evaluation (Signed)
  Anesthesia Post-op Note  Patient: Buyer, retail  Procedure(s) Performed:  CORONARY ARTERY BYPASS GRAFTING (CABG)TIMES TWO - using endoscopically harvested right saphenous vein and left radial artery; Transesophageal echocardiogram; RADIAL ARTERY HARVEST  Patient Location: SICU  Anesthesia Type: General  Level of Consciousness: awake  Airway and Oxygen Therapy: Patient placed on Ventilator (see vital sign flow sheet for setting)  Post-op Pain: mild  Post-op Assessment: Patient's Cardiovascular Status Stable  Post-op Vital Signs: stable  Complications: No apparent anesthesia complications

## 2010-11-22 NOTE — H&P (Signed)
Patient seen and examined.  No changes from yesterday.  For redo CABG today.

## 2010-11-22 NOTE — Anesthesia Procedure Notes (Addendum)
Procedures

## 2010-11-22 NOTE — Procedures (Signed)
Extubation Procedure Note  Patient Details:   Name: Jacob Hampton DOB: 08/10/1924 MRN: [id number]   Airway Documentation:  AIRWAYS 8 mm (Active)  Secured at (cm) 22 cm 11/22/2010 12:00 AM     AIRWAYS (Active)     Airway 8 mm (Active)  Secured at (cm) 22 cm 11/22/2010 10:20 PM  Measured From Lips 11/22/2010 10:20 PM  Secured Location Right 11/22/2010 10:20 PM  Secured By Pink Tape 11/22/2010 10:20 PM  Site Condition Dry 11/22/2010 10:20 PM    Evaluation  O2 sats: stable throughout Complications: No apparent complications Patient did tolerate procedure well. Bilateral Breath Sounds: Clear   Yes  Lovings, Shon Hale 11/22/2010, 11:06 PM Extubated pt. To a 4L [place] without any complications, dyspnea or stridor noted. Pt. Was instructed on IS X 5. Highest goal achieved was . Pt, achieved a NIF of -40 and Vc of 1.5L. Pt. Is stable and doing well at this time. All vitals remain WNL.

## 2010-11-22 NOTE — OR Nursing (Signed)
Physician placed 4 small red robinson catheter pieces on anterior chest

## 2010-11-22 NOTE — Anesthesia Preprocedure Evaluation (Addendum)
Anesthesia Evaluation  Patient identified by MRN, date of birth, ID band Patient awake    Reviewed: Allergy & Precautions, H&P , NPO status , Patient's Chart, lab work & pertinent test results, reviewed documented beta blocker date and time   Airway Mallampati: II  Neck ROM: full    Dental  (+) Poor Dentition and Dental Advisory Given   Pulmonary pneumonia ,  clear to auscultation        Cardiovascular hypertension, Pt. on medications + angina with exertion + CAD regular     Neuro/Psych    GI/Hepatic GERD-  ,  Endo/Other    Renal/GU      Musculoskeletal   Abdominal   Peds  Hematology   Anesthesia Other Findings   Reproductive/Obstetrics                       Anesthesia Physical Anesthesia Plan  ASA: III  Anesthesia Plan: General   Post-op Pain Management:    Induction: Intravenous  Airway Management Planned: Oral ETT  Additional Equipment: 3D TEE, TEE and PA Cath  Intra-op Plan:   Post-operative Plan:   Informed Consent: I have reviewed the patients History and Physical, chart, labs and discussed the procedure including the risks, benefits and alternatives for the proposed anesthesia with the patient or authorized representative who has indicated his/her understanding and acceptance.   Dental advisory given  Plan Discussed with: CRNA, Anesthesiologist and Surgeon  Anesthesia Plan Comments:        Anesthesia Quick Evaluation

## 2010-11-22 NOTE — Transfer of Care (Signed)
Immediate Anesthesia Transfer of Care Note  Patient: Buyer, retail  Procedure(s) Performed:  CORONARY ARTERY BYPASS GRAFTING (CABG)TIMES TWO - using endoscopically harvested right saphenous vein and left radial artery; Transesophageal echocardiogram; RADIAL ARTERY HARVEST  Patient Location: PACU and SICU  Anesthesia Type: General  Level of Consciousness: sedated  Airway & Oxygen Therapy: Patient remains intubated per anesthesia plan and Patient placed on Ventilator (see vital sign flow sheet for setting)  Post-op Assessment: Report given to PACU RN and Post -op Vital signs reviewed and stable  Post vital signs: stable  Complications: No apparent anesthesia complications

## 2010-11-22 NOTE — Preoperative (Signed)
Beta Blockers   Reason not to administer Beta Blockers:Not Applicable 

## 2010-11-22 NOTE — Brief Op Note (Signed)
11/22/2010  11:28 AM  PATIENT:  Jacob Hampton  77 y.o. male  PRE-OPERATIVE DIAGNOSIS:  cornary artery disease  POST-OPERATIVE DIAGNOSIS:  Coronary artery disease  PROCEDURE:  Procedure(s): REDO CORONARY ARTERY BYPASS GRAFTING (CABG)TIMES two with Left RADIAL ARTERY HARVEST. Radial artery to OM, and SVG to PDA EVH R LEG WT. 75 KG  SURGEON:  Surgeon(s): Purcell Nails, MD  PHYSICIAN ASSISTANT: Gershon Crane PA-C  ASSISTANTS:  Al Corpus SA  ANESTHESIA:   general     EBL:  Total I/O In: 1550 [I.V.:1550] Out: 650 [Urine:650]  BLOOD ADMINISTERED:see anesthesia and perfusion records CC CELLSA[facility]  DRAINS: Urinary Catheter (Foley) and 3 Chest Tube(s) in the chest/mediastinum   LOCAL MEDICATIONS USED:  NONE  SPECIMEN:  No Specimen  DISPOSITION OF SPECIMEN:  N/A  COUNTS:  YES  TOURNIQUET:  NA  DICTATION: .Dragon Dictation  PLAN OF CARE: Admit to inpatient   PATIENT DISPOSITION:  To SICU stablecondition   Delay start of Pharmacological VTE agent (>24hrs) due to surgical blood loss or risk of bleeding:  yes

## 2010-11-22 NOTE — Op Note (Signed)
OPERATIVE NOTE  Date of Procedure: 11/22/2010  Preoperative Diagnosis: Severe left main disease with three-vessel coronary artery disease and vein graft disease status post coronary artery bypass grafting in 2001 Postoperative Diagnosis: Same  Procedure: Redo Coronary Artery Bypass Grafting x 2 using:  Left Radial Artery to Obtuse Marginal Branch of Left Circumflex Coronary Artery  Saphenous Vein Graft to Posterior Descending Coronary Artery  Endoscopic Vein Harvest from Right Thigh and Leg  Surgeon: Salvatore Decent. Cornelius Moras, MD Assistant: Gershon Crane, PA-C Second Assistant: Al Corpus, CSFA Anesthesia: Judie Petit, MD  Brief Clinical Note and Indications for Surgery:   The patient is an 77 year old male who underwent coronary artery bypass grafting x5 in 2001. He presents with recent onset angina pectoris. Cardiac catheterization demonstrates severe left main disease with three-vessel coronary artery disease and normal left ventricular function. All of the previous vein grafts performed in 2001 were occluded. The left internal mammary artery graft placed in 2001 remained widely patent and without disease. A full consultation note has been dictated previously and the patient provides full informed consent for the operation as described.  Operative Findings:  #1. Normal left ventricular function  #2. Good quality small caliber left radial artery conduit  #3. Small caliber poor quality saphenous vein conduit.  #4. Fair quality target vessels for grafting.  #5. Severe coagulopathy after separation from bypass and reversal of heparin with protamine.  Operative Procedure in Detail:  The patient is brought to the operating room on the above mentioned date and placed in the supine position on the operating table. Central monitoring was established by the anesthesia team including placement of Swan-Ganz catheter and radial arterial line. Intravenous antibiotics are administered. General  endotracheal anesthesia is induced uneventfully. A Foley catheter is placed.  Baseline transesophageal echocardiogram was performed.  Findings were notable for normal left ventricular function with trivial mitral regurgitation and no other significant abnormalities.  The patient's chest, left upper extremity, abdomen, both groins, and both lower extremities are prepared and draped in a sterile manner. A time out procedure is performed.  The greater saphenous vein is removed from the patient's right thigh and the upper portion of the right lower leg using endoscopic vein harvest technique. The saphenous vein is fairly small caliber and relatively poor conduit. Ultimately a portion of saphenous vein that is felt to be acceptable for a single vein graft is removed. The surgical incisions in the lower extremity are closed with absorbable suture. The left radial artery is removed from the patient's left forearm through a longitudinal incision on the volar aspect of the forearm. The left radial artery is somewhat small and prone to spasm but overall reasonably good quality conduit. Following systemic heparinization left radial arteries removed and the proximal and distal stumps were oversewn. The incision in the form is closed in layers in routine fashion.  A  redo median sternotomy incision was performed.  The patient's sternum is divided with a sagittal saw. Sternal entry was uneventful. Dissection was performed to identify the anterior surface of the ascending aorta.  The pericardium is opened. A long femoral venous cannula is placed through the right common femoral vein using the Seldinger technique and advanced under transesophageal echocardiogram guidance through the right atrium. The ascending aorta is moderately sclerotic in appearance. The ascending aorta is cannulated for cardioplegia bypass.  Adequate heparinization is verified.  A retrograde cardioplegia cannula is placed through the right atrium  into the coronary sinus.  Cardiopulmonary bypass was begun and the surface of  the heart is dissected further.  Vacuum assist venous drainage is utilized. Distal target vessels are selected for coronary artery bypass grafting. A cardioplegia cannula is placed in the ascending aorta.  A temperature probe was placed in the interventricular septum.  The patent left internal mammary artery graft is identified.  The patient is cooled to 32C systemic temperature.  A bulldog clamp is placed on the left internal mammary artery graft. The aortic cross clamp is applied and cold blood cardioplegia is delivered in an antegrade fashion through the aortic root and retrograde through the coronary sinus catheter.  Iced saline slush is applied for topical hypothermia.  The initial cardioplegic arrest is rapid with early diastolic arrest.  Repeat doses of cardioplegia are administered intermittently throughout the entire cross clamp portion of the operation though the aortic root, down subsequently placed vein grafts, and through the coronary sinus catheter in order to maintain completely flat electrocardiogram and septal myocardial temperature below 15C.  Myocardial protection was felt to be excellent.  The following distal coronary artery bypass grafts were performed:  #1 the obtuse marginal branch of the left circumflex coronary artery is grafted with the left radial artery in end to side fashion. This vessel measured 1.2 mm in diameter and is of fair quality target vessel at the site of distal grafting.  #2 the posterior descending coronary artery is graft with a saphenous vein graft in end-to-side fashion. This vessel measured 1.6 mm in diameter and was of fair to good quality target vessel at the site of distal grafting.  The proximal vein graft and radial artery grafts were placed directly to the ascending aorta prior to removal of the aortic cross clamp. The proximal end of the radial artery graft was constructed  with a very short interposition segment of saphenous vein do to size mismatch between the large sclerotic aorta and the small caliber radial artery graft. The septal myocardial temperature rose rapidly after reperfusion of the left internal mammary artery graft.  The aortic cross clamp was removed after a total cross clamp time of 63 minutes.  All proximal and distal coronary anastomoses were inspected for hemostasis and appropriate graft orientation. Epicardial pacing wires are fixed to the right ventricular outflow tract and to the right atrial appendage. The patient is rewarmed to 37C temperature. The patient is weaned and disconnected from cardiopulmonary bypass.  The patient's rhythm at separation from bypass was normal sinus rhythm.  The patient was weaned from cardioplegic bypass without any inotropic support. Total cardiopulmonary bypass time for the operation was 105 minutes.  Followup transesophageal echocardiogram performed after separation from bypass revealed no changes from preoperatively.  The aortic and venous cannula were removed uneventfully. Protamine was administered to reverse the anticoagulation. There was severe coagulopathy. The patient was kept in the operating room and resuscitated with blood products until the coagulopathy improved. This required transfusion of platelets, fresh frozen plasma, and cryoprecipitate. The patient was also administered 1 dose of recombinant factor VII. The patient stayed in the operating room for more than 2-1/2 hours after separation from bypass until the coagulopathy resolved.  The mediastinum and pleural space were inspected for hemostasis and irrigated with saline solution. Mediastinum and both pleural spaces were drained using 4 chest tubes placed through separate stab incisions inferiorly. The sternum is closed with double strength sternal wire. The soft tissues at the inferior end of the sternotomy incision could not be reapproximated in the  usual manner do to the patient's extremely thin anterior chest  wall with severe scarring from his original surgery. The upper portion of the sternotomy was normal. In order to close the lower portion, the soft tissues were undermined off of the anterior surface of the xiphoid process in the terminal portion of the sternum in either direction a short distance. This space was drained with a small Jackson-Pratt drain and closed with retention sutures. The remainder of the incision was closed with a deep layer followed by interrupted vertical mattress nonabsorbable Prolene suture.  The patient tolerated the procedure well and is transported to the surgical intensive care in stable condition. There are no intraoperative complications. All sponge instrument and needle counts are verified correct at completion of the operation.  Salvatore Decent. Cornelius Moras MD

## 2010-11-23 ENCOUNTER — Other Ambulatory Visit: Payer: Self-pay

## 2010-11-23 ENCOUNTER — Other Ambulatory Visit: Payer: Self-pay | Admitting: Thoracic Surgery (Cardiothoracic Vascular Surgery)

## 2010-11-23 ENCOUNTER — Inpatient Hospital Stay (HOSPITAL_COMMUNITY): Payer: Medicare Other

## 2010-11-23 ENCOUNTER — Encounter (HOSPITAL_COMMUNITY): Payer: Self-pay | Admitting: Cardiology

## 2010-11-23 LAB — PREPARE CRYOPRECIPITATE: Unit division: 0

## 2010-11-23 LAB — POCT I-STAT, CHEM 8
BUN: 19 mg/dL (ref 6–23)
Calcium, Ion: 1.03 mmol/L — ABNORMAL LOW (ref 1.12–1.32)
Creatinine, Ser: 1 mg/dL (ref 0.50–1.35)
Glucose, Bld: 128 mg/dL — ABNORMAL HIGH (ref 70–99)
Hemoglobin: 9.9 g/dL — ABNORMAL LOW (ref 13.0–17.0)
TCO2: 21 mmol/L (ref 0–100)

## 2010-11-23 LAB — CBC
MCH: 30.5 pg (ref 26.0–34.0)
MCHC: 35.3 g/dL (ref 30.0–36.0)
MCHC: 34.5 g/dL (ref 30.0–36.0)
MCV: 88.1 fL (ref 78.0–100.0)
Platelets: 116 10*3/uL — ABNORMAL LOW (ref 150–400)
RDW: 16.7 % — ABNORMAL HIGH (ref 11.5–15.5)
RDW: 17.1 % — ABNORMAL HIGH (ref 11.5–15.5)
WBC: 7.2 10*3/uL (ref 4.0–10.5)

## 2010-11-23 LAB — PREPARE FRESH FROZEN PLASMA
Unit division: 0
Unit division: 0

## 2010-11-23 LAB — CREATININE, SERUM
Creatinine, Ser: 1 mg/dL (ref 0.50–1.35)
GFR calc non Af Amer: 67 mL/min — ABNORMAL LOW (ref >90)

## 2010-11-23 LAB — GLUCOSE, CAPILLARY
Glucose-Capillary: 106 mg/dL — ABNORMAL HIGH (ref 70–99)
Glucose-Capillary: 108 mg/dL — ABNORMAL HIGH (ref 70–99)
Glucose-Capillary: 108 mg/dL — ABNORMAL HIGH (ref 70–99)
Glucose-Capillary: 123 mg/dL — ABNORMAL HIGH (ref 70–99)
Glucose-Capillary: 99 mg/dL (ref 70–99)

## 2010-11-23 LAB — BASIC METABOLIC PANEL
Calcium: 7.4 mg/dL — ABNORMAL LOW (ref 8.4–10.5)
Chloride: 108 mEq/L (ref 96–112)
Creatinine, Ser: 0.82 mg/dL (ref 0.50–1.35)
GFR calc Af Amer: >90 mL/min (ref >90)
GFR calc non Af Amer: 79 mL/min — ABNORMAL LOW (ref >90)

## 2010-11-23 LAB — PREPARE PLATELET PHERESIS
Unit division: 0
Unit division: 0

## 2010-11-23 LAB — MAGNESIUM
Magnesium: 2.6 mg/dL — ABNORMAL HIGH (ref 1.5–2.5)
Magnesium: 2.4 mg/dL (ref 1.5–2.5)

## 2010-11-23 MED ORDER — DEXTROSE 5 % IV SOLN
60.0000 mg/h | INTRAVENOUS | Status: AC
  Administered 2010-11-23: 60 mg/h via INTRAVENOUS
  Filled 2010-11-23 (×2): qty 9

## 2010-11-23 MED ORDER — INSULIN ASPART 100 UNIT/ML ~~LOC~~ SOLN
0.0000 [IU] | SUBCUTANEOUS | Status: DC
  Filled 2010-11-23: qty 3

## 2010-11-23 MED ORDER — FOLIC ACID 1 MG PO TABS
1.0000 mg | ORAL_TABLET | Freq: Every day | ORAL | Status: DC
  Administered 2010-11-23 – 2010-11-28 (×6): 1 mg via ORAL
  Filled 2010-11-23 (×6): qty 1

## 2010-11-23 MED ORDER — INSULIN ASPART 100 UNIT/ML ~~LOC~~ SOLN
0.0000 [IU] | SUBCUTANEOUS | Status: DC
  Administered 2010-11-23: 2 [IU] via SUBCUTANEOUS
  Administered 2010-11-23: 0 [IU] via SUBCUTANEOUS
  Administered 2010-11-23 – 2010-11-24 (×5): 2 [IU] via SUBCUTANEOUS
  Filled 2010-11-23: qty 3

## 2010-11-23 MED ORDER — SODIUM CHLORIDE 0.9 % IV SOLN
INTRAVENOUS | Status: DC | PRN
  Filled 2010-11-23: qty 1

## 2010-11-23 MED ORDER — PHENYLEPHRINE HCL 10 MG/ML IJ SOLN
0.0000 ug/min | INTRAVENOUS | Status: DC
  Filled 2010-11-23: qty 1

## 2010-11-23 MED ORDER — METOPROLOL TARTRATE 25 MG PO TABS
25.0000 mg | ORAL_TABLET | Freq: Two times a day (BID) | ORAL | Status: DC
  Administered 2010-11-24 – 2010-11-28 (×9): 25 mg via ORAL
  Filled 2010-11-23 (×11): qty 1

## 2010-11-23 MED ORDER — DEXTROSE 5 % IV SOLN
30.0000 mg/h | INTRAVENOUS | Status: AC
  Administered 2010-11-24: 30 mg/h via INTRAVENOUS
  Filled 2010-11-23 (×2): qty 9

## 2010-11-23 MED ORDER — METOPROLOL TARTRATE 25 MG/10 ML ORAL SUSPENSION: 12.5000 mg | Freq: Two times a day (BID) | ORAL | Status: DC

## 2010-11-23 MED ORDER — ROSUVASTATIN CALCIUM 20 MG PO TABS
20.0000 mg | ORAL_TABLET | Freq: Every day | ORAL | Status: DC
  Administered 2010-11-23 – 2010-11-27 (×5): 20 mg via ORAL
  Filled 2010-11-23 (×6): qty 1

## 2010-11-23 MED ORDER — POTASSIUM CHLORIDE 10 MEQ/50ML IV SOLN
10.0000 meq | INTRAVENOUS | Status: AC
  Administered 2010-11-23 (×2): 10 meq via INTRAVENOUS

## 2010-11-23 MED ORDER — BOOST / RESOURCE BREEZE PO LIQD
1.0000 | Freq: Three times a day (TID) | ORAL | Status: DC
  Administered 2010-11-24 – 2010-11-27 (×8): 1 via ORAL

## 2010-11-23 MED ORDER — FUROSEMIDE 10 MG/ML IJ SOLN
20.0000 mg | Freq: Four times a day (QID) | INTRAMUSCULAR | Status: AC
  Administered 2010-11-23 – 2010-11-24 (×3): 20 mg via INTRAVENOUS
  Filled 2010-11-23 (×2): qty 2

## 2010-11-23 MED ORDER — AMIODARONE LOAD VIA INFUSION
150.0000 mg | Freq: Once | INTRAVENOUS | Status: AC
  Administered 2010-11-23: 150 mg via INTRAVENOUS
  Filled 2010-11-23: qty 151.2

## 2010-11-23 NOTE — Progress Notes (Signed)
1 Day Post-Op Procedure(s) (LRB): CORONARY ARTERY BYPASS GRAFTING (CABG)TIMES TWO (N/A) RADIAL ARTERY HARVEST (Left) Subjective: Mr. Maceachern is doing remarkably well.  Mild soreness in chest.  Breathing comfortably.  Objective: Vital signs in last 24 hours: Temp:  [94.3 F (34.6 C)-99 F (37.2 C)] 99 F (37.2 C) (11/07 0700) Pulse Rate:  [79-82] 80  (11/07 0700) Cardiac Rhythm:  [-] Atrial paced (11/07 0700) Resp:  [8-41] 41  (11/07 0700) BP: (102)/(56) 102/56 mmHg (11/06 1926) SpO2:  [93 %-100 %] 99 % (11/07 0700) FiO2 (%):  [40 %-50 %] 40 % (11/06 2220) Weight:  [80 kg (176 lb 5.9 oz)] 176 lb 5.9 oz (80 kg) (11/07 0600)  Physical Exam: Sinus rhythm Stable BP Breath sounds clear Abdomen soft, non-distended, non-tender Extremities warm, well-perfused  Intake/Output from previous day: 11/06 0701 - 11/07 0700 In: 8418.9 [I.V.:3084.9; AVWUJ:8119; NG/GT:60; IV Piggyback:952] Out: 5790 [Urine:3160; Drains:1080; Blood:1550] Intake/Output this shift:    Lab Results:  Basename 11/23/10 0400 11/22/10 2157 11/22/10 2150  WBC 7.2 -- 6.8  HGB 10.2* 9.2* --  HCT 28.9* 27.0* --  PLT 116* -- 121*   BMET:  Basename 11/23/10 0400 11/22/10 2157  NA 139 139  K 3.9 3.9  CL 108 109  CO2 24 --  GLUCOSE 119* 109*  BUN 16 15  CREATININE 0.82 0.90  CALCIUM 7.4* --    PT/INR:  Basename 11/22/10 1627  LABPROT 11.9  INR 0.86   CBG (last 3)   Basename 11/23/10 0616 11/23/10 0406 11/23/10 0259  GLUCAP 106* 108* 108*    Assessment/Plan: S/P Procedure(s) (LRB): CORONARY ARTERY BYPASS GRAFTING (CABG)TIMES TWO (N/A) RADIAL ARTERY HARVEST (Left) Doing very well POD #1 redo CABG x2 Mobilize, diuresis, d/c tubes and lines, Imdur for RA graft

## 2010-11-23 NOTE — Progress Notes (Signed)
1 Day Post-Op Procedure(s) (LRB): RADIAL ARTERY HARVEST (Left) REDO CORONARY ARTERY BYPASS GRAFTING (CABG)TIMES TWO (N/A) Subjective:                           [address]            [place] [address]          [phone]    comfortable in bed  Objective: Vital signs in last 24 hours: Temp:  [97.7 F (36.5 C)-99.1 F (37.3 C)] 99.1 F (37.3 C) (11/07 1300) Pulse Rate:  [79-96] 95  (11/07 1500) Cardiac Rhythm:  [-] Normal sinus rhythm (11/07 0800) Resp:  [5-41] 13  (11/07 1500) SpO2:  [93 %-100 %] 98 % (11/07 1300) FiO2 (%):  [40 %-50 %] 40 % (11/06 2220) Weight:  [176 lb 5.9 oz (80 kg)] 176 lb 5.9 oz (80 kg) (11/07 0600)  Hemodynamic parameters for last 24 hours: PAP: (22-55)/(9-36) 34/18 mmHg CO:  [3.4 L/min-3.8 L/min] 3.8 L/min CI:  [1.8 L/min/m2-2 L/min/m2] 2 L/min/m2  Intake/Output from previous day: 11/06 0701 - 11/07 0700 In: 8433.2 [I.V.:3099.2; QMVHQ:4696; NG/GT:60; IV Piggyback:952] Out: 5790 [Urine:3160; Drains:1080; Blood:1550] Intake/Output this shift:    Lungs clear, NSR min tube draiage  Lab Results:  Basename 11/23/10 1718 11/23/10 1700 11/23/10 0400  WBC -- 9.5 7.2  HGB 9.9* 11.0* --  HCT 29.0* 31.9* --  PLT -- 136* 116*   BMET:  Basename 11/23/10 1718 11/23/10 1700 11/23/10 0400  NA 139 -- 139  K 4.1 -- 3.9  CL 108 -- 108  CO2 -- -- 24  GLUCOSE 128* -- 119*  BUN 19 -- 16  CREATININE 1.00 1.00 --  CALCIUM -- -- 7.4*    PT/INR:  Basename 11/22/10 1627  LABPROT 11.9  INR 0.86   ABG    Component Value Date/Time   PHART 7.404 11/22/2010 2246   HCO3 24.0 11/22/2010 2246   TCO2 21 11/23/2010 1718   ACIDBASEDEF 1.0 11/22/2010 2246   O2SAT 97.0 11/22/2010 2246   CBG (last 3)   Basename 11/23/10 1553 11/23/10 1142 11/23/10 0616  GLUCAP 123* 112* 106*    Assessment/Plan: S/P Procedure(s) (LRB): RADIAL ARTERY HARVEST (Left) REDO CORONARY ARTERY BYPASS GRAFTING (CABG)TIMES TWO (N/A) Continue current care   LOS: 1 day      VAN TRIGT Hampton,Jacob Zynda 11/23/2010

## 2010-11-23 NOTE — Progress Notes (Addendum)
INITIAL ADULT NUTRITION ASSESSMENT Date: 11/23/2010   Time: 11:23 AM  Reason for Assessment: Low Braden  ASSESSMENT: Male 77 y.o.  Dx: CAD (coronary artery disease), s/p CABG   Hx:  Past Medical History  Diagnosis Date  . Hyperlipidemia 11/16/2010  . History of upper GI bleeding 11/16/2010  . History of primary tuberculosis   . CAD (coronary artery disease) 11/16/2010    DR. TILLEY IS HIS CARDIOLOGIST  . Angina     INTERMITTENT CP - TAKES NTG PRN  . Blood transfusion     HAD 8 UNITS FOR GI BLEED AT LEAST 10 YRS AGO  . Pneumonia     H/O WALKING PNEUMONIA AT LEAST 20 YRS AGO  . GERD (gastroesophageal reflux disease)     H/O GI BLEED  . Hypertension      ekg, echo and office notes requested from Dr. York Spaniel office  . Tuberculosis 1969    WAS IN [facility] FOR 1 YR   Related Meds: zofran, albumin human 5% solution, protonix, novolog, pepcid, abx  Ht: 5' 6.93" (170 cm)  Wt: 176 lb 5.9 oz (80 kg)  Ideal Wt: 67.2 kg % Ideal Wt: 119%  Usual Wt: --- % Usual Wt: ---  Body mass index is 27.68 kg/(m^2).  Food/Nutrition Related Hx: Regular diet PTA  Labs: Sodium 139 mEq/L      Potassium 3.9 mEq/L      Chloride 108 mEq/L      CO2 24 mEq/L      Glucose, Bld 119 mg/dL H     BUN 16 mg/dL      Creatinine, Ser 3.66 mg/dL      Calcium 7.4 mg/dL L   I/O last 3 completed shifts: In: 8418.9 [I.V.:3084.9; Blood:4322; NG/GT:60; IV Piggyback:952] Out: 5790 [Urine:3160; Drains:1080; Blood:1550]    Diet Order: Clear Liquids  Supplements/Tube Feeding: N/A  IVF:    sodium chloride Last Rate: 20 mL/hr at 11/23/10 0643  sodium chloride   sodium chloride   dexmedetomidine (PRECEDEX) IV infusion Last Rate: Stopped (11/22/10 2145)  insulin (NOVOLIN-R) infusion Last Rate: Stopped (11/23/10 0407)  insulin (NOVOLIN-R) infusion   lactated ringers Last Rate: 20 mL/hr at 11/23/10 0643  nitroGLYCERIN Last Rate: 5 mcg/min (11/23/10 0643)  phenylephrine (NEO-SYNEPHRINE) infusion      Estimated Nutritional Needs:   Kcal: 2,000-2,200 Protein: 100-110 gms Fluid: 2.0-2.2 L   Patient s/p CABG x2, radial artery harvest (left); RD spoke with patient re: nutrition hx -- states appetite very good PTA; no reported weight loss; pt had risk for skin breakdown due to low braden score; would benefit for addition of supplements for skin integrity and post-op healing.  NUTRITION DIAGNOSIS: -Increased nutrient needs (NI-5.1).  Status: Ongoing  RELATED TO: post-op healing  AS EVIDENCE BY: estimated nutrition needs, s/p CABG  MONITORING/EVALUATION(Goals): Goal: pt to meet > 90% of estimated nutrition needs with meals  Monitor: PO intake, weight, labs, skin integrity  EDUCATION NEEDS: -No education needs at this time  INTERVENTION: 1. Advance diet as medically appropriate 2. Resource Breeze supplement PO TID  RD to follow for nutrition care plan.  Dietitian [phone]  DOCUMENTATION CODES Per approved criteria  -Not Applicable    Waylan Boga Federkiewicz 11/23/2010, 11:23 AM

## 2010-11-23 NOTE — OR Nursing (Signed)
Primary procedure changed due to error in documentation on 11/22/2010

## 2010-11-23 NOTE — Progress Notes (Signed)
1 Day Post-Op Procedure(s) (LRB): RADIAL ARTERY HARVEST (Left) REDO CORONARY ARTERY BYPASS GRAFTING (CABG)TIMES TWO (N/A) Subjective: Mr. Westfall looks great.  Objective: Vital signs in last 24 hours: Temp:  [94.3 F (34.6 C)-99.1 F (37.3 C)] 99.1 F (37.3 C) (11/07 1300) Pulse Rate:  [79-96] 95  (11/07 1500) Cardiac Rhythm:  [-] Normal sinus rhythm (11/07 0800) Resp:  [5-41] 13  (11/07 1500) BP: (102)/(56) 102/56 mmHg (11/06 1926) SpO2:  [93 %-100 %] 98 % (11/07 1300) FiO2 (%):  [40 %-50 %] 40 % (11/06 2220) Weight:  [80 kg (176 lb 5.9 oz)] 176 lb 5.9 oz (80 kg) (11/07 0600)  Physical Exam: Unchanged from this morning  Intake/Output from previous day: 11/06 0701 - 11/07 0700 In: 8433.2 [I.V.:3099.2; GEXBM:8413; NG/GT:60; IV Piggyback:952] Out: 5790 [Urine:3160; Drains:1080; Blood:1550] Intake/Output this shift: Total I/O In: 530 [P.O.:180; I.V.:300; IV Piggyback:50] Out: 310 [Urine:230; Drains:80]  Lab Results:  [facility] 11/23/10 0400 11/22/10 2157 11/22/10 2150  WBC 7.2 -- 6.8  HGB 10.2* 9.2* --  HCT 28.9* 27.0* --  PLT 116* -- 121*   BMET:  Basename 11/23/10 0400 11/22/10 2157  NA 139 139  K 3.9 3.9  CL 108 109  CO2 24 --  GLUCOSE 119* 109*  BUN 16 15  CREATININE 0.82 0.90  CALCIUM 7.4* --    PT/INR:  Basename 11/22/10 1627  LABPROT 11.9  INR 0.86   CBG (last 3)   Basename 11/23/10 1142 11/23/10 0616 11/23/10 0406  GLUCAP 112* 106* 108*    Assessment/Plan: S/P Procedure(s) (LRB): RADIAL ARTERY HARVEST (Left) REDO CORONARY ARTERY BYPASS GRAFTING (CABG)TIMES TWO (N/A) D/C all chest tubes Start lasix Increase Metoprolol

## 2010-11-23 NOTE — Progress Notes (Signed)
UR Completed.  Jacob Hampton [name] 11/23/2010  

## 2010-11-24 ENCOUNTER — Encounter (HOSPITAL_COMMUNITY): Payer: Self-pay | Admitting: General Practice

## 2010-11-24 ENCOUNTER — Inpatient Hospital Stay (HOSPITAL_COMMUNITY): Payer: Medicare Other

## 2010-11-24 LAB — GLUCOSE, CAPILLARY
Glucose-Capillary: 141 mg/dL — ABNORMAL HIGH (ref 70–99)
Glucose-Capillary: 138 mg/dL — ABNORMAL HIGH (ref 70–99)

## 2010-11-24 LAB — CBC
HCT: 30.9 % — ABNORMAL LOW (ref 39.0–52.0)
Hemoglobin: 10.7 g/dL — ABNORMAL LOW (ref 13.0–17.0)
MCH: 31 pg (ref 26.0–34.0)
MCHC: 34.6 g/dL (ref 30.0–36.0)
MCV: 89.6 fL (ref 78.0–100.0)
RBC: 3.45 MIL/uL — ABNORMAL LOW (ref 4.22–5.81)

## 2010-11-24 LAB — BASIC METABOLIC PANEL
BUN: 22 mg/dL (ref 6–23)
CO2: 27 mEq/L (ref 19–32)
Chloride: 102 mEq/L (ref 96–112)
GFR calc non Af Amer: 56 mL/min — ABNORMAL LOW (ref >90)
Glucose, Bld: 143 mg/dL — ABNORMAL HIGH (ref 70–99)
Potassium: 4.4 mEq/L (ref 3.5–5.1)

## 2010-11-24 MED ORDER — ACETAMINOPHEN 325 MG PO TABS: 650.0000 mg | ORAL_TABLET | Freq: Four times a day (QID) | ORAL | Status: DC | PRN

## 2010-11-24 MED ORDER — SODIUM CHLORIDE 0.9 % IJ SOLN
3.0000 mL | Freq: Two times a day (BID) | INTRAMUSCULAR | Status: DC
  Administered 2010-11-25 – 2010-11-28 (×7): 3 mL via INTRAVENOUS
  Filled 2010-11-24 (×2): qty 3

## 2010-11-24 MED ORDER — AMIODARONE HCL 200 MG PO TABS
400.0000 mg | ORAL_TABLET | Freq: Two times a day (BID) | ORAL | Status: DC
  Administered 2010-11-25 (×2): 400 mg via ORAL
  Filled 2010-11-24 (×3): qty 2

## 2010-11-24 MED ORDER — SODIUM CHLORIDE 0.9 % IJ SOLN: 3.0000 mL | INTRAMUSCULAR | Status: DC | PRN

## 2010-11-24 MED ORDER — SODIUM CHLORIDE 0.9 % IV SOLN
INTRAVENOUS | Status: DC
  Administered 2010-11-24: 11:00:00 via INTRAVENOUS

## 2010-11-24 MED ORDER — TRAMADOL HCL 50 MG PO TABS: 50.0000 mg | ORAL_TABLET | ORAL | Status: DC | PRN

## 2010-11-24 MED ORDER — MORPHINE SULFATE 4 MG/ML IJ SOLN
INTRAMUSCULAR | Status: AC
  Administered 2010-11-24: 2 mg via INTRAVENOUS
  Filled 2010-11-24: qty 1

## 2010-11-24 MED ORDER — POVIDONE-IODINE 10 % EX SOLN
1.0000 "application " | Freq: Two times a day (BID) | CUTANEOUS | Status: DC
  Administered 2010-11-24 – 2010-11-28 (×9): 1 via TOPICAL
  Filled 2010-11-24: qty 15

## 2010-11-24 NOTE — Progress Notes (Signed)
Dr Cornelius Moras notified of amiodarone drip running, orders to be stopped. Patient did not have po amiodarone ordered. Hr 92 sinus. Orders received.

## 2010-11-24 NOTE — Progress Notes (Signed)
[facility] CARDI[facility] PROGRESS NOTE  2 Days Post-Op Procedure(s) (LRB): RADIAL ARTERY HARVEST (Left) REDO CORONARY ARTERY BYPASS GRAFTING (CABG)TIMES TWO (N/A) Subjective: Jacob Hampton feels well except for mild SOB and soreness with cough, deep breath.  He went into AF last night but converted back to NSR earlier this morning on IV Amiodarone  Objective: Vital signs: Patient Vitals for the past 24 hrs:  BP Temp Temp src Pulse Resp SpO2 Weight  11/24/10 0700 98/61 mmHg - - - 22  94 % -  11/24/10 0600 101/70 mmHg - - 73  19  99 % 79.4 kg (175 lb 0.7 oz)  11/24/10 0500 106/64 mmHg - - 70  22  99 % -  11/24/10 0400 101/65 mmHg 98.9 F (37.2 C) Oral 73  16  98 % -  11/24/10 0300 109/62 mmHg - - 85  17  98 % -  11/24/10 0255 - - - 85  19  98 % -  11/24/10 0245 - - - 110  15  98 % -  11/24/10 0200 103/70 mmHg - - 124  17  99 % -  11/24/10 0100 107/72 mmHg - - 126  12  98 % -  11/24/10 0024 102/80 mmHg - - - - - -  11/24/10 0000 102/80 mmHg 99 F (37.2 C) Oral 141  11  98 % -  11/23/10 2300 96/69 mmHg - - 104  16  98 % -  11/23/10 2200 94/62 mmHg - - 115  11  97 % -  11/23/10 2100 100/67 mmHg - - 79  18  98 % -  11/23/10 2000 98/65 mmHg 99.3 F (37.4 C) Oral 102  12  98 % -  11/23/10 1900 102/68 mmHg - - 106  18  92 % -  11/23/10 1800 - - - 98  10  - -  11/23/10 1700 - - - 95  18  - -  11/23/10 1600 - - - 98  10  - -  11/23/10 1500 - - - 95  13  - -  11/23/10 1400 - - - 94  12  - -  11/23/10 1300 - 99.1 F (37.3 C) - 96  18  98 % -  11/23/10 1200 - 99 F (37.2 C) - 95  7  97 % -  11/23/10 1100 - 99 F (37.2 C) - 88  5  99 % -  11/23/10 1000 - 98.6 F (37 C) - 85  14  100 % -  11/23/10 0900 - 98.6 F (37 C) - 84  11  97 % -  11/23/10 0800 - 98.8 F (37.1 C) - 82  9  98 % -  [2  Hemodynamic parameters for last 24 hours: PAP: (32-55)/(15-36) 34/18 mmHg  Physical Exam:  Rhythm:     Breath sounds: Fairly clear  Heart sounds:  RRR  Incisions:  Dressings  dry  Abdomen:  soft  Extremities:  warm  Other:     Intake/Output from previous day: 11/07 0701 - 11/08 0700 In: 1843.5 [P.O.:700; I.V.:941.5; IV Piggyback:202] Out: 2020 [Urine:1550; Drains:470] Intake/Output this shift:    Lab Results:  Basename 11/24/10 0430 11/23/10 1718 11/23/10 1700  WBC 9.8 -- 9.5  HGB 10.7* 9.9* --  HCT 30.9* 29.0* --  PLT 130* -- 136*   BMET:  Basename 11/24/10 0430 11/23/10 1718 11/23/10 0400  NA 134* 139 --  K 4.4 4.1 --  CL 102 108 --  CO2 27 --  24  GLUCOSE 143* 128* --  BUN 22 19 --  CREATININE 1.16 1.00 --  CALCIUM 7.9* -- 7.4*    CBG (last 3)   Basename 11/23/10 1923 11/23/10 1553 11/23/10 1142  GLUCAP 141* 123* 112*   ABG    Component Value Date/Time   PHART 7.404 11/22/2010 2246   HCO3 24.0 11/22/2010 2246   TCO2 21 11/23/2010 1718   ACIDBASEDEF 1.0 11/22/2010 2246   O2SAT 97.0 11/22/2010 2246     Assessment/Plan: S/P Procedure(s) (LRB): RADIAL ARTERY HARVEST (Left) REDO CORONARY ARTERY BYPASS GRAFTING (CABG)TIMES TWO (N/A)  Remove chest tubes as previously ordered.  Transfer step down.  Mobilize.  Continue Amiodarone for now.

## 2010-11-24 NOTE — Consult Note (Signed)
Tobacco cessation- Consult received. Pt quit in 1996.

## 2010-11-24 NOTE — Plan of Care (Signed)
Problem: Phase I Progression Outcomes Goal: Hemodynamically stable Outcome: Not Progressing Patient's heart rhythm in rapid atrial fibrillation - on amiodarone gtt

## 2010-11-24 NOTE — Progress Notes (Signed)
CRITICAL VALUE ALERT  Critical value received:  CBG=226 Date of notification:  Nov 8/12  Time of notification:  1230  Critical value read back:yes Nurse who received alert: Mosie Epstein  MD notified (1st page): Dr Cornelius Moras  Time of first page:  1230 MD notified (2nd page):  Time of second page:  Responding MD Dr. Cornelius Moras. No orders for insulin  as per dr. Cornelius Moras. Time MD responded 1230

## 2010-11-25 ENCOUNTER — Encounter: Payer: Self-pay | Admitting: *Deleted

## 2010-11-25 LAB — TYPE AND SCREEN
Antibody Screen: POSITIVE
Donor AG Type: NEGATIVE
Unit division: 0
Unit division: 0

## 2010-11-25 LAB — POCT I-STAT 4, (NA,K, GLUC, HGB,HCT)
Glucose, Bld: 161 mg/dL — ABNORMAL HIGH (ref 70–99)
HCT: 19 % — ABNORMAL LOW (ref 39.0–52.0)
Potassium: 3.8 mEq/L (ref 3.5–5.1)

## 2010-11-25 LAB — BASIC METABOLIC PANEL
BUN: 21 mg/dL (ref 6–23)
Chloride: 103 mEq/L (ref 96–112)
GFR calc Af Amer: 85 mL/min — ABNORMAL LOW (ref >90)
GFR calc non Af Amer: 73 mL/min — ABNORMAL LOW (ref >90)
Potassium: 4.4 mEq/L (ref 3.5–5.1)
Sodium: 138 mEq/L (ref 135–145)

## 2010-11-25 LAB — CBC
HCT: 29.6 % — ABNORMAL LOW (ref 39.0–52.0)
Hemoglobin: 9.9 g/dL — ABNORMAL LOW (ref 13.0–17.0)
MCHC: 33.4 g/dL (ref 30.0–36.0)
RDW: 16.2 % — ABNORMAL HIGH (ref 11.5–15.5)
WBC: 9.7 10*3/uL (ref 4.0–10.5)

## 2010-11-25 MED ORDER — POTASSIUM CHLORIDE CRYS ER 20 MEQ PO TBCR
20.0000 meq | EXTENDED_RELEASE_TABLET | Freq: Once | ORAL | Status: AC
  Administered 2010-11-25: 20 meq via ORAL

## 2010-11-25 MED ORDER — POTASSIUM CHLORIDE CRYS ER 10 MEQ PO TBCR
20.0000 meq | EXTENDED_RELEASE_TABLET | Freq: Every day | ORAL | Status: DC
  Filled 2010-11-25: qty 2

## 2010-11-25 MED ORDER — FUROSEMIDE 40 MG PO TABS
40.0000 mg | ORAL_TABLET | Freq: Once | ORAL | Status: AC
  Administered 2010-11-25: 40 mg via ORAL
  Filled 2010-11-25: qty 1

## 2010-11-25 MED ORDER — MOVING RIGHT ALONG BOOK
Freq: Once | Status: AC
  Administered 2010-11-25: 18:00:00
  Filled 2010-11-25: qty 1

## 2010-11-25 MED ORDER — FUROSEMIDE 40 MG PO TABS
40.0000 mg | ORAL_TABLET | Freq: Every day | ORAL | Status: DC
  Administered 2010-11-26 – 2010-11-28 (×3): 40 mg via ORAL
  Filled 2010-11-25 (×3): qty 1

## 2010-11-25 MED ORDER — POTASSIUM CHLORIDE 10 MEQ PO TBCR
20.0000 meq | EXTENDED_RELEASE_TABLET | Freq: Every day | ORAL | Status: DC
  Filled 2010-11-25: qty 2

## 2010-11-25 MED ORDER — AMIODARONE HCL 200 MG PO TABS
200.0000 mg | ORAL_TABLET | Freq: Two times a day (BID) | ORAL | Status: DC
  Administered 2010-11-25 – 2010-11-28 (×6): 200 mg via ORAL
  Filled 2010-11-25 (×8): qty 1

## 2010-11-25 MED ORDER — ONDANSETRON HCL 4 MG/2ML IJ SOLN: 4.0000 mg | Freq: Four times a day (QID) | INTRAMUSCULAR | Status: DC | PRN

## 2010-11-25 MED ORDER — FUROSEMIDE 8 MG/ML PO SOLN: 40.0000 mg | Freq: Every day | ORAL | Status: DC

## 2010-11-25 NOTE — Progress Notes (Addendum)
3 Days Post-Op Procedure(s) (LRB): RADIAL ARTERY HARVEST (Left) REDO CORONARY ARTERY BYPASS GRAFTING (CABG)TIMES TWO (N/A)  Subjective: Patient's only complaint is some incisional pain.  Passing flatus but no bowel movement yet.  Objective: Vital signs in last 24 hours: Patient Vitals for the past 24 hrs:  BP Temp Temp src Pulse Resp SpO2 Weight  11/25/10 0618 - - - - - - 172 lb 9.9 oz (78.3 kg)  11/25/10 0556 114/65 mmHg 99.5 F (37.5 C) Oral 91  19  98 % -  11/24/10 2252 103/62 mmHg - - 96  - - -  11/24/10 2106 99/62 mmHg 100 F (37.8 C) Oral 96  14  97 % -  11/24/10 1605 99/59 mmHg - - 102  - 98 % -  11/24/10 1500 - - - - 21  99 % -  11/24/10 1400 103/59 mmHg - - 85  13  99 % -  11/24/10 1300 102/60 mmHg - - 82  18  99 % -  11/24/10 1200 95/54 mmHg 97.6 F (36.4 C) Oral 86  12  98 % -  11/24/10 1100 101/57 mmHg - - 96  21  93 % -  11/24/10 1030 112/63 mmHg - - 101  19  92 % -  11/24/10 1000 93/54 mmHg - - 88  33  92 % -  11/24/10 0900 114/71 mmHg - - 86  31  91 % -  11/24/10 0800 109/65 mmHg 98.7 F (37.1 C) Oral 85  26  95 % -   Pre op weigh:t  75 kg   Hemodynamic parameters for last 24 hours:    Intake/Output from previous day: 11/08 0701 - 11/09 0700 In: 714.4 [P.O.:490; I.V.:224.4] Out: 1637.5 [Urine:1475; Drains:162.5]   Physical Exam:  Cardiovascular: RRR, no murmurs, gallops, or rubs. Pulmonary: Mostly clear to auscultation bilaterally. no rales, wheezes, or rhonchi. Abdomen: Soft, non tender, bowel sounds present. Extremities: Mild bilateral lower extremity edema R>L Wounds: Clean and dry.  No erythema or signs of infection.  Lab Results: CBC: Basename 11/25/10 0525 11/24/10 0430  WBC 9.7 9.8  HGB 9.9* 10.7*  HCT 29.6* 30.9*  PLT 113* 130*   BMET:  Basename 11/25/10 0525 11/24/10 0430  NA 138 134*  K 4.4 4.4  CL 103 102  CO2 30 27  GLUCOSE 113* 143*  BUN 21 22  CREATININE 0.98 1.16  CALCIUM 8.2* 7.9*    PT/INR:  Basename 11/22/10 1627    LABPROT 11.9  INR 0.86   ABG:  INR: Will add last result for INR, ABG once components are confirmed Will add last 4 CBG results once components are confirmed  Assessment/Plan:  1. CV - Previous afib but SR this am.  Continue with Amiodarone 400 po bid,Lopressor 25 mg po bid, and Imdur 30 mg po daily. Continue to monitor blood pressure as may need to adjust medications. 2.  Pulmonary - Encourage incentive spirometer.  Will also provide a flutter valve.  Wean O2 (on 2L via [place]) as tolerates. 3. Volume Overload - Mild volume overload. Lasix 40 mg po today with KCL supplement. 4.  Acute blood loss anemia - H/H slightly decreased from 10.7/30.9 to 9.9/29.6. 5. Thrombocytopenia-Platelets slightly decreased from 130 to 113,000. 6.  JP had approx 70 cc 11/8 and about 12.5 for last 12 hours.  JP drain to remain until instructed otherwise. 7. Continue with CRPI.  Jacob Balls, PA 11/25/2010   I have seen and examined the patient and agree with the assessment  and plan as outlined. Jacob Hampton had some nausea.  Will cut oral Amiodarone dose to 200 bid.  Will d/c Amiodarone at time of d/c from hospital if he doesn't have any further AF.  Overall he looks remarkably good.  Will keep JP drain until there is absolutely no output at all.  Will keep skin sutures at least 2 weeks.  Anticipate possible d/c to SNF by Monday.

## 2010-11-25 NOTE — Progress Notes (Signed)
CSW received referral for SNF placement. Assessment completed and placed in shadow chart. CSW has faxed pt out to skilled facilities, per pt request. CSW will follow for d/c planning.  Baxter Flattery, MSW, [employer] [phone]

## 2010-11-25 NOTE — Progress Notes (Signed)
Physical Therapy Evaluation Patient Details Name: Jacob Hampton MRN: [id number] DOB: 09-21-1924 Today's Date: 11/25/2010  Problem List:  Patient Active Problem List  Diagnoses  . CAD (coronary artery disease)  . Hyperlipidemia  . History of upper GI bleeding  . History of CABG  . History of tuberculosis    Past Medical History:  Past Medical History  Diagnosis Date  . Hyperlipidemia   . History of upper GI bleeding   . History of primary tuberculosis   . CAD (coronary artery disease)     Dr. Donnie Aho is his Cardiologist  . Angina   . Blood transfusion     Had 8 units for GI bleed  . Pneumonia     h/o waling pneumonia at least 20 years ago  . GERD (gastroesophageal reflux disease)   . Hypertension   . Tuberculosis 1969    Was in [facility] for 1 year  . Sinus drainage     11/24/10 "every morning when I get up I have to clear it out"  . Sleep disorder     "trouble staying asleep"  . Dizzy     "when I take my blood pressure medicine sometimes it makes me a little dizzy"  . Anemia   . Arthritis    Past Surgical History:  Past Surgical History  Procedure Date  . Partial gastrectomy   . Hernia repair   . Right testicle surgery   . Other surgical history 03/25/1999    reexploration for bleeding s/p CABG - coagulopathy without mechanical source  . Redo cabg 11/22/2010    CABG x2 with left radial to OM and SVG to RCA  . Coronary artery bypass graft 03/25/1999    CABG x5 using LIMA to LAD, SVG to Diag, SVG to OM, sequential SVG to PD and RPL, open SV harvest from left thigh and leg  . Cholecystectomy     "w/bleeding ulcer OR"  . Bleeding ulcer     w/gallbladder  . Appendectomy   . Cataract extraction w/ intraocular lens implant     bilaterally  . Eye surgery     PT Assessment/Plan/Recommendation PT Assessment Clinical Impression Statement: Pt with redo CABG and will follow along with cardiac rehab to promote independence and decrease burden of care PT  Recommendation/Assessment: Patient will need skilled PT in the acute care venue PT Problem List: Decreased activity tolerance;Decreased knowledge of use of DME;Decreased knowledge of precautions Barriers to Discharge: Decreased caregiver support Barriers to Discharge Comments: son works 8am-5p and not available for pt supervision or assist PT Therapy Diagnosis : Abnormality of gait PT Plan PT Frequency: Min 3X/week PT Treatment/Interventions: Gait training;DME instruction;Functional mobility training;Stair training;Patient/family education PT Recommendation Follow Up Recommendations: Skilled nursing facility;24 hour supervision/assistance Equipment Recommended: Defer to next venue PT Goals  Acute Rehab PT Goals PT Goal Formulation: With patient Time For Goal Achievement: 2 weeks Pt will Roll Supine to Right Side: with modified independence PT Goal: Rolling Supine to Right Side - Progress: Other (comment) Pt will go Supine/Side to Sit: with modified independence PT Goal: Supine/Side to Sit - Progress: Other (comment) Pt will go Sit to Supine/Side: with modified independence PT Goal: Sit to Supine/Side - Progress: Other (comment) Pt will Transfer Sit to Stand/Stand to Sit: with modified independence PT Transfer Goal: Sit to Stand/Stand to Sit - Progress: Other (comment) Pt will Ambulate: >150 feet;with modified independence;with least restrictive assistive device PT Goal: Ambulate - Progress: Other (comment) Pt will Go Up / Down Stairs: 1-2 stairs;with modified  independence;with least restrictive assistive device PT Goal: Up/Down Stairs - Progress: Other (comment) Additional Goals Additional Goal #1: Pt will independently state and demonstrate sternal precautions PT Goal: Additional Goal #1 - Progress: Other (comment)  PT Evaluation Precautions/Restrictions  Precautions Precautions: Sternal Prior Functioning  Home Living Lives With: Son Type of Home: House Home Layout: One  level Home Access: Stairs to enter Entrance Stairs-Rails: None Entrance Stairs-Number of Steps: 2 Bathroom Shower/Tub: Forensic scientist: Standard Home Adaptive Equipment: Walker - rolling;Bedside commode/3-in-1 Prior Function Level of Independence: Independent with basic ADLs;Independent with transfers;Independent with homemaking with ambulation;Independent with gait Cognition Cognition Arousal/Alertness: Awake/alert Overall Cognitive Status: Appears within functional limits for tasks assessed Orientation Level: Oriented X4 Sensation/Coordination   Extremity Assessment RLE Assessment RLE Assessment: Within Functional Limits LLE Assessment LLE Assessment: Within Functional Limits Mobility (including Balance) Bed Mobility Bed Mobility: Yes Rolling Right: 4: Min assist Rolling Right Details (indicate cue type and reason): cueing for precautions Right Sidelying to Sit: 4: Min assist;HOB flat Right Sidelying to Sit Details (indicate cue type and reason): cueing for sequence and assist to maintain precautions Sit to Supine - Right: 5: Supervision Sit to Supine - Right Details (indicate cue type and reason): cueing for sequence Transfers Transfers: Yes Sit to Stand: 5: Supervision Sit to Stand Details (indicate cue type and reason): cueing for hand placement Stand to Sit: 5: Supervision Stand to Sit Details: cueing for hand placement Ambulation/Gait Ambulation/Gait: Yes Ambulation/Gait Assistance: 5: Supervision Ambulation Distance (Feet): 330 Feet Assistive device: Rolling walker Gait Pattern: Decreased stride length Gait velocity: 30 feet in 32 sec = .938 which places pt at a significantly increased fall risk Stairs: No  Posture/Postural Control Posture/Postural Control: No significant limitations Exercise    End of Session PT - End of Session Equipment Utilized During Treatment: Gait belt Activity Tolerance: Patient tolerated treatment  well Patient left: in chair;with call bell in reach;with family/visitor present Nurse Communication: Mobility status for ambulation General Behavior During Session: [facility] for tasks performed Cognition: [facility] for tasks performed  Delorse Lek 11/25/2010, 2:33 PM [phone] Will follow with cardiac rehab MWF pm

## 2010-11-25 NOTE — Progress Notes (Addendum)
CARDIAC REHAB PHASE I   PRE:  Rate/Rhythm: 107 ST  BP:  Supine:   Sitting: 122/64  Standing:    SaO2: 98 2L 95 RA  MODE:  Ambulation: 350 ft   POST:  Rate/Rhythem: 109 ST  BP:  Supine:   Sitting: 122/62  Standing:    SaO2: 94 RA PT AMBULATED HALL ON RA WITH WALKER WITH 1 ASSIST TOLERATED WELL. ABLE TO D/C 02 AFTER WALK.  RETURN TO CHAIR WITH FEET UP WITH CALL LIGHT IN REACH.  [phone]   Rosalie Doctor

## 2010-11-25 NOTE — Progress Notes (Signed)
MET WITH PT AND FAMILY TO DISCUSS DC PLANS.  PTA, PT LIVES WITH SON AND HIS FAMILY, WHO WORKS.  PT WILL NOT HAVE 24HR SUPERVISION AT DISCHARGE, AND IS REQUESTING SHORT TERM SNF AT DISCHARGE FOR REHAB.  WILL CONSULT CSW TO FACILTITATE DC TO SNF WHEN MEDICALLY STABLE.  WILL CONSULT PT AND OT TO EVALUATE FOR SNF PLACEMENT.

## 2010-11-26 MED ORDER — SODIUM CHLORIDE 0.9 % IV SOLN: 250.0000 mL | INTRAVENOUS | Status: DC

## 2010-11-26 MED ORDER — POTASSIUM CHLORIDE CRYS ER 20 MEQ PO TBCR
20.0000 meq | EXTENDED_RELEASE_TABLET | Freq: Every day | ORAL | Status: DC
  Administered 2010-11-27 – 2010-11-28 (×2): 20 meq via ORAL
  Filled 2010-11-26 (×3): qty 1

## 2010-11-26 MED ORDER — GUAIFENESIN ER 600 MG PO TB12
600.0000 mg | ORAL_TABLET | Freq: Two times a day (BID) | ORAL | Status: DC
  Administered 2010-11-26 – 2010-11-28 (×5): 600 mg via ORAL
  Filled 2010-11-26 (×6): qty 1

## 2010-11-26 NOTE — Progress Notes (Signed)
CARDIAC REHAB PHASE I   PRE:  Rate/Rhythm: 92NSR  BP:  Supine:   Sitting: 109/60  Standing:    SaO2: 93% RA  MODE:  Ambulation: 400 ft   POST:  Rate/Rhythem: 90 NSR  BP:  Supine:   Sitting: 112/64  Standing:    SaO2: 90%RA Patient ambulated 400Ft with RW x1 assist. Gait much better. Remembered to look forward when walking. VSS. Tolerated well. Completed discharge education with patient and patients son. Agreed to outpatient cardiac rehab. Will send referral to [facility].  Patient voices understanding.  BROWN, Burak Zerbe L

## 2010-11-26 NOTE — Progress Notes (Signed)
4 Days Post-Op  Procedure(s) (LRB): RADIAL ARTERY HARVEST (Left) REDO CORONARY ARTERY BYPASS GRAFTING (CABG)TIMES TWO (N/A) Subjective: Patient continues to do well. He does have some sinus congestion and productive cough. His stools are somewhat loose and he relates that to stool softeners and laxatives. Ambulation continues to improve. Tentatively he does plan to do further rehabilitation in a skilled nursing facility setting for at least a couple weeks.  Objective  Telemetry normal sinus rhythm, sinus tachycardia, occasional PVCs area and  Temp:  [97.2 F (36.2 C)-98.7 F (37.1 C)] 98.7 F (37.1 C) (11/10 0524) Pulse Rate:  [80-89] 86  (11/10 0524) Resp:  [18] 18  (11/10 0524) BP: (91-103)/(56-62) 103/62 mmHg (11/10 0524) SpO2:  [91 %-95 %] 94 % (11/10 0524) Weight:  [164 lb 0.4 oz (74.4 kg)-167 lb 14.4 oz (76.159 kg)] 167 lb 14.4 oz (76.159 kg) (11/10 0524)   Intake/Output Summary (Last 24 hours) at 11/26/10 0816 Last data filed at 11/26/10 0656  Gross per 24 hour  Intake    480 ml  Output    880 ml  Net   -400 ml       Physical exam: Gen.: Alert in no acute distress. Pulmonary: Slightly diminished air movement in the bases, otherwise clear. Cardiac: Regular rate and rhythm, no gallops or rubs  Abdomen: Soft, nontender, normal active bowel sounds. Extremities: Mild edema bilateral lower extremity. Incisions: Healing well without evidence of infection. Left arm: Neurovascular intact.   Lab Results:  Basename 11/25/10 0525 11/24/10 0430 11/23/10 1700  NA 138 134* --  K 4.4 4.4 --  CL 103 102 --  CO2 30 27 --  GLUCOSE 113* 143* --  BUN 21 22 --  CREATININE 0.98 1.16 --  CALCIUM 8.2* 7.9* --  MG -- -- 2.4  PHOS -- -- --   No results found for this basename: AST:2,ALT:2,ALKPHOS:2,BILITOT:2,PROT:2,ALBUMIN:2 in the last 72 hours No results found for this basename: LIPASE:2,AMYLASE:2 in the last 72 hours  Basename 11/25/10 0525 11/24/10 0430  WBC 9.7 9.8    NEUTROABS -- --  HGB 9.9* 10.7*  HCT 29.6* 30.9*  MCV 91.1 89.6  PLT 113* 130*   No results found for this basename: CKTOTAL:4,CKMB:4,TROPONINI:4 in the last 72 hours No results found for this basename: POCBNP:3 in the last 72 hours No results found for this basename: DDIMER in the last 72 hours No results found for this basename: HGBA1C in the last 72 hours No results found for this basename: CHOL,HDL,LDLCALC,TRIG,CHOLHDL in the last 72 hours No results found for this basename: TSH,T4TOTAL,FREET3,T3FREE,THYROIDAB in the last 72 hours No results found for this basename: VITAMINB12,FOLATE,FERRITIN,TIBC,IRON,RETICCTPCT in the last 72 hours  Medications: Scheduled    . acetaminophen  1,000 mg Oral Q6H   Or  . acetaminophen (TYLENOL) oral liquid 160 mg/5 mL  975 mg Per Tube Q6H   Or  . acetaminophen  975 mg Rectal Q6H  . amiodarone  200 mg Oral BID  . aspirin EC  325 mg Oral Daily  . bisacodyl  10 mg Oral Daily   Or  . bisacodyl  10 mg Rectal Daily  . docusate sodium  200 mg Oral Daily  . feeding supplement  1 Container Oral TID WC  . folic acid  1 mg Oral Daily  . furosemide  40 mg Oral Once  . furosemide  40 mg Oral Daily  . isosorbide mononitrate  30 mg Oral Daily  . metoprolol tartrate  25 mg Oral BID  . moving right  along book   Does not apply Once  . pantoprazole  40 mg Oral Q1200  . potassium chloride  20 mEq Oral Daily  . potassium chloride  20 mEq Oral Once  . povidone-iodine  1 application Topical BID  . rosuvastatin  20 mg Oral q1800  . sodium chloride  3 mL Intravenous Q12H  . DISCONTD: amiodarone  400 mg Oral BID  . DISCONTD: furosemide  40 mg Oral Daily  . DISCONTD: potassium chloride  20 mEq Oral Daily     Radiology/Studies:  No results found.  INR: Will add last result for INR, ABG once components are confirmed Will add last 4 CBG results once components are confirmed  Assessment/Plan: S/P Procedure(s) (LRB): RADIAL ARTERY HARVEST (Left) REDO  CORONARY ARTERY BYPASS GRAFTING (CABG)TIMES TWO (N/A)  The patient continues to progress nicely. We'll continue gentle diuresis. Continue to increase activities as tolerated. J-P output is currently minimal at only 30 cc yesterday. And possibly DC in a.m. Should be stable for discharge early week to skilled nursing facility.    LOS: 4 days    GOLD,WAYNE E 11/10/20128:16 AM

## 2010-11-27 ENCOUNTER — Other Ambulatory Visit: Payer: Self-pay

## 2010-11-27 LAB — BASIC METABOLIC PANEL
BUN: 18 mg/dL (ref 6–23)
CO2: 28 mEq/L (ref 19–32)
Calcium: 8.3 mg/dL — ABNORMAL LOW (ref 8.4–10.5)
Chloride: 103 mEq/L (ref 96–112)
Creatinine, Ser: 0.91 mg/dL (ref 0.50–1.35)
GFR calc Af Amer: 88 mL/min — ABNORMAL LOW (ref >90)
GFR calc non Af Amer: 76 mL/min — ABNORMAL LOW (ref >90)
Glucose, Bld: 103 mg/dL — ABNORMAL HIGH (ref 70–99)
Potassium: 3.7 mEq/L (ref 3.5–5.1)
Sodium: 137 mEq/L (ref 135–145)

## 2010-11-27 MED ORDER — METOPROLOL TARTRATE 25 MG PO TABS: 25.0000 mg | ORAL_TABLET | Freq: Two times a day (BID) | ORAL | Status: DC

## 2010-11-27 MED ORDER — FUROSEMIDE 40 MG PO TABS: 40.0000 mg | ORAL_TABLET | Freq: Every day | ORAL | Status: AC

## 2010-11-27 MED ORDER — AMIODARONE HCL 200 MG PO TABS: 200.0000 mg | ORAL_TABLET | Freq: Two times a day (BID) | ORAL | Status: DC

## 2010-11-27 MED ORDER — ASPIRIN 325 MG PO TBEC: 325.0000 mg | DELAYED_RELEASE_TABLET | Freq: Every day | ORAL | Status: AC

## 2010-11-27 MED ORDER — POTASSIUM CHLORIDE CRYS ER 20 MEQ PO TBCR: 20.0000 meq | EXTENDED_RELEASE_TABLET | Freq: Every day | ORAL | Status: AC

## 2010-11-27 MED ORDER — TRAMADOL HCL 50 MG PO TABS: 50.0000 mg | ORAL_TABLET | Freq: Four times a day (QID) | ORAL | Status: AC | PRN

## 2010-11-27 MED ORDER — ROSUVASTATIN CALCIUM 20 MG PO TABS: 20.0000 mg | ORAL_TABLET | Freq: Every day | ORAL | Status: DC

## 2010-11-27 NOTE — Discharge Summary (Signed)
Jacob Hampton 03-01-1924 77 y.o. [id number]  11/22/2010   Purcell Nails, MD  cornary artery disease   HPI:  This is a 77 y.o. male with known history of coronary artery disease, hyperlipidemia, and peptic ulcer disease. The patient originally underwent coronary artery bypass grafting x5 in 2001 for left main disease with three-vessel coronary artery disease and normal left ventricular function. Graft was placed at that time of surgery included left internal mammary artery to the distal left anterior descending coronary artery, saphenous vein graft to the diagonal branch, saphenous vein graft to the obtuse marginal branch of the left circumflex coronary artery, and sequential saphenous vein graft to the posterior descending and posterior lateral branches the right coronary artery. The patient did have a severe coagulopathy and bleeding early after surgery requiring reexploration. He recovered uneventfully from the surgery and has done remarkably well until recently. Several weeks ago he began to develop typical symptoms of angina pectoris. Initially the symptoms occurred when he was walking up a hill to go to a football game. Since that time, he has had 4 or 5 episodes of chest discomfort usually with physical exertion. Symptoms are classic in nature and usually relieved promptly by rest. He has had one or 2 episodes that have occurred after he has eaten . Symptoms are probably relieved with nitroglycerin. He was seen by Dr. Arlyn Leak and scheduled for elective cardiac catheterization. Catheterization was performed on 11/16/2010 and findings were notable for severe left main disease with 95-99% distal stenosis of the left main coronary artery. There is 100% proximal occlusion of the left anterior descending coronary artery. There is severe 95-99% proximal stenosis of the right coronary artery with diffuse disease in the midportion and distal portion of vessel. The left internal mammary artery is widely  patent to the left anterior descending coronary artery. All of the previous vein grafts are occluded. Left ventricular function is normal with no significant wall motion abnormalities. He was referred to Dr. Cornelius Moras for consideration of redo coronary artery bypass grafting. Dr. Cornelius Moras in and evaluated the patient and his his studies and agree with recommendations to proceed with a redo surgical revascularization . He was admitted this hospitalization for the procedure.   Review of symptoms: Please see the dictated history and physical.  Physical exam: Please see the dictated history and physical.  Family history: Noncontributory  Social history: The patient is a widower who lives with one of his sons locally. He remains remarkably active physically and enjoys bowling and walking on the treadmill. He still drives and all of BUN remains completely functionally independent. He is retired, having previously worked  [employer]. He has a remote history of tobacco use but quit in 1996. Does not use alcohol.  Medications prior to admission: 1 Toprol XL 50 mg by mouth daily 2. Lipitor 80 mg by mouth daily 3. Folic acid 1 mg 4. Nitroglycerin sublingual 0.4 mg when necessary   Allergies: No known, the patient does have a history of a bleeding ulcer with aspirin therapy.   Hospital Course:  The patient was admitted to the hospital and taken to the operating room on 11/22/2010 and underwent Procedure(s): RADIAL ARTERY HARVEST REDO CORONARY ARTERY BYPASS GRAFTING (CABG)TIMES TWO.  The pt tolerated the procedure well and was transported to the SICU in stable condition.  Post Operative hospital course:  Overall the patient has done quite well. He was weaned from the ventilator without difficulty. He had remained hemodynamic  stable the lead has  had atrial fibrillation. He has subsequently been chemically cardioverted to normal sinus rhythm with amiodarone. All routine lines, monitors, and tubes have  been discontinued in the standard fashion. His left arm has remained neurovascularly intact. He is tolerating routine advancement in activities using standard protocols. His incisions are healing well without evidence of infection. He is increasing his activities without difficulty using standard protocols. He has been weaned from oxygen and maintained good saturations on room air. He does have an acute blood loss anemia. Most recent hemoglobin dated 11/25/2010 is 9.9. Also has a postoperative thrombocytopenia. Platelet count is 113,000 dated 11/25/2010 Tentatively he is scheduled for discharge in the morning of 11/28/2010 to a skilled nursing facility pending morning round reevaluation.    Basename 11/27/10 0530 11/25/10 0525  NA 137 138  K 3.7 4.4  CL 103 103  CO2 28 30  GLUCOSE 103* 113*  BUN 18 21  CALCIUM 8.3* 8.2*    Basename 11/25/10 0525  WBC 9.7  HGB 9.9*  HCT 29.6*  PLT 113*   No results found for this basename: INR:2 in the last 72 hours   Discharge Instructions:  The patient is discharged to home with extensive instructions on wound care and progressive ambulation.  They are instructed not to drive or perform any heavy lifting until returning to see the physician in his office.  Discharge Diagnosis: Recurrent cornary artery disease  Secondary Diagnosis: Patient Active Problem List  Diagnoses  . CAD (coronary artery disease)  . Hyperlipidemia  . History of upper GI bleeding  . History of CABG  . History of tuberculosis    post operative atrial fibrillation. Post operative acute blood loss anemia   Past Medical History  Diagnosis Date  . Hyperlipidemia   . History of upper GI bleeding   . History of primary tuberculosis   . CAD (coronary artery disease)     Dr. Donnie Aho is his Cardiologist  . Angina   . Blood transfusion     Had 8 units for GI bleed  . Pneumonia     h/o waling pneumonia at least 20 years ago  . GERD (gastroesophageal reflux disease)   .  Hypertension   . Tuberculosis 1969    Was in sanitorium for 1 year  . Sinus drainage     11/24/10 "every morning when I get up I have to clear it out"  . Sleep disorder     "trouble staying asleep"  . Dizzy     "when I take my blood pressure medicine sometimes it makes me a little dizzy"  . Anemia   . Arthritis        Tavares, Levinson  Home Medication Instructions ZOX:[id number]   Printed on:11/27/10 1318  Medication Information                    folic acid (FOLVITE) 1 MG tablet Take 1 mg by mouth daily.             atorvastatin (LIPITOR) 80 MG tablet Take 80 mg by mouth at bedtime.            metoprolol (TOPROL-XL) 100 MG 24 hr tablet Take 50 mg by mouth daily.             amiodarone (PACERONE) 200 MG tablet Take 1 tablet (200 mg total) by mouth 2 (two) times daily.           aspirin EC 325 MG EC tablet Take 1 tablet (325 mg total)  by mouth daily.           furosemide (LASIX) 40 MG tablet Take 1 tablet (40 mg total) by mouth daily.           metoprolol tartrate (LOPRESSOR) 25 MG tablet Take 1 tablet (25 mg total) by mouth 2 (two) times daily.           potassium chloride SA (K-DUR,KLOR-CON) 20 MEQ tablet Take 1 tablet (20 mEq total) by mouth daily.           rosuvastatin (CRESTOR) 20 MG tablet Take 1 tablet (20 mg total) by mouth daily at 6 PM.           traMADol (ULTRAM) 50 MG tablet Take 1-2 tablets (50-100 mg total) by mouth every 6 (six) hours as needed for pain. Maximum dose= 8 tablets per day             Disposition: To be discharged to skilled nursing facility  Patient's condition is Good  Gershon Crane PA-C   I agree with the above discharge summary and plan for follow-up.  He needs an appointment to see me in 2 weeks in the office.  Hutson Luft H

## 2010-11-27 NOTE — Progress Notes (Addendum)
5 Days Post-Op  Procedure(s) (LRB): RADIAL ARTERY HARVEST (Left) REDO CORONARY ARTERY BYPASS GRAFTING (CABG)TIMES TWO (N/A) Subjective: He continues to feel well. He denies shortness of breath. Pain is well-controlled. Oxygen has been weaned and saturations are 94% to 96% on room air.  Objective  Telemetry normal sinus rhythm. JP 15 cc yesterday Temp:  [97.8 F (36.6 C)-98.1 F (36.7 C)] 98.1 F (36.7 C) (11/11 0454) Pulse Rate:  [79-94] 83  (11/11 0454) Resp:  [18] 18  (11/11 0454) BP: (92-107)/(53-64) 107/64 mmHg (11/11 0454) SpO2:  [94 %-96 %] 96 % (11/11 0454) Weight:  [166 lb 6.4 oz (75.479 kg)] 166 lb 6.4 oz (75.479 kg) (11/11 0454)   Intake/Output Summary (Last 24 hours) at 11/27/10 1012 Last data filed at 11/27/10 0000  Gross per 24 hour  Intake    240 ml  Output    665 ml  Net   -425 ml       General appearance: alert and no distress Heart: regular rate and rhythm, S1, S2 normal, no murmur, click, rub or gallop Lungs: clear to auscultation bilaterally Abdomen: soft, non-tender; bowel sounds normal; no masses,  no organomegaly Extremities: 1+ edema both lower extremities Wound: Healing well without evidence of infection. The left radial artery harvest site is ecchymotic. There is no evidence of cellulitis.  Lab Results:  [facility] 11/27/10 0530 11/25/10 0525  NA 137 138  K 3.7 4.4  CL 103 103  CO2 28 30  GLUCOSE 103* 113*  BUN 18 21  CREATININE 0.91 0.98  CALCIUM 8.3* 8.2*  MG -- --  PHOS -- --   No results found for this basename: AST:2,ALT:2,ALKPHOS:2,BILITOT:2,PROT:2,ALBUMIN:2 in the last 72 hours No results found for this basename: LIPASE:2,AMYLASE:2 in the last 72 hours  Basename 11/25/10 0525  WBC 9.7  NEUTROABS --  HGB 9.9*  HCT 29.6*  MCV 91.1  PLT 113*   No results found for this basename: CKTOTAL:4,CKMB:4,TROPONINI:4 in the last 72 hours No results found for this basename: POCBNP:3 in the last 72 hours No results found for this  basename: DDIMER in the last 72 hours No results found for this basename: HGBA1C in the last 72 hours No results found for this basename: CHOL,HDL,LDLCALC,TRIG,CHOLHDL in the last 72 hours No results found for this basename: TSH,T4TOTAL,FREET3,T3FREE,THYROIDAB in the last 72 hours No results found for this basename: VITAMINB12,FOLATE,FERRITIN,TIBC,IRON,RETICCTPCT in the last 72 hours  Medications: Scheduled    . acetaminophen  1,000 mg Oral Q6H   Or  . acetaminophen (TYLENOL) oral liquid 160 mg/5 mL  975 mg Per Tube Q6H   Or  . acetaminophen  975 mg Rectal Q6H  . amiodarone  200 mg Oral BID  . aspirin EC  325 mg Oral Daily  . bisacodyl  10 mg Oral Daily   Or  . bisacodyl  10 mg Rectal Daily  . docusate sodium  200 mg Oral Daily  . feeding supplement  1 Container Oral TID WC  . folic acid  1 mg Oral Daily  . furosemide  40 mg Oral Daily  . guaiFENesin  600 mg Oral BID  . isosorbide mononitrate  30 mg Oral Daily  . metoprolol tartrate  25 mg Oral BID  . pantoprazole  40 mg Oral Q1200  . potassium chloride  20 mEq Oral Daily  . povidone-iodine  1 application Topical BID  . rosuvastatin  20 mg Oral q1800  . sodium chloride  3 mL Intravenous Q12H  . DISCONTD: potassium chloride  20  mEq Oral Daily     Radiology/Studies:  No results found.  INR: Will add last result for INR, ABG once components are confirmed Will add last 4 CBG results once components are confirmed  Assessment/Plan: S/P Procedure(s) (LRB): RADIAL ARTERY HARVEST (Left) REDO CORONARY ARTERY BYPASS GRAFTING (CABG)TIMES TWO (N/A) 1. Patient continues to progress nicely. 2. Continue gentle diuresis. 3. DC JP drain today as the drainage is minimal. 4. Plan to discharge early next week to skilled nursing facility.   LOS: 5 days    GOLD,WAYNE E 11/11/201210:12 AM   patient examined and medical record reviewed,agree with above note.Lower sternal incision clean and dry. VAN TRIGT III,Mckinzie Saksa 11/27/2010

## 2010-11-28 ENCOUNTER — Encounter (HOSPITAL_COMMUNITY): Payer: Self-pay | Admitting: Thoracic Surgery (Cardiothoracic Vascular Surgery)

## 2010-11-28 LAB — GLUCOSE, CAPILLARY: Glucose-Capillary: 89 mg/dL (ref 70–99)

## 2010-11-28 LAB — BASIC METABOLIC PANEL
BUN: 16 mg/dL (ref 6–23)
CO2: 27 mEq/L (ref 19–32)
Chloride: 101 mEq/L (ref 96–112)
Glucose, Bld: 99 mg/dL (ref 70–99)
Potassium: 4.3 mEq/L (ref 3.5–5.1)
Sodium: 135 mEq/L (ref 135–145)

## 2010-11-28 MED ORDER — METOPROLOL TARTRATE 1 MG/ML IV SOLN
INTRAVENOUS | Status: AC
  Filled 2010-11-28: qty 5

## 2010-11-28 MED ORDER — METOPROLOL TARTRATE 1 MG/ML IV SOLN
5.0000 mg | Freq: Once | INTRAVENOUS | Status: AC
  Administered 2010-11-28: 5 mg via INTRAVENOUS

## 2010-11-28 NOTE — Progress Notes (Signed)
   CARDIOTHORACIC SURGERY PROGRESS NOTE  6 Days Post-Op  S/P Procedure(s) (LRB): RADIAL ARTERY HARVEST (Left) REDO CORONARY ARTERY BYPASS GRAFTING (CABG)TIMES TWO (N/A)  Subjective: Jacob Hampton feels well.  He's getting around reasonably well.  Appetite okay.  Bowels working.  He did have a brief episode of PAF overnight, but with stable HR.  For the most part he has remained NSR.  Objective: Vital signs in last 24 hours: Temp:  [98.1 F (36.7 C)-99.2 F (37.3 C)] 99.2 F (37.3 C) (11/12 0807) Pulse Rate:  [78-118] 90  (11/12 0807) Cardiac Rhythm:  [-] Heart block (11/11 2000) Resp:  [18] 18  (11/12 0807) BP: (100-132)/(59-77) 123/73 mmHg (11/12 0807) SpO2:  [94 %-96 %] 95 % (11/12 0807) Weight:  [73.982 kg (163 lb 1.6 oz)] 163 lb 1.6 oz (73.982 kg) (11/12 4098)  Physical Exam:  Rhythm:   sinus  Breath sounds: clear  Heart sounds:  RRR  Incisions:  Clean and dry  Abdomen:  soft  Extremities:  Warm, mild edema   Intake/Output from previous day: 11/11 0701 - 11/12 0700 In: 720 [P.O.:720] Out: 1282.5 [Urine:1275; Drains:7.5] Intake/Output this shift:    Lab Results: No results found for this basename: WBC:2,HGB:2,HCT:2,PLT:2 in the last 72 hours BMET:  [facility] 11/27/10 0530  NA 137  K 3.7  CL 103  CO2 28  GLUCOSE 103*  BUN 18  CREATININE 0.91  CALCIUM 8.3*    CBG (last 3)  No results found for this basename: GLUCAP:3 in the last 72 hours  Assessment/Plan: S/P Procedure(s) (LRB): RADIAL ARTERY HARVEST (Left) REDO CORONARY ARTERY BYPASS GRAFTING (CABG)TIMES TWO (N/A)  Doing well overall.  Recurrent PAF that is stable and associated with good rate-control on Amiodarone, remaining NSR for the most part.  I think he's okay for d/c to SNF  Will need follow-up appt 2 weeks to check his sternal incision and possibly remove sutures.

## 2010-11-28 NOTE — Progress Notes (Signed)
CSW has provided d/c summary to [place], pt packet prepared in [place]. RN notified and [facility] called to transport pt at 3. Awaiting MD to sign FL2 and CSW signing off.

## 2010-11-28 NOTE — Progress Notes (Signed)
Occupational Therapy Evaluation Patient Details Name: Jacob Hampton MRN: [id number] DOB: Jun 09, 1924 Today's Date: 11/28/2010  Problem List:  Patient Active Problem List  Diagnoses  . CAD (coronary artery disease)  . Hyperlipidemia  . History of upper GI bleeding  . History of CABG  . History of tuberculosis    Past Medical History:  Past Medical History  Diagnosis Date  . Hyperlipidemia   . History of upper GI bleeding   . History of primary tuberculosis   . CAD (coronary artery disease)     Dr. Donnie Aho is his Cardiologist  . Angina   . Blood transfusion     Had 8 units for GI bleed  . Pneumonia     h/o waling pneumonia at least 20 years ago  . GERD (gastroesophageal reflux disease)   . Hypertension   . Tuberculosis 1969    Was in sanitorium for 1 year  . Sinus drainage     11/24/10 "every morning when I get up I have to clear it out"  . Sleep disorder     "trouble staying asleep"  . Dizzy     "when I take my blood pressure medicine sometimes it makes me a little dizzy"  . Anemia   . Arthritis    Past Surgical History:  Past Surgical History  Procedure Date  . Partial gastrectomy   . Hernia repair   . Right testicle surgery   . Other surgical history 03/25/1999    reexploration for bleeding s/p CABG - coagulopathy without mechanical source  . Redo cabg 11/22/2010    CABG x2 with left radial to OM and SVG to RCA  . Coronary artery bypass graft 03/25/1999    CABG x5 using LIMA to LAD, SVG to Diag, SVG to OM, sequential SVG to PD and RPL, open SV harvest from left thigh and leg  . Cholecystectomy     "w/bleeding ulcer OR"  . Bleeding ulcer     w/gallbladder  . Appendectomy   . Cataract extraction w/ intraocular lens implant     bilaterally  . Eye surgery     OT Assessment/Plan/Recommendation OT Recommendation Follow Up Recommendations: Skilled nursing facility  OT Assessment Clinical Impression Statement: Pt. will benefit from OT in the acute care setting  to increase independence with ADLs and decrease burden of care at next venue of care OT Recommendation/Assessment: Patient will need skilled OT in the acute care venue OT Problem List: Decreased strength;Decreased activity tolerance;Impaired balance (sitting and/or standing);Decreased safety awareness;Decreased knowledge of precautions Barriers to Discharge: Decreased caregiver support OT Therapy Diagnosis : Generalized weakness OT Plan OT Frequency: Min 1X/week OT Treatment/Interventions: Self-care/ADL training;DME and/or AE instruction;Therapeutic activities;Patient/family education;Balance training  Equipment Recommended: Defer to next venue Individuals Consulted Consulted and Agree with Results and Recommendations: Patient OT Goals Acute Rehab OT Goals OT Goal Formulation: With patient Time For Goal Achievement: 2 weeks ADL Goals Pt Will Perform Grooming: with set-up;Standing at sink;Other (comment) (For ~5 mins without a rest break) ADL Goal: Grooming - Progress: Other (comment) Pt Will Perform Lower Body Bathing: with set-up;Sit to stand in shower ADL Goal: Lower Body Bathing - Progress: Other (comment) Pt Will Perform Lower Body Dressing: with set-up;Sit to stand from bed;with adaptive equipment ADL Goal: Lower Body Dressing - Progress: Other (comment) Pt Will Transfer to Toilet: with supervision;3-in-1;Ambulation ADL Goal: Toilet Transfer - Progress: Other (comment)  OT Evaluation Precautions/Restrictions  Precautions Precautions: Sternal Precaution Comments: Pt. educated on ADLs with following sternal precautions Required Braces or Orthoses:  No Restrictions Weight Bearing Restrictions: No Prior Functioning Home Living Lives With: Son Type of Home: House Home Layout: One level Home Access: Stairs to enter Entrance Stairs-Rails: None Entrance Stairs-Number of Steps: 2 Bathroom Shower/Tub: Forensic scientist: Standard Home Adaptive Equipment:  Walker - rolling;Bedside commode/3-in-1 Prior Function Level of Independence: Independent with basic ADLs;Independent with transfers;Independent with homemaking with ambulation;Independent with gait Vocation: Retired ADL ADL Eating/Feeding: Independent Where Assessed - Eating/Feeding: Chair Grooming: Performed;Wash/dry face;Brushing hair;Set up Where Assessed - Grooming: Standing at sink Upper Body Bathing: Simulated;Chest;Right arm;Left arm;Abdomen;Set up Where Assessed - Upper Body Bathing: Sitting, chair Lower Body Bathing: Simulated;Minimal assistance Where Assessed - Lower Body Bathing: Sit to stand from bed Upper Body Dressing: Performed;Set up Upper Body Dressing Details (indicate cue type and reason): With donning gown Where Assessed - Upper Body Dressing: Sitting, bed Lower Body Dressing: Simulated;Minimal assistance Where Assessed - Lower Body Dressing: Sit to stand from bed Toilet Transfer: Performed;Supervision/safety Toilet Transfer Method: Stand pivot Toilet Transfer Equipment: Bedside commode Toileting - Clothing Manipulation: Performed;Set up Toileting - Clothing Manipulation Details (indicate cue type and reason): With gowns Where Assessed - Toileting Clothing Manipulation: Sit to stand from 3-in-1 or toilet Toileting - Hygiene: Set up;Simulated Where Assessed - Toileting Hygiene: Sit on 3-in-1 or toilet Tub/Shower Transfer: Not assessed Tub/Shower Transfer Method: Not assessed ADL Comments: Pt. provided with mod verbal cues throughout to follow sternal precautions with mobility and ADLs Vision/Perception  Vision - History Baseline Vision: No visual deficits Patient Visual Report: No change from baseline Vision - Assessment Eye Alignment: Within Functional Limits Vision Assessment: Vision not tested Cognition Cognition Arousal/Alertness: Awake/alert Overall Cognitive Status: Appears within functional limits for tasks assessed Orientation Level: Oriented  X4 Sensation/Coordination Sensation Light Touch: Appears Intact Stereognosis: Not tested Hot/Cold: Not tested Proprioception: Not tested Coordination Gross Motor Movements are Fluid and Coordinated: Yes Fine Motor Movements are Fluid and Coordinated: Yes Extremity Assessment RUE Assessment RUE Assessment: Within Functional Limits LUE Assessment LUE Assessment: Within Functional Limits Mobility  Bed Mobility Bed Mobility: Yes Rolling Right: 4: Min assist Rolling Right Details (indicate cue type and reason): Mod verbal and tactile cues for sternal precautions Right Sidelying to Sit: 4: Min assist;HOB flat Right Sidelying to Sit Details (indicate cue type and reason): Cues for use of pillow  Sit to Supine - Right: 5: Supervision Transfers Transfers: Yes Sit to Stand: 5: Supervision Sit to Stand Details (indicate cue type and reason): Min verbal cues for use of holding pillow and rocking to achieve upright position Stand to Sit: 5: Supervision Stand to Sit Details: Verbal cues for hand placement on knees    End of Session OT - End of Session Equipment Utilized During Treatment: Gait belt Activity Tolerance: Patient tolerated treatment well Patient left: in chair;with call bell in reach Nurse Communication: Mobility status for transfers General Behavior During Session: [facility] for tasks performed Cognition: [facility] for tasks performed   Zionna Homewood, OTR/L Pager [phone] 11/28/2010, 9:04 AM

## 2010-11-28 NOTE — Progress Notes (Signed)
CARDIAC REHAB PHASE I   PRE:  Rate/Rhythm:86 SR  BP:  Supine:   Sitting: 100/60  Standing:    SaO2: 92 % RA  MODE:  Ambulation: 550 ft   POST:  Rate/Rhythem: 91   BP:  Supine:   Sitting: 110/60  Standing:    SaO2: 92 % RA 1032-1100  Pt. tolerated  walk well with assist times  one and using walker. Gait steady. To chair after walk with call light in reach.  Beatrix Fetters

## 2010-11-28 NOTE — Progress Notes (Signed)
Pt returned to AFib/Flutter on evening of 11/11, rate-controlled in 90's-105.  Received a call around 0430 that patient had been sustaining HR in 120s.  Upon assessment, asymptomatic.  Patient stated that he felt well.  Other vitals stable, listed in flowsheets.  Received orders from Dr. Donata Clay.  Will continue to monitor.  Idelle Crouch. Konrad Dolores, RN

## 2010-11-29 MED FILL — Cefuroxime Sodium For Inj 750 MG: INTRAMUSCULAR | Qty: 750 | Status: AC

## 2010-11-29 MED FILL — Nitroglycerin IV Soln 200 MCG/ML in D5W: INTRAVENOUS | Qty: 250 | Status: AC

## 2010-11-29 MED FILL — Potassium Chloride Inj 2 mEq/ML: INTRAVENOUS | Qty: 40 | Status: AC

## 2010-11-29 MED FILL — Aminocaproic Acid Inj 250 MG/ML: INTRAVENOUS | Qty: 40 | Status: AC

## 2010-11-29 MED FILL — Dopamine in Dextrose 5% Inj 3.2 MG/ML: INTRAVENOUS | Qty: 250 | Status: AC

## 2010-11-29 MED FILL — Dexmedetomidine HCl IV Soln 200 MCG/2ML: INTRAVENOUS | Qty: 4 | Status: AC

## 2010-11-29 MED FILL — Cefuroxime Sodium For Inj 1.5 GM: INTRAMUSCULAR | Qty: 1.5 | Status: AC

## 2010-11-29 MED FILL — Insulin Regular (Human) Inj 100 Unit/ML: INTRAMUSCULAR | Qty: 10 | Status: AC

## 2010-11-29 MED FILL — Magnesium Sulfate Inj 50%: INTRAMUSCULAR | Qty: 2 | Status: AC

## 2010-11-29 MED FILL — Epinephrine HCl Inj 1 MG/ML: INTRAMUSCULAR | Qty: 4 | Status: AC

## 2010-11-29 MED FILL — Phenylephrine HCl Inj 10 MG/ML: INTRAMUSCULAR | Qty: 1 | Status: AC

## 2010-11-29 MED FILL — Papaverine HCl Inj 30 MG/ML: INTRAMUSCULAR | Qty: 2 | Status: AC

## 2010-11-29 MED FILL — Heparin Sodium (Porcine) Inj 5000 Unit/ML: INTRAMUSCULAR | Qty: 1 | Status: AC

## 2010-12-01 ENCOUNTER — Encounter: Payer: Self-pay | Admitting: *Deleted

## 2010-12-12 ENCOUNTER — Encounter: Payer: Self-pay | Admitting: *Deleted

## 2010-12-12 ENCOUNTER — Ambulatory Visit (INDEPENDENT_AMBULATORY_CARE_PROVIDER_SITE_OTHER): Payer: Self-pay | Admitting: Thoracic Surgery (Cardiothoracic Vascular Surgery)

## 2010-12-12 VITALS — BP 106/66 | HR 82 | Resp 18 | Ht 65.0 in | Wt 150.0 lb

## 2010-12-12 DIAGNOSIS — Z951 Presence of aortocoronary bypass graft: Secondary | ICD-10-CM | POA: Insufficient documentation

## 2010-12-12 DIAGNOSIS — I2510 Atherosclerotic heart disease of native coronary artery without angina pectoris: Secondary | ICD-10-CM

## 2010-12-12 MED ORDER — AMIODARONE HCL 200 MG PO TABS: 200.0000 mg | ORAL_TABLET | Freq: Every day | ORAL | Status: DC

## 2010-12-12 NOTE — Progress Notes (Signed)
Jacob Hampton presents today for removal of his stay sutures and regular sutures from his redo sternotomy incision.  Dr. Cornelius Moras observed all of his well healing sites---sternotomy incision, left radial incision and ct sites. I removed the sutures and stay sutures without any difficulty.

## 2010-12-12 NOTE — Progress Notes (Signed)
[address]            [place] [address]          [phone]     CARDIOTHORACIC SURGERY OFFICE NOTE  Referring Provider is [name]., * PCP is Provider Not In System   HPI:  Patient returns for follow-up having undergone redo CABG x2 on 11/22/2010.  Their early postoperative hospital course was notable for postoperative atrial fibrillation for which he was treated with amiodarone.  Since hospital discharge he has made excellent progress.  His appetite is slowly improving. He has mild soreness in his chest which is improving. He has no shortness of breath. His activity level is quite good for his age.   Current Outpatient Prescriptions  Medication Sig Dispense Refill  . amiodarone (PACERONE) 200 MG tablet Take 1 tablet (200 mg total) by mouth 2 (two) times daily.  60 tablet  1  . aspirin 325 MG EC tablet Take 325 mg by mouth daily.        Marland Kitchen atorvastatin (LIPITOR) 80 MG tablet Take 80 mg by mouth at bedtime.       . folic acid (FOLVITE) 1 MG tablet Take 1 mg by mouth daily.        . furosemide (LASIX) 40 MG tablet Take 40 mg by mouth daily.        . metoprolol tartrate (LOPRESSOR) 25 MG tablet Take 1 tablet (25 mg total) by mouth 2 (two) times daily.  60 tablet  1  . nystatin (MYCOSTATIN) 100000 UNIT/ML suspension Take 500,000 Units by mouth 4 (four) times daily.        . potassium chloride SA (K-DUR,KLOR-CON) 20 MEQ tablet Take 20 mEq by mouth daily.        . metoprolol (TOPROL-XL) 100 MG 24 hr tablet Take 50 mg by mouth daily.        . rosuvastatin (CRESTOR) 20 MG tablet Take 1 tablet (20 mg total) by mouth daily at 6 PM.  30 tablet  1  . traMADol (ULTRAM) 50 MG tablet Take 50 mg by mouth every 6 (six) hours as needed. Maximum dose= 8 tablets per day           Physical Exam:   BP 106/66  Pulse 82  Resp 18  Ht 5\' 5"  (1.651 m)  Wt 150 lb (68.04 kg)  BMI 24.96 kg/m2  SpO2 97%  Well-appearing elderly gentleman. All the surgical  incisions are healing nicely and all the sutures are removed today. The sternum is stable on palpation. Breath sounds are clear to auscultation and symmetrical bilaterally. The left radial artery harvest incision is also healing nicely. Extremities warm and well-perfused. There is no lower extremity edema. The remainder of his physical exam is unremarkable.  Diagnostic Tests:  n/a  Impression:  Patient is progressing quite well following redo coronary artery bypass grafting 11/22/2010. All the surgical incisions are healing nicely and all the sutures have been removed. I do not feel that he needs any diuretic therapy at this time. He appears to be maintaining sinus rhythm.  Plan:  I've instructed the patient cut his dose of amiodarone back to 200 mg daily. I've also instructed the patient to stop taking Lasix and potassium. I've encouraged him to continue to increase his ambulation as tolerated. We will see him back in 3 weeks to make sure he continues to  do well.    Salvatore Decent. Cornelius Moras, MD

## 2010-12-13 NOTE — Cardiovascular Report (Signed)
Jacob Hampton, Jacob Hampton NO.:  [id number]  MEDICAL RECORD NO.:  [id number]  LOCATION:  4703                         FACILITY:  [facility]  PHYSICIAN:  Georga Hacking, M.D.DATE OF BIRTH:  1924/07/30  DATE OF PROCEDURE:  11/16/2010 DATE OF DISCHARGE:  11/17/2010                           CARDIAC CATHETERIZATION   HISTORY:  An 77 year old male with previous bypass grafting who presented with unstable angina.  PROCEDURE:  Left heart catheterization with coronary angiograms and left ventriculogram, visualization of mammary artery, and previous bypass grafts.  DESCRIPTION OF PROCEDURE:  The patient was brought to the catheterization laboratory and was prepped and draped in the usual manner.  After Xylocaine anesthesia, a 5-French sheath was placed in the right femoral artery percutaneously using a single anterior needle wall stick.  Coronary arteries were selected and then the grafts were visualized with right bypass catheter and right coronary catheters. Internal mammary graft was visualized.  A 30-mL ventriculogram was performed.  Sheath was removed in the holding area.  He tolerated the procedure well.  HEMODYNAMIC DATA:  Aortic postcontrast 137/60.  LV postcontrast 137/10- 15.  ANGIOGRAPHIC DATA:  Left ventriculogram:  Performed in the 30-degree RAO projection.  The aortic valve is normal.  The mitral valve is normal. The left ventricle is normal in size.  Estimated ejection fraction is 60%.  Coronary arteries:  Arise and distribute normally.  The left main coronary artery has a severe 99% calcified eccentric stenosis of the distal left main prior to the origin of a diagonal branch.  The LAD fills after this and has a small diagonal branch with a segmental 80%- 90% stenosis in the LAD prior to a septal branch and then a 99% stenosis prior to a small diagonal branch.  The circumflex comes off after the left main stenosis and has a large marginal branch, which  is unprotected.  The right coronary artery has a severe 99% proximal stenosis and then 60%-70% midvessel stenosis and is a large vessel.  The saphenous vein graft to the right coronary artery is occluded.  The saphenous vein graft to the obtuse marginal artery is occluded.  The saphenous vein graft to the diagonal branch is occluded.  The internal mammary graft to LAD is widely patent.  There is a large accessory branch coming off the internal mammary that is also visualized.  IMPRESSION: 1. Severe native left main coronary artery disease with unprotected     circumflex marginal branch and diagonal branch. 2. Severe proximal right coronary artery disease and midvessel     disease. 3. Interval occlusion of all 3 native saphenous vein grafts. 4. Patent mammary graft.  RECOMMENDATIONS:  The films were reviewed with an interventional cardiologist.  The left main coronary artery was heavily calcified and has a right angle bend into the circumflex.  The patient will have a cardiovascular consultation to determine if he would be a candidate for repeat bypass grafting.  He could be considered for high-risk percutaneous therapy if not acceptable candidate for surgery.     Georga Hacking, M.D.     WST/MEDQ  D:  12/12/2010  T:  12/13/2010  Job:  [id number]

## 2010-12-19 ENCOUNTER — Ambulatory Visit: Payer: Medicare Other | Admitting: Thoracic Surgery (Cardiothoracic Vascular Surgery)

## 2010-12-21 ENCOUNTER — Encounter: Payer: Self-pay | Admitting: *Deleted

## 2010-12-22 ENCOUNTER — Encounter: Payer: Self-pay | Admitting: *Deleted

## 2010-12-23 ENCOUNTER — Encounter: Payer: Self-pay | Admitting: *Deleted

## 2010-12-23 ENCOUNTER — Other Ambulatory Visit: Payer: Self-pay | Admitting: *Deleted

## 2010-12-23 DIAGNOSIS — B370 Candidal stomatitis: Secondary | ICD-10-CM

## 2010-12-23 MED ORDER — NYSTATIN 100000 UNIT/ML MT SUSP: 500000.0000 [IU] | Freq: Four times a day (QID) | OROMUCOSAL | Status: DC

## 2010-12-29 ENCOUNTER — Other Ambulatory Visit: Payer: Self-pay | Admitting: Thoracic Surgery (Cardiothoracic Vascular Surgery)

## 2011-01-02 ENCOUNTER — Ambulatory Visit (INDEPENDENT_AMBULATORY_CARE_PROVIDER_SITE_OTHER): Payer: Self-pay | Admitting: Thoracic Surgery (Cardiothoracic Vascular Surgery)

## 2011-01-02 ENCOUNTER — Encounter: Payer: Self-pay | Admitting: Thoracic Surgery (Cardiothoracic Vascular Surgery)

## 2011-01-02 ENCOUNTER — Ambulatory Visit
Admission: RE | Admit: 2011-01-02 | Discharge: 2011-01-02 | Disposition: A | Discharge status: Home / Self Care | Payer: Medicare Other | Source: Ambulatory Visit | Attending: Thoracic Surgery (Cardiothoracic Vascular Surgery) | Admitting: Thoracic Surgery (Cardiothoracic Vascular Surgery)

## 2011-01-02 VITALS — BP 112/74 | HR 104 | Temp 99.2°F | Resp 24 | Ht 65.0 in | Wt 151.0 lb

## 2011-01-02 DIAGNOSIS — Z951 Presence of aortocoronary bypass graft: Secondary | ICD-10-CM

## 2011-01-02 DIAGNOSIS — J40 Bronchitis, not specified as acute or chronic: Secondary | ICD-10-CM

## 2011-01-02 DIAGNOSIS — R059 Cough, unspecified: Secondary | ICD-10-CM

## 2011-01-02 DIAGNOSIS — I2510 Atherosclerotic heart disease of native coronary artery without angina pectoris: Secondary | ICD-10-CM

## 2011-01-02 DIAGNOSIS — R05 Cough: Secondary | ICD-10-CM

## 2011-01-02 MED ORDER — AZITHROMYCIN 250 MG PO TABS: ORAL_TABLET | ORAL | Status: AC

## 2011-01-02 NOTE — Progress Notes (Signed)
[address]            [place] [address]          [phone]     CARDIOTHORACIC SURGERY OFFICE NOTE  Referring Provider is Elliot Gault Eilleen Kempf., * PCP is Provider Not In System   HPI:  Patient returns for follow-up following redo coronary artery bypass grafting x2. He was last seen in the office 2 weeks ago which time he was progressing fairly well. The patient reports that he had been doing very well up until 3 days ago when he began to develop a productive cough. He states that he had been around somebody in a restaurant with coughing a lot and both he and another family member had since then developed a persistent cough productive of thick yellowish sputum. The increased cough is causing to have some increased soreness in his chest. He has not had any shortness of breath. His appetite is marginal. He reports that his activity level had been quite good until the last few days. He does not feel like he is having fevers he has had some diaphoresis. The remainder of his review of systems unremarkable.   Current Outpatient Prescriptions  Medication Sig Dispense Refill  . amiodarone (PACERONE) 200 MG tablet Take 1 tablet (200 mg total) by mouth daily.  60 tablet  1  . aspirin 325 MG EC tablet Take 325 mg by mouth daily.        Marland Kitchen atorvastatin (LIPITOR) 80 MG tablet Take 80 mg by mouth at bedtime.       . folic acid (FOLVITE) 1 MG tablet Take 1 mg by mouth daily.        Marland Kitchen guaiFENesin-dextromethorphan (ROBITUSSIN DM) 100-10 MG/5ML syrup Take 5 mLs by mouth 3 (three) times daily as needed.        . metoprolol (TOPROL-XL) 100 MG 24 hr tablet Take 50 mg by mouth daily.        Marland Kitchen nystatin (MYCOSTATIN) 100000 UNIT/ML suspension Take 5 mLs (500,000 Units total) by mouth 4 (four) times daily.  120 mL  0  . traMADol (ULTRAM) 50 MG tablet Take 50 mg by mouth every 6 (six) hours as needed. Maximum dose= 8 tablets per day       . azithromycin (ZITHROMAX Z-PAK) 250 MG  tablet Take two tablets by mouth today then one tablet daily until gone  6 each  0      Physical Exam:   BP 112/74  Pulse 104  Temp(Src) 99.2 F (37.3 C) (Oral)  Resp 24  Ht 5\' 5"  (1.651 m)  Wt 151 lb (68.493 kg)  BMI 25.13 kg/m2  SpO2 95%  HEENT:  Unremarkable  Chest:   Clear to auscultation with slightly diminished breath sounds left lung base. No wheezes rales or rhonchi are noted.  CV:   Regular rate and rhythm  Abdomen:  Soft and nontender  Extremities:  Warm and well-perfused there is no lower extremity edema  Diagnostic Tests:  Chest x-ray performed today demonstrates tiny residual left pleural effusion associated with slight elevation of left hemidiaphragm. This is essentially unchanged since his last x-ray. There are no pulmonary parenchymal abnormalities to suggest the development of pneumonia.   Impression:  Overall the patient seems to be progressing well except for the fact that over the last few days he has developed progressive cough and malaise suspicious for  bronchitis.  Plan:  We will prescribe the patient 1 Z-pack for presumed bronchitis. I've encouraged patient to continue to work on gradually increasing his physical activity as tolerated. He states that he has an appointment to see Dr. Donnie Aho on December 27. I've encouraged the patient to call and return to see [place] between now and then if his cough worsens or if he develops fever greater than 101. We will otherwise plan to see the patient back for further followup in 2 months.   Salvatore Decent. Cornelius Moras, MD 01/02/2011 3:50 PM

## 2011-01-02 NOTE — Patient Instructions (Signed)
Take 1 Z-pack.  Contact Dr. York Spaniel office if your cough doesn't resolve or if symptoms worsen.

## 2011-01-06 ENCOUNTER — Encounter: Payer: Self-pay | Admitting: *Deleted

## 2011-02-17 HISTORY — PX: CORONARY ANGIOPLASTY WITH STENT PLACEMENT: SHX49

## 2011-02-28 ENCOUNTER — Inpatient Hospital Stay (HOSPITAL_COMMUNITY)
Admission: EM | Admit: 2011-02-28 | Discharge: 2011-03-03 | DRG: 249 | Disposition: A | Discharge status: Home / Self Care | Payer: Medicare Other | Source: Ambulatory Visit | Attending: Cardiology | Admitting: Cardiology

## 2011-02-28 ENCOUNTER — Emergency Department (HOSPITAL_COMMUNITY): Payer: Medicare Other

## 2011-02-28 ENCOUNTER — Other Ambulatory Visit: Payer: Self-pay

## 2011-02-28 ENCOUNTER — Encounter: Payer: Self-pay | Admitting: Cardiology

## 2011-02-28 DIAGNOSIS — M129 Arthropathy, unspecified: Secondary | ICD-10-CM | POA: Diagnosis present

## 2011-02-28 DIAGNOSIS — I10 Essential (primary) hypertension: Secondary | ICD-10-CM | POA: Diagnosis present

## 2011-02-28 DIAGNOSIS — I2510 Atherosclerotic heart disease of native coronary artery without angina pectoris: Secondary | ICD-10-CM | POA: Diagnosis present

## 2011-02-28 DIAGNOSIS — E785 Hyperlipidemia, unspecified: Secondary | ICD-10-CM | POA: Diagnosis present

## 2011-02-28 DIAGNOSIS — Z79899 Other long term (current) drug therapy: Secondary | ICD-10-CM

## 2011-02-28 DIAGNOSIS — Z7982 Long term (current) use of aspirin: Secondary | ICD-10-CM

## 2011-02-28 DIAGNOSIS — D649 Anemia, unspecified: Secondary | ICD-10-CM | POA: Diagnosis present

## 2011-02-28 DIAGNOSIS — Z8611 Personal history of tuberculosis: Secondary | ICD-10-CM

## 2011-02-28 DIAGNOSIS — I25810 Atherosclerosis of coronary artery bypass graft(s) without angina pectoris: Principal | ICD-10-CM | POA: Diagnosis present

## 2011-02-28 DIAGNOSIS — I200 Unstable angina: Secondary | ICD-10-CM

## 2011-02-28 DIAGNOSIS — K219 Gastro-esophageal reflux disease without esophagitis: Secondary | ICD-10-CM | POA: Diagnosis present

## 2011-02-28 DIAGNOSIS — Z961 Presence of intraocular lens: Secondary | ICD-10-CM

## 2011-02-28 DIAGNOSIS — Z87891 Personal history of nicotine dependence: Secondary | ICD-10-CM

## 2011-02-28 DIAGNOSIS — I4891 Unspecified atrial fibrillation: Secondary | ICD-10-CM

## 2011-02-28 LAB — COMPREHENSIVE METABOLIC PANEL
ALT: 16 U/L (ref 0–53)
Albumin: 3.1 g/dL — ABNORMAL LOW (ref 3.5–5.2)
Alkaline Phosphatase: 67 U/L (ref 39–117)
Chloride: 108 mEq/L (ref 96–112)
GFR calc Af Amer: 74 mL/min — ABNORMAL LOW (ref >90)
Glucose, Bld: 91 mg/dL (ref 70–99)
Potassium: 4.4 mEq/L (ref 3.5–5.1)
Sodium: 140 mEq/L (ref 135–145)
Total Protein: 6.1 g/dL (ref 6.0–8.3)

## 2011-02-28 LAB — POCT I-STAT TROPONIN I: Troponin i, poc: 0 ng/mL (ref 0.00–0.08)

## 2011-02-28 LAB — DIFFERENTIAL
Eosinophils Absolute: 0.2 10*3/uL (ref 0.0–0.7)
Lymphocytes Relative: 27 % (ref 12–46)
Lymphs Abs: 1.6 10*3/uL (ref 0.7–4.0)
Neutro Abs: 3.5 10*3/uL (ref 1.7–7.7)
Neutrophils Relative %: 62 % (ref 43–77)

## 2011-02-28 LAB — CARDIAC PANEL(CRET KIN+CKTOT+MB+TROPI)
Relative Index: INVALID (ref 0.0–2.5)
Total CK: 68 U/L (ref 7–232)

## 2011-02-28 LAB — CBC
MCH: 32.3 pg (ref 26.0–34.0)
Platelets: 136 10*3/uL — ABNORMAL LOW (ref 150–400)
RBC: 3.31 MIL/uL — ABNORMAL LOW (ref 4.22–5.81)
WBC: 5.7 10*3/uL (ref 4.0–10.5)

## 2011-02-28 LAB — PROTIME-INR
INR: 1.15 (ref 0.00–1.49)
Prothrombin Time: 14.9 seconds (ref 11.6–15.2)

## 2011-02-28 LAB — APTT: aPTT: 31 seconds (ref 24–37)

## 2011-02-28 MED ORDER — SODIUM CHLORIDE 0.9 % IV SOLN: 250.0000 mL | INTRAVENOUS | Status: DC | PRN

## 2011-02-28 MED ORDER — PANTOPRAZOLE SODIUM 40 MG PO TBEC
40.0000 mg | DELAYED_RELEASE_TABLET | Freq: Every day | ORAL | Status: DC
  Administered 2011-03-01 – 2011-03-03 (×3): 40 mg via ORAL
  Filled 2011-02-28 (×3): qty 1

## 2011-02-28 MED ORDER — HEPARIN BOLUS VIA INFUSION
3000.0000 [IU] | Freq: Once | INTRAVENOUS | Status: AC
  Administered 2011-02-28: 3000 [IU] via INTRAVENOUS

## 2011-02-28 MED ORDER — SODIUM CHLORIDE 0.9 % IV SOLN
INTRAVENOUS | Status: DC
  Administered 2011-03-01: 04:00:00 via INTRAVENOUS

## 2011-02-28 MED ORDER — AMIODARONE HCL 200 MG PO TABS
200.0000 mg | ORAL_TABLET | Freq: Every day | ORAL | Status: DC
  Administered 2011-03-01 – 2011-03-03 (×3): 200 mg via ORAL
  Filled 2011-02-28 (×3): qty 1

## 2011-02-28 MED ORDER — ASPIRIN EC 325 MG PO TBEC: 325.0000 mg | DELAYED_RELEASE_TABLET | Freq: Every day | ORAL | Status: DC

## 2011-02-28 MED ORDER — ASPIRIN EC 325 MG PO TBEC
325.0000 mg | DELAYED_RELEASE_TABLET | Freq: Every day | ORAL | Status: DC
  Administered 2011-03-01 – 2011-03-03 (×3): 325 mg via ORAL
  Filled 2011-02-28 (×3): qty 1

## 2011-02-28 MED ORDER — SODIUM CHLORIDE 0.9 % IJ SOLN
3.0000 mL | Freq: Two times a day (BID) | INTRAMUSCULAR | Status: DC
  Administered 2011-03-01 – 2011-03-03 (×3): 3 mL via INTRAVENOUS

## 2011-02-28 MED ORDER — ASPIRIN 81 MG PO CHEW: 324.0000 mg | CHEWABLE_TABLET | ORAL | Status: DC

## 2011-02-28 MED ORDER — ISOSORBIDE MONONITRATE ER 60 MG PO TB24
60.0000 mg | ORAL_TABLET | Freq: Every day | ORAL | Status: DC
  Administered 2011-03-01 – 2011-03-03 (×3): 60 mg via ORAL
  Filled 2011-02-28 (×3): qty 1

## 2011-02-28 MED ORDER — ROSUVASTATIN CALCIUM 20 MG PO TABS
20.0000 mg | ORAL_TABLET | Freq: Every day | ORAL | Status: DC
  Administered 2011-03-01 – 2011-03-03 (×3): 20 mg via ORAL
  Filled 2011-02-28 (×4): qty 1

## 2011-02-28 MED ORDER — ONDANSETRON HCL 4 MG/2ML IJ SOLN: 4.0000 mg | Freq: Four times a day (QID) | INTRAMUSCULAR | Status: DC | PRN

## 2011-02-28 MED ORDER — SODIUM CHLORIDE 0.9 % IJ SOLN: 3.0000 mL | INTRAMUSCULAR | Status: DC | PRN

## 2011-02-28 MED ORDER — NITROGLYCERIN 0.4 MG SL SUBL: 0.4000 mg | SUBLINGUAL_TABLET | SUBLINGUAL | Status: DC | PRN

## 2011-02-28 MED ORDER — ASPIRIN 300 MG RE SUPP
300.0000 mg | RECTAL | Status: DC
  Filled 2011-02-28: qty 1

## 2011-02-28 MED ORDER — FOLIC ACID 1 MG PO TABS
1.0000 mg | ORAL_TABLET | Freq: Every day | ORAL | Status: DC
  Administered 2011-03-01 – 2011-03-03 (×3): 1 mg via ORAL
  Filled 2011-02-28 (×3): qty 1

## 2011-02-28 MED ORDER — HEPARIN SOD (PORCINE) IN D5W 100 UNIT/ML IV SOLN
800.0000 [IU]/h | INTRAVENOUS | Status: DC
  Administered 2011-02-28: 800 [IU]/h via INTRAVENOUS
  Filled 2011-02-28 (×3): qty 250

## 2011-02-28 MED ORDER — ACETAMINOPHEN 325 MG PO TABS: 650.0000 mg | ORAL_TABLET | ORAL | Status: DC | PRN

## 2011-02-28 MED ORDER — DIAZEPAM 5 MG PO TABS
5.0000 mg | ORAL_TABLET | ORAL | Status: AC
  Administered 2011-03-01: 5 mg via ORAL
  Filled 2011-02-28: qty 1

## 2011-02-28 MED ORDER — METOPROLOL SUCCINATE ER 50 MG PO TB24
50.0000 mg | ORAL_TABLET | Freq: Every day | ORAL | Status: DC
  Administered 2011-03-01 – 2011-03-03 (×3): 50 mg via ORAL
  Filled 2011-02-28 (×3): qty 1

## 2011-02-28 NOTE — H&P (Addendum)
Admit date: 02/28/2011 Name:  Jacob Hampton Medical record number: [id number] DOB/Age:  [id number]  78 y.o.  Primary Cardiologist: Dr. Viann Fish  Chief complaint/reason for admission:  Worsening chest pain  HPI:  This 78 year old male had redo bypass grafting for significant unstable angina in November. His hospital course was complicated by atrial fibrillation and he had really been doing well until recently when he developed recurrent chest discomfort associated with shortness of breath. He was difficult to tell whether this is angina or whether it was something else. His amiodarome had been reduced and he was placed on a proton pump inhibitor as well as increased Imdur. He is continued to have chest discomfort which he would use nitroglycerin for and has had multiple episodes in the past week some occurring at rest and some with exertion. He has had 3 episodes today relief with multiple nitroglycerin. Last episode occurred while he was visiting his wife's grave and associated with significant shortness of breath or heart several nitroglycerin for relief. He was advised to come to the emergency room. He is currently pain-free. He started having chest discomfort began about 3 weeks ago. He does have a history of significant GI bleeding in the past. He has required transfusions and has had previous surgery for this.  At the time of his catheterization prior to bypass surgery he was found to have a previous mammary graft that was patent. His vein grafts were all occluded. He had a severe distal left main coronary artery stenosis and fed the circumflex and diagonal branch but was at a right angle bend and was calcified. He also had a severe 99% proximal right coronary artery stenosis.   Past Medical History  Diagnosis Date  . Hyperlipidemia   . History of upper GI bleeding     8 units for GI bleed   . History of primary tuberculosis   . CAD (coronary artery disease)     Dr. Donnie Aho is his  Cardiologist  . Blood transfusion     Had 8 units for GI bleed  . Pneumonia     h/o walking pneumonia at least 20 years ago  . GERD (gastroesophageal reflux disease)   . Hypertension   . Tuberculosis 1969    Was in sanitorium for 1 year  . Sinus drainage     11/24/10 "every morning when I get up I have to clear it out"  . Sleep disorder     "trouble staying asleep"  . Dizzy     "when I take my blood pressure medicine sometimes it makes me a little dizzy"  . Anemia   . Arthritis       Past Surgical History  Procedure Date  . Partial gastrectomy   . Hernia repair   . Right testicle surgery   . Other surgical history 03/25/1999    reexploration for bleeding s/p CABG - coagulopathy without mechanical source  . Redo cabg 11/22/2010    CABG x2 with left radial to OM and SVG to RCA  . Coronary artery bypass graft 03/25/1999    CABG x5 using LIMA to LAD, SVG to Diag, SVG to OM, sequential SVG to PD and RPL, open SV harvest from left thigh and leg  . Cholecystectomy     "w/bleeding ulcer OR"  . Bleeding ulcer     w/gallbladder  . Appendectomy   . Cataract extraction w/ intraocular lens implant     bilaterally  .   Allergies:  has no known allergies.  Medications: Family History:  Prior to Admission medications   Medication Sig Start Date End Date Taking? Authorizing Provider  amiodarone (PACERONE) 200 MG tablet Take 200 mg by mouth daily.   Yes Historical Provider, MD  aspirin 325 MG EC tablet Take 325 mg by mouth daily.     Yes Historical Provider, MD  atorvastatin (LIPITOR) 80 MG tablet Take 80 mg by mouth at bedtime.    Yes Historical Provider, MD  folic acid (FOLVITE) 1 MG tablet Take 1 mg by mouth daily.     Yes Historical Provider, MD  isosorbide mononitrate (IMDUR) 60 MG 24 hr tablet Take 60 mg by mouth daily.   Yes Historical Provider, MD  metoprolol (TOPROL-XL) 100 MG 24 hr tablet Take 50 mg by mouth daily.     Yes Historical Provider, MD  nitroGLYCERIN (NITROSTAT)  0.4 MG SL tablet Place 0.4 mg under the tongue every 5 (five) minutes as needed.   Yes Historical Provider, MD  pantoprazole (PROTONIX) 40 MG tablet Take 40 mg by mouth daily.   Yes Historical Provider, MD  .  Family history:  indicated that his mother is deceased. He indicated that his father is deceased. He indicated that his sister is alive. He indicated that two of his four brothers are alive.   Social History:   reports that he quit smoking about 50 years ago. His smoking use included Cigarettes. He has a 2.5 pack-year smoking history. He has never used smokeless tobacco. He reports that he does not drink alcohol or use illicit drugs.   History   Social History Narrative   Retired [employer], 2 sons, 2 daughtersBowls regularly.     Review of Systems: Other than as noted above, the remainder of the review of systems is normal  Physical Exam: BP 104/69  Pulse 69  Temp 98.2 F (36.8 C)  Resp 16  SpO2 98% General appearance: alert, cooperative and no distress Head: Normocephalic, without obvious abnormality, atraumatic Ears: Normal Throat: Edentulous Neck: no adenopathy, no carotid bruit, no JVD and supple, symmetrical, trachea midline Lungs: clear to auscultation bilaterally Heart: regular rate and rhythm, S1, S2 normal and no S3 or S4 Abdomen: soft, non-tender; bowel sounds normal; no masses,  no organomegaly Rectal: deferred Extremities: extremities normal, atraumatic, no cyanosis or edema Pulses: 2+ and symmetric Skin: Skin color, texture, turgor normal. No rashes or lesions Neurologic: Grossly normal  EKG: Sinus rhythm with minor nonspecific ST changes  IMPRESSIONS: 1. Unstable angina pectoris 2. Coronary artery disease with recent redo bypass grafting 3. Previous history of GI bleeding 4. Previous history of postoperative atrial fibrillation   PLAN: Admit to telemetry. Initiate intravenous heparin. Continue aspirin. He will need to go  ahead with cardiac catheterization to exclude an early graft failure.Cardiac catheterization was discussed with the patient fully including risks of myocardial infarction, death, stroke, bleeding, arrhythmia, dye allergy, renal insufficiency or bleeding.  The patient understands and is willing to proceed. Possibility of percutaneous intervention also discussed with patient. Intravenous nitroglycerin.   Signed: [name]. MD [facility] 02/28/2011, 5:05 PM     Patient seen and examined.  No interval change in history and exam since yesterday..  Stable for procedure.  [name]. MD [facility]  03/01/2011

## 2011-02-28 NOTE — Progress Notes (Addendum)
ANTICOAGULATION CONSULT NOTE - Initial Consult  Pharmacy Consult for heparin Indication: chest pain/ACS  No Known Allergies  Patient Measurements:   Heparin Dosing Weight: 68.5  Vital Signs: Temp: 98.2 F (36.8 C) (02/12 1620) BP: 104/69 mmHg (02/12 1620) Pulse Rate: 69  (02/12 1620)  Labs:  Basename 02/28/11 1719  HGB 10.7*  HCT 32.6*  PLT 136*  APTT --  LABPROT --  INR --  HEPARINUNFRC --  CREATININE --  CKTOTAL --  CKMB --  TROPONINI --   The CrCl is unknown because both a height and weight (above a minimum accepted value) are required for this calculation.  Medical History: Past Medical History  Diagnosis Date  . Hyperlipidemia   . History of upper GI bleeding     8 units for GI bleed   . History of primary tuberculosis   . CAD (coronary artery disease)     Dr. Donnie Aho is his Cardiologist  . Blood transfusion     Had 8 units for GI bleed  . Pneumonia     h/o walking pneumonia at least 20 years ago  . GERD (gastroesophageal reflux disease)   . Hypertension   . Tuberculosis 1969    Was in sanitorium for 1 year  . Sinus drainage     11/24/10 "every morning when I get up I have to clear it out"  . Sleep disorder     "trouble staying asleep"  . Dizzy     "when I take my blood pressure medicine sometimes it makes me a little dizzy"  . Anemia   . Arthritis     Medications:  Medications Prior to Admission  Medication Dose Route Frequency Provider Last Rate Last Dose  . 0.9 %  sodium chloride infusion  250 mL Intravenous PRN W Ashley Royalty., MD      . acetaminophen (TYLENOL) tablet 650 mg  650 mg Oral Q4H PRN W Ashley Royalty., MD      . amiodarone (PACERONE) tablet 200 mg  200 mg Oral Daily W Ashley Royalty., MD      . aspirin chewable tablet 324 mg  324 mg Oral NOW W Ashley Royalty., MD       Or  . aspirin suppository 300 mg  300 mg Rectal NOW W Ashley Royalty., MD      . aspirin EC tablet 325 mg  325 mg Oral Daily W Ashley Royalty., MD      . folic acid (FOLVITE) tablet 1 mg  1 mg Oral Daily W Ashley Royalty., MD      . isosorbide mononitrate (IMDUR) 24 hr tablet 60 mg  60 mg Oral Daily W Ashley Royalty., MD      . metoprolol succinate (TOPROL-XL) 24 hr tablet 50 mg  50 mg Oral Daily W Ashley Royalty., MD      . nitroGLYCERIN (NITROSTAT) SL tablet 0.4 mg  0.4 mg Sublingual Q5 min PRN W Ashley Royalty., MD      . ondansetron [facility]) injection 4 mg  4 mg Intravenous Q6H PRN W Ashley Royalty., MD      . pantoprazole (PROTONIX) EC tablet 40 mg  40 mg Oral Daily W Ashley Royalty., MD      . rosuvastatin (CRESTOR) tablet 20 mg  20 mg Oral Daily W Ashley Royalty., MD      . sodium chloride 0.9 % injection 3 mL  3 mL Intravenous Q12H W Karleen Hampshire  Lynnell Grain., MD      . sodium chloride 0.9 % injection 3 mL  3 mL Intravenous PRN W Ashley Royalty., MD       Medications Prior to Admission  Medication Sig Dispense Refill  . aspirin 325 MG EC tablet Take 325 mg by mouth daily.        Marland Kitchen atorvastatin (LIPITOR) 80 MG tablet Take 80 mg by mouth at bedtime.       . folic acid (FOLVITE) 1 MG tablet Take 1 mg by mouth daily.        . metoprolol (TOPROL-XL) 100 MG 24 hr tablet Take 50 mg by mouth daily.          Assessment: 78 yo male with known hx CAD, CABG admitted with chest pain.  Planning for cath lab soon.  Labs pending.  Does have history of GI bleed in the past, will dose a little less aggressively and monitor for bleeding.  Goal of Therapy:  Heparin level 0.3-0.7 units/ml   Plan:  1. Start IV heparin with 3000 units IV x 1, then start heparin gtt at 800 units/hr. 2. Check heparin level 8 hrs after gtt starts. 3. Daily heparin level and CBC, f/u plans for cath.  Gardner Candle 02/28/2011,6:00 PM

## 2011-02-28 NOTE — ED Provider Notes (Signed)
Patient reports to ED today for admission to Dr. Donnie Aho.  Dr. Donnie Aho at bedside upon patient's arrival.  Patient with frequent episodes of anginal type chest pain, most often associated with activity but several episodes today while at rest.  Pain typically relieved with NTG.  Patient without current complaint, is chest pain free at present.  Lungs CTA bilaterally.  S1/S2, RRR.  Abdomen soft, bowel sounds present.  Distal pulses palpated all extremities.  Dr. Donnie Aho writing admission orders.  Jimmye Norman, NP 02/28/11 Rickey Primus

## 2011-02-28 NOTE — ED Notes (Signed)
Pt is here for CP in mid chest for one week. This has been associated with sob.  Pain 9/10.  Currently pain free after nitro which he took one hour ago (he has taken 3 sl nitro today)

## 2011-03-01 ENCOUNTER — Encounter (HOSPITAL_COMMUNITY)
Admission: EM | Disposition: A | Discharge status: Home / Self Care | Payer: Self-pay | Source: Ambulatory Visit | Attending: Cardiology

## 2011-03-01 ENCOUNTER — Ambulatory Visit (HOSPITAL_COMMUNITY): Admit: 2011-03-01 | Payer: Self-pay | Admitting: Cardiology

## 2011-03-01 ENCOUNTER — Other Ambulatory Visit: Payer: Self-pay

## 2011-03-01 ENCOUNTER — Encounter (HOSPITAL_COMMUNITY): Payer: Self-pay | Admitting: Cardiology

## 2011-03-01 HISTORY — PX: LEFT HEART CATHETERIZATION WITH CORONARY/GRAFT ANGIOGRAM: SHX5450

## 2011-03-01 LAB — POCT ACTIVATED CLOTTING TIME: Activated Clotting Time: 413 seconds

## 2011-03-01 LAB — LIPID PANEL
Cholesterol: 117 mg/dL (ref 0–200)
Total CHOL/HDL Ratio: 2.8 RATIO

## 2011-03-01 LAB — CARDIAC PANEL(CRET KIN+CKTOT+MB+TROPI)
CK, MB: 2.4 ng/mL (ref 0.3–4.0)
Relative Index: INVALID (ref 0.0–2.5)
Total CK: 81 U/L (ref 7–232)
Troponin I: <0.3 ng/mL (ref <0.30)
Troponin I: <0.3 ng/mL (ref <0.30)

## 2011-03-01 LAB — HEPARIN LEVEL (UNFRACTIONATED): Heparin Unfractionated: 0.39 IU/mL (ref 0.30–0.70)

## 2011-03-01 SURGERY — LEFT HEART CATHETERIZATION WITH CORONARY/GRAFT ANGIOGRAM
Anesthesia: LOCAL

## 2011-03-01 MED ORDER — HEPARIN (PORCINE) IN NACL 2-0.9 UNIT/ML-% IJ SOLN
INTRAMUSCULAR | Status: AC
  Filled 2011-03-01: qty 2000

## 2011-03-01 MED ORDER — SODIUM CHLORIDE 0.9 % IJ SOLN
3.0000 mL | Freq: Two times a day (BID) | INTRAMUSCULAR | Status: DC
  Administered 2011-03-01: 3 mL via INTRAVENOUS

## 2011-03-01 MED ORDER — SODIUM CHLORIDE 0.9 % IV SOLN
INTRAVENOUS | Status: AC
  Administered 2011-03-01: 16:00:00 via INTRAVENOUS

## 2011-03-01 MED ORDER — CLOPIDOGREL BISULFATE 75 MG PO TABS: 75.0000 mg | ORAL_TABLET | Freq: Every day | ORAL | Status: DC

## 2011-03-01 MED ORDER — HEPARIN SOD (PORCINE) IN D5W 100 UNIT/ML IV SOLN
800.0000 [IU]/h | INTRAVENOUS | Status: DC
  Administered 2011-03-02: 800 [IU]/h via INTRAVENOUS
  Filled 2011-03-01: qty 250

## 2011-03-01 MED ORDER — SODIUM CHLORIDE 0.9 % IV SOLN
INTRAVENOUS | Status: DC
  Administered 2011-03-02: 05:00:00 via INTRAVENOUS

## 2011-03-01 MED ORDER — ONDANSETRON HCL 4 MG/2ML IJ SOLN: 4.0000 mg | Freq: Four times a day (QID) | INTRAMUSCULAR | Status: DC | PRN

## 2011-03-01 MED ORDER — LIDOCAINE HCL (PF) 1 % IJ SOLN
INTRAMUSCULAR | Status: AC
  Filled 2011-03-01: qty 30

## 2011-03-01 MED ORDER — BIVALIRUDIN 250 MG IV SOLR
INTRAVENOUS | Status: AC
  Filled 2011-03-01: qty 250

## 2011-03-01 MED ORDER — SODIUM CHLORIDE 0.9 % IV SOLN: 250.0000 mL | INTRAVENOUS | Status: DC | PRN

## 2011-03-01 MED ORDER — SODIUM CHLORIDE 0.9 % IJ SOLN: 3.0000 mL | INTRAMUSCULAR | Status: DC | PRN

## 2011-03-01 MED ORDER — NITROGLYCERIN 0.2 MG/ML ON CALL CATH LAB
INTRAVENOUS | Status: AC
  Filled 2011-03-01: qty 1

## 2011-03-01 MED ORDER — CLOPIDOGREL BISULFATE 75 MG PO TABS
75.0000 mg | ORAL_TABLET | Freq: Every day | ORAL | Status: DC
  Administered 2011-03-03: 75 mg via ORAL
  Filled 2011-03-01 (×2): qty 1

## 2011-03-01 MED ORDER — HEPARIN SOD (PORCINE) IN D5W 100 UNIT/ML IV SOLN: 800.0000 [IU]/h | INTRAVENOUS | Status: DC

## 2011-03-01 MED ORDER — CLOPIDOGREL BISULFATE 300 MG PO TABS
ORAL_TABLET | ORAL | Status: AC
  Filled 2011-03-01: qty 2

## 2011-03-01 MED ORDER — CLOPIDOGREL BISULFATE 75 MG PO TABS
75.0000 mg | ORAL_TABLET | ORAL | Status: AC
  Administered 2011-03-02: 75 mg via ORAL
  Filled 2011-03-01: qty 1

## 2011-03-01 NOTE — Progress Notes (Signed)
Subjective  Mild chest pain when getting up this am to go to bathroom.  Enzymes negative. EKG with high lateral T wave inversions.  Objective:  Vital Signs in the last 24 hours: BP 133/72  Pulse 65  Temp(Src) 98.3 F (36.8 C) (Oral)  Resp 20  Ht 5\' 6"  (1.676 m)  Wt 69.673 kg (153 lb 9.6 oz)  BMI 24.79 kg/m2  SpO2 94%  Physical Exam: Pleasant male in NAD Lungs:  Clear to A&P Cardiac:  Regular rhythm, normal S1 and S2, no S3  Intake/Output from previous day: 02/12 0701 - 02/13 0700 In: -  Out: 300 [Urine:300] Weight change:   Lab Results: Basic Metabolic Panel:  Basename 02/28/11 1719  NA 140  K 4.4  CL 108  CO2 27  GLUCOSE 91  BUN 17  CREATININE 1.03   CBC:  Basename 02/28/11 1719  WBC 5.7  NEUTROABS 3.5  HGB 10.7*  HCT 32.6*  MCV 98.5  PLT 136*   Cardiac Enzymes:  Basename 03/01/11 0531 03/01/11 0001 02/28/11 1719  CKTOTAL 54 81 68  CKMB 2.3 2.4 2.8  CKMBINDEX -- -- --  TROPONINI <0.30 <0.30 <0.30    Telemetry: Reviewed:  Sinus rhtyhm  Assessment/Plan:  1. Unstable angina  REC:  Plan cath later today to see if culprit lesion that would be amenable to stent.  [name].  MD [facility] 03/01/2011, 8:34 AM

## 2011-03-01 NOTE — Progress Notes (Signed)
Site area: right groin  Site Prior to Removal:  Level 0  Pressure Applied For 50 MINUTES    Minutes Beginning at 1820  Manual:   yes  Patient Status During Pull:  [id number]  Post Pull Groin Site:  Level 0  Post Pull Instructions Given:  yes  Post Pull Pulses Present:  yes  Dressing Applied:  yes  Comments:  Tolerated procedure well,with extended pressure hold  Time .

## 2011-03-01 NOTE — Progress Notes (Signed)
ANTICOAGULATION CONSULT NOTE - Initial Consult  Pharmacy Consult:  Heparin Indication: chest pain/ACS  No Known Allergies  Patient Measurements: Height: 5\' 6"  (167.6 cm) Weight: 153 lb 9.6 oz (69.673 kg) IBW/kg (Calculated) : 63.8  Heparin Dosing Weight: 68.5  Vital Signs: Temp: 98.3 F (36.8 C) (02/13 0500) Temp src: Oral (02/13 0500) BP: 133/72 mmHg (02/13 0500) Pulse Rate: 65  (02/13 0500)  Labs:  Basename 03/01/11 0531 03/01/11 0241 03/01/11 0001 02/28/11 1719  HGB -- -- -- 10.7*  HCT -- -- -- 32.6*  PLT -- -- -- 136*  APTT -- -- -- 31  LABPROT -- -- -- 14.9  INR -- -- -- 1.15  HEPARINUNFRC -- 0.39 -- --  CREATININE -- -- -- 1.03  CKTOTAL 54 -- 81 68  CKMB 2.3 -- 2.4 2.8  TROPONINI <0.30 -- <0.30 <0.30   Estimated Creatinine Clearance: 47.3 ml/min (by C-G formula based on Cr of 1.03).  Medical History: Past Medical History  Diagnosis Date  . Hyperlipidemia   . History of upper GI bleeding     8 units for GI bleed   . History of primary tuberculosis   . CAD (coronary artery disease)     Dr. Donnie Aho is his Cardiologist  . Blood transfusion     Had 8 units for GI bleed  . Pneumonia     h/o walking pneumonia at least 20 years ago  . GERD (gastroesophageal reflux disease)   . Hypertension   . Tuberculosis 1969    Was in sanitorium for 1 year  . Sinus drainage     11/24/10 "every morning when I get up I have to clear it out"  . Sleep disorder     "trouble staying asleep"  . Dizzy     "when I take my blood pressure medicine sometimes it makes me a little dizzy"  . Anemia   . Arthritis     Medications:  Medications Prior to Admission  Medication Dose Route Frequency Provider Last Rate Last Dose  . 0.9 %  sodium chloride infusion  250 mL Intravenous PRN W Ashley Royalty., MD      . 0.9 %  sodium chloride infusion   Intravenous Continuous W Ashley Royalty., MD 50 mL/hr at 03/01/11 [phone]    . acetaminophen (TYLENOL) tablet 650 mg  650 mg Oral Q4H PRN W  Ashley Royalty., MD      . amiodarone (PACERONE) tablet 200 mg  200 mg Oral Daily W Ashley Royalty., MD      . aspirin chewable tablet 324 mg  324 mg Oral NOW W Ashley Royalty., MD       Or  . aspirin suppository 300 mg  300 mg Rectal NOW W Ashley Royalty., MD      . aspirin EC tablet 325 mg  325 mg Oral Daily W Ashley Royalty., MD      . diazepam (VALIUM) tablet 5 mg  5 mg Oral On Call W Ashley Royalty., MD      . folic acid (FOLVITE) tablet 1 mg  1 mg Oral Daily W Ashley Royalty., MD      . heparin ADULT infusion 100 units/ml (25000 units/250 ml)  800 Units/hr Intravenous Continuous W Ashley Royalty., MD 8 mL/hr at 02/28/11 2019 800 Units/hr at 02/28/11 2019  . heparin bolus via infusion 3,000 Units  3,000 Units Intravenous Once W Ashley Royalty., MD   3,000 Units at 02/28/11 2021  .  isosorbide mononitrate (IMDUR) 24 hr tablet 60 mg  60 mg Oral Daily W Ashley Royalty., MD      . metoprolol succinate (TOPROL-XL) 24 hr tablet 50 mg  50 mg Oral Daily W Ashley Royalty., MD      . nitroGLYCERIN (NITROSTAT) SL tablet 0.4 mg  0.4 mg Sublingual Q5 min PRN W Ashley Royalty., MD      . ondansetron [facility]) injection 4 mg  4 mg Intravenous Q6H PRN W Ashley Royalty., MD      . pantoprazole (PROTONIX) EC tablet 40 mg  40 mg Oral Daily W Ashley Royalty., MD      . rosuvastatin (CRESTOR) tablet 20 mg  20 mg Oral Daily W Ashley Royalty., MD      . sodium chloride 0.9 % injection 3 mL  3 mL Intravenous Q12H W Ashley Royalty., MD      . sodium chloride 0.9 % injection 3 mL  3 mL Intravenous PRN W Ashley Royalty., MD      . DISCONTD: aspirin EC tablet 325 mg  325 mg Oral Daily W Ashley Royalty., MD       Medications Prior to Admission  Medication Sig Dispense Refill  . aspirin 325 MG EC tablet Take 325 mg by mouth daily.        Marland Kitchen atorvastatin (LIPITOR) 80 MG tablet Take 80 mg by mouth at bedtime.       . folic acid (FOLVITE) 1 MG tablet Take 1 mg by mouth  daily.        . metoprolol (TOPROL-XL) 100 MG 24 hr tablet Take 50 mg by mouth daily.          Assessment: 78 yo male with known hx CAD, CABG admitted with chest pain.  Patient has a history of GI bleed in the past.  Heparin level therapeutic at 0.39, no bleeding documented.  Goal of Therapy:  Heparin level 0.3-0.7 units/ml   Plan:  - Continue heparin gtt at 800 units/hr. - Daily heparin level and CBC - F/U plans for cath +- PCI.   Phillips Climes, PharmD 03/01/2011,7:18 AM

## 2011-03-01 NOTE — Op Note (Signed)
CARDIAC CATHETERIZATION REPORT  Jacob Hampton DOB:  06-Jun-1924 MRN:   [id number] Date:  03/01/11   PROCEDURE:  Left heart catheterization with selective coronary angiography, bypass graft angiograms  INDICATIONS:  Unstable angina pectoris  The risks, benefits, and details of the procedure were explained to the patient.  The patient verbalized understanding and wanted to proceed.  Informed written consent was obtained.  PROCEDURE TECHNIQUE:  After Xylocaine anesthesia a 2F sheath was placed in the right femoral artery with a single anterior needle wall stick.   Left coronary angiography was done using a Judkins L4 guide catheter.  Right coronary angiography was done using a Judkins R4 guide catheter. Bypass angiograms were made with the JR4 catheter. The LIMA was selected with a mammary catheter. The graft to the marginal branch was selected using an Amplatz left 1.  Left ventriculography was not performed in order to limit contrast.  CONTRAST:  90 cc.  COMPLICATIONS:  None.    HEMODYNAMICS:  Aortic pressure was 139/73.   ANGIOGRAPHIC DATA:    CORONARY ARTERIES:   Arise and distribute normally.  Right dominant. Significant coronary calcification is noted which is diffuse.  Left main coronary artery: Calcified distally with a severe 99% eccentric distal stenosis. There is filling of a diagonal or intermediate branch after the stenosis which is a small vessel.  Left anterior descending: Occluded and fills from the mammary graft.  Circumflex coronary artery: Occluded and fills via patent graft. Previously this filled antegrade.  Right coronary artery: Severe 90-95% proximal stenosis. There is diffuse segmental 50-70 stenosis in the mid vessel which is calcified.  Internal mammary graft to the LAD is widely patent. There is a mild distal anastomotic site stenosis  Left radial graft to the marginal branch has an ostial and proximal narrowing  estimated at 70-80%.  Saphenous vein graft to the right coronary artery appears occluded although was not directly visualized. There is no competitive flow into the distal vessel.  Saphenous vein graft to the marginal branch and diagonal branch are occluded and are previously occluded.  LEFT VENTRICULOGRAM: Not performed   IMPRESSIONS:  1. Severe native three-vessel coronary artery disease with severe distal left main coronary artery stenosis and occlusion of the LAD and the circumflex antegrade now 2. Patent mammary graft to the distal LAD 3. Occlusion of the saphenous vein graft to the right coronary artery with unprotected right coronary artery which is a large dominant vessel 4.  Severe coronary artery disease involving the right coronary artery proximally and with diffuse disease noted in the midportion 5. Disease noted in the previously placed radial graft to the marginal branch at the ostium  RECOMMENDATION:  Dr. Eldridge Dace will review the films to determine if he is a candidate for cardiac intervention. He does have previous GI bleeding and will place bare-metal stent in the right coronary artery. Consider intervention on the ostium of the radial graft to the marginal branch which hopefully will render him free of angina. He does have a previous history of GI bleeding.   [name] MD [facility]

## 2011-03-01 NOTE — Brief Op Note (Addendum)
02/28/2011 - 03/01/2011  1:37 PM  PATIENT:  Jacob Hampton  78 y.o. male  PRE-OPERATIVE DIAGNOSIS:  cad  POST-OPERATIVE DIAGNOSIS:  * No post-op diagnosis entered *  PROCEDURE:  Procedure(s) (LRB): LEFT HEART CATHETERIZATION WITH CORONARY/GRAFT ANGIOGRAM (N/A)  SURGEON:  Surgeon(s) and Role:    * W Ashley Royalty., MD    * Corky Crafts, MD - Primary  90% calcified proximal RCA lesion.  CB angioplasty.  3.5 x 12 bare metal stent to proximal right coronary artery.  No residual stenosis.  Residual moderate to severe diffuse disease to be medically managed.    Plan for PCI of SVG to OM tonmorrow if labs ok.  Dystany Duffy S.

## 2011-03-01 NOTE — Progress Notes (Signed)
ANTICOAGULATION CONSULT NOTE - Follow Up Consult  Pharmacy Consult for Heparin Indication: chest pain/ACS  No Known Allergies  Patient Measurements: Height: 5\' 6"  (167.6 cm) Weight: 153 lb 9.6 oz (69.673 kg) IBW/kg (Calculated) : 63.8  Heparin Dosing Weight: 68.5  Vital Signs: Temp: 98.8 F (37.1 C) (02/13 1422) Temp src: Oral (02/13 1422) BP: 121/64 mmHg (02/13 1422) Pulse Rate: 75  (02/13 1422)  Labs:  Basename 03/01/11 0531 03/01/11 0241 03/01/11 0001 02/28/11 1719  HGB -- -- -- 10.7*  HCT -- -- -- 32.6*  PLT -- -- -- 136*  APTT -- -- -- 31  LABPROT -- -- -- 14.9  INR -- -- -- 1.15  HEPARINUNFRC -- 0.39 -- --  CREATININE -- -- -- 1.03  CKTOTAL 54 -- 81 68  CKMB 2.3 -- 2.4 2.8  TROPONINI <0.30 -- <0.30 <0.30   Estimated Creatinine Clearance: 47.3 ml/min (by C-G formula based on Cr of 1.03).   Medications:  Heparin 800 units/hr - held for cath  Assessment: 87yom s/p cath to restart heparin for possible PCI tomorrow. Per Dr. Eldridge Dace, restart heparin 6hrs post sheath pull with no bolus. Per RN, sheath will be pulled  1645. Heparin level was therapeutic (0.39) on 800 units/hr this morning - will restart at same rate.  - Hg 10.7, Plts 136 - No significant bleeding reported  Goal of Therapy:  Heparin level 0.3-0.7 units/ml   Plan:  1. Restart heparin 800 units/hr (8 ml/hr) @ 2245 (6hrs post sheath pull). 2. Check AM heparin level and CBC  Cleon Dew [phone] 03/01/2011,3:12 PM

## 2011-03-02 ENCOUNTER — Encounter (HOSPITAL_COMMUNITY)
Admission: EM | Disposition: A | Discharge status: Home / Self Care | Payer: Self-pay | Source: Ambulatory Visit | Attending: Cardiology

## 2011-03-02 ENCOUNTER — Other Ambulatory Visit: Payer: Self-pay

## 2011-03-02 HISTORY — PX: PERCUTANEOUS CORONARY STENT INTERVENTION (PCI-S): SHX5485

## 2011-03-02 LAB — BASIC METABOLIC PANEL
Calcium: 8.6 mg/dL (ref 8.4–10.5)
Creatinine, Ser: 0.94 mg/dL (ref 0.50–1.35)
GFR calc Af Amer: 86 mL/min — ABNORMAL LOW (ref >90)
GFR calc non Af Amer: 74 mL/min — ABNORMAL LOW (ref >90)
Sodium: 138 mEq/L (ref 135–145)

## 2011-03-02 LAB — CBC
MCH: 30.7 pg (ref 26.0–34.0)
MCHC: 31.8 g/dL (ref 30.0–36.0)
MCV: 96.5 fL (ref 78.0–100.0)
Platelets: 130 10*3/uL — ABNORMAL LOW (ref 150–400)
RBC: 3.45 MIL/uL — ABNORMAL LOW (ref 4.22–5.81)
RDW: 15.4 % (ref 11.5–15.5)

## 2011-03-02 LAB — POCT ACTIVATED CLOTTING TIME: Activated Clotting Time: 182 seconds

## 2011-03-02 SURGERY — PERCUTANEOUS CORONARY STENT INTERVENTION (PCI-S)
Anesthesia: LOCAL

## 2011-03-02 SURGERY — PERCUTANEOUS CORONARY STENT INTERVENTION (PCI-S)
Anesthesia: Moderate Sedation

## 2011-03-02 MED ORDER — ACETAMINOPHEN 325 MG PO TABS: 650.0000 mg | ORAL_TABLET | ORAL | Status: DC | PRN

## 2011-03-02 MED ORDER — NITROGLYCERIN 0.2 MG/ML ON CALL CATH LAB
INTRAVENOUS | Status: AC
  Filled 2011-03-02: qty 1

## 2011-03-02 MED ORDER — HEPARIN (PORCINE) IN NACL 2-0.9 UNIT/ML-% IJ SOLN
INTRAMUSCULAR | Status: AC
  Filled 2011-03-02: qty 2000

## 2011-03-02 MED ORDER — FENTANYL CITRATE 0.05 MG/ML IJ SOLN
INTRAMUSCULAR | Status: AC
  Filled 2011-03-02: qty 2

## 2011-03-02 MED ORDER — ATROPINE SULFATE 1 MG/ML IJ SOLN
INTRAMUSCULAR | Status: AC
  Filled 2011-03-02: qty 1

## 2011-03-02 MED ORDER — SODIUM CHLORIDE 0.9 % IV SOLN
1.0000 mL/kg/h | INTRAVENOUS | Status: AC
  Administered 2011-03-02: 1 mL/kg/h via INTRAVENOUS

## 2011-03-02 MED ORDER — LIDOCAINE HCL (PF) 1 % IJ SOLN
INTRAMUSCULAR | Status: AC
  Filled 2011-03-02: qty 30

## 2011-03-02 MED ORDER — ONDANSETRON HCL 4 MG/2ML IJ SOLN: 4.0000 mg | Freq: Four times a day (QID) | INTRAMUSCULAR | Status: DC | PRN

## 2011-03-02 MED ORDER — BIVALIRUDIN 250 MG IV SOLR
INTRAVENOUS | Status: AC
  Filled 2011-03-02: qty 250

## 2011-03-02 MED FILL — Dextrose Inj 5%: INTRAVENOUS | Qty: 50 | Status: AC

## 2011-03-02 MED FILL — Dextrose Inj 5%: INTRAVENOUS | Qty: 50 | Status: CN

## 2011-03-02 NOTE — Progress Notes (Signed)
Site area: right groin  Site Prior to Removal:  Level 0  Pressure Applied For 50 MINUTES    Minutes Beginning at 1245  Manual:   yes  Patient Status During Pull:  AAO X3  Post Pull Groin Site:  Level 0  Post Pull Instructions Given:  yes  Post Pull Pulses Present:  yes  Dressing Applied:  yes  Comments:  TOLERATED PROCEDURE WELL

## 2011-03-02 NOTE — ED Provider Notes (Signed)
Medical screening examination/treatment/procedure(s) were performed by non-physician practitioner and as supervising physician I was immediately available for consultation/collaboration.  Coree Brame P Trinita Devlin, MD 03/02/11 1532 

## 2011-03-02 NOTE — Progress Notes (Signed)
Subjective:  No chest pain, tolerated stent to RCA well yesterday.  Has ostial disease of the graft to the OM.    Objective:  Vital Signs in the last 24 hours: BP 127/66  Pulse 65  Temp(Src) 98.3 F (36.8 C) (Oral)  Resp 14  Ht 5\' 6"  (1.676 m)  Wt 70 kg (154 lb 5.2 oz)  BMI 24.91 kg/m2  SpO2 95%  Physical Exam: Pleasant WM in NAD Lungs:  Clear  Cardiac:  Regular rhythm, normal S1 and S2, no S3 Extremities:  Cath site clean and dry  Intake/Output from previous day: 02/13 0701 - 02/14 0700 In: 306 [I.V.:306] Out: 1250 [Urine:1250] Weight change: 0.327 kg (11.6 oz)  Lab Results: Basic Metabolic Panel:  Basename 03/02/11 0508 02/28/11 1719  NA 138 140  K 4.0 4.4  CL 106 108  CO2 24 27  GLUCOSE 87 91  BUN 14 17  CREATININE 0.94 1.03   CBC:  Basename 03/02/11 0508 02/28/11 1719  WBC 5.6 5.7  NEUTROABS -- 3.5  HGB 10.6* 10.7*  HCT 33.3* 32.6*  MCV 96.5 98.5  PLT 130* 136*   Cardiac Enzymes:  Basename 03/01/11 0531 03/01/11 0001 02/28/11 1719  CKTOTAL 54 81 68  CKMB 2.3 2.4 2.8  CKMBINDEX -- -- --  TROPONINI <0.30 <0.30 <0.30   Telemetry: Sinus rhythm  Assessment/Plan:  1. UAP 2. 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 Janyce Llanos.  MD [facility] 03/02/2011, 9:02 AM

## 2011-03-02 NOTE — Op Note (Signed)
PROCEDURE:  PCI of right coronary artery.  INDICATIONS:  Unstable angina  The risks, benefits, and details of the procedure were explained to the patient.  The patient verbalized understanding and wanted to proceed.  Informed written consent was obtained.  PROCEDURE TECHNIQUE:  Diagnostic cath was performed by Dr. Donnie Aho.  This revealed a 95% lesion in the proximal right coronary artery.  There was moderate to severe diffuse disease in the mid right coronary artery.  Due to the fact that the patient has had a history of GI bleed, we elected to intervene upon the tightest portion of the right coronary artery and hopefully insert just a short bare-metal stent.  This way, he would not require prolonged dual antiplatelet therapy.  A 74F sheath was placed in the right femoral artery.  Angiomax was used for anticoagulation.  An ACT was used to confirm that the Angiomax is therapeutic. Right coronary angiography was done using a Judkins R4 guide catheter.  A prowater wire was placed across the disease in the right coronary artery.  A 2.5 x 10 cutting balloon was advanced to the proximal right  coronary artery.  the balloon would not pass across the stenosis.  A 2.0 x 6 cutting balloon was then advanced across the lesion and inflated several times to 10 atmospheres.  A 3.0 x 12 balloon was then advanced to the proximal right coronary artery.  This was inflated and appeared to dilate the artery well.  A 3.5 x 12 integrity stent was then deployed across the lesion.  The stent was postdilated with 3.75 x 8 noncompliant balloon.  There is no residual stenosis.  Lesion length was 9 mm.  Intracoronary nitroglycerin was given to treat spasm in the distal vessel.  The moderate to severe mid vessel disease remained unchanged.     CONTRAST:  Total of  40  cc.  COMPLICATIONS:  None.      IMPRESSIONS:  1.  Successful bare metal stent placement to the proximal right coronary artery with a 3.5 x 12 integrity stent.   Residual disease in the right coronary artery to be managed medically.  We wanted to avoid the need for prolonged dual antiplatelet therapy in this patient who has had GI bleeding in the past.  RECOMMENDATION:   Dual antiplatelet therapy with aspirin and Plavix for one month.  We'll plan for PCI to the radial graft to OM tomorrow, assuming his renal function remains stable.

## 2011-03-02 NOTE — Op Note (Signed)
PROCEDURE:  PCI of radial graft to OM INDICATIONS:    The risks, benefits, and details of the procedure were explained to the patient.  The patient verbalized understanding and wanted to proceed.  Informed written consent was obtained.  PROCEDURE TECHNIQUE:  After Xylocaine anesthesia a 29F sheath was placed in the right femoral artery with a single anterior needle wall stick.  A CLS 3.5 guide catheter was initially tried but would not engage the radial graft to the OM.  A JR 4 guide catheter was successfully used to engage the graft.  Angiomax was used for anticoagulation.  ACT was used to confirm that the Angiomax is therapeutic.  Pro water wire was placed across the area disease.  A 3.5 x 12 bare-metal integrity stent was advanced across the diseased area and the lesion was direct stented.  Stent was deployed at 14 atmospheres.  The stent was postdilated with a 3.75 x 9 [place] sprinter balloon inflated to 18 atmospheres in the distal portion the stent and 20 atmospheres in the proximal portion of the stent an attempt to flare the ostium.  There is no residual stenosis.  The patient tolerated the procedure well.   CONTRAST:  Total of 90 cc.  COMPLICATIONS:  None.      ANGIOGRAPHIC DATA:   The radial graft to OM had an ostial 80% stenosis with pressure damping.  IMPRESSIONS:  1. Successful bare-metal stent (3.5 x 12 integrity) placement to the ostium of the radial graft to OM.  The stent was post dilated to greater than 4 mm in diameter.  There is no residual stenosis.  Lesion length is 10 mm.  RECOMMENDATION:  The patient will require dual antiplatelet therapy for at least one month.  Bare-metal stent was used due to the patient's history of prior GI bleed.  We wanted to avoid the need for prolonged Plavix therapy.  He'll be watched overnight.  The patient will followup with Dr. Donnie Aho.

## 2011-03-03 ENCOUNTER — Other Ambulatory Visit: Payer: Self-pay

## 2011-03-03 LAB — BASIC METABOLIC PANEL
BUN: 12 mg/dL (ref 6–23)
CO2: 28 mEq/L (ref 19–32)
Calcium: 9 mg/dL (ref 8.4–10.5)
Chloride: 104 mEq/L (ref 96–112)
Creatinine, Ser: 1.02 mg/dL (ref 0.50–1.35)

## 2011-03-03 LAB — CBC
HCT: 36 % — ABNORMAL LOW (ref 39.0–52.0)
MCH: 31.6 pg (ref 26.0–34.0)
MCV: 96.5 fL (ref 78.0–100.0)
RBC: 3.73 MIL/uL — ABNORMAL LOW (ref 4.22–5.81)
RDW: 15.3 % (ref 11.5–15.5)
WBC: 5 10*3/uL (ref 4.0–10.5)

## 2011-03-03 MED ORDER — CLOPIDOGREL BISULFATE 75 MG PO TABS: 75.0000 mg | ORAL_TABLET | Freq: Every day | ORAL | Status: DC

## 2011-03-03 MED ORDER — ASPIRIN 81 MG PO TABS: 81.0000 mg | ORAL_TABLET | Freq: Every day | ORAL | Status: DC

## 2011-03-03 MED FILL — Dextrose Inj 5%: INTRAVENOUS | Qty: 50 | Status: AC

## 2011-03-03 NOTE — Progress Notes (Signed)
CARDIAC REHAB PHASE I   PRE:  Rate/Rhythm: 70 SR    BP: sitting 140/70    SaO2:   MODE:  Ambulation: 340 ft   POST:  Rate/Rhythm: 85    BP: sitting 134/68     SaO2:   Tolerated well. Reviewed ed. Sts he will ex on his own due to cost of CRPII (Medicare only covers 80%). Very compliant. [phone]  Harriet Masson CES, ACSM

## 2011-03-03 NOTE — Discharge Summary (Signed)
Physician Discharge Summary  Patient ID: Jacob Hampton MRN: [id number] DOB/AGE: [id number] 76 y.o.  Admit date: 02/28/2011 Discharge date: 03/03/2011  Primary Discharge Diagnosis: 1. Unstable angina pectoris  Secondary Discharge Diagnosis: 2. 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 Severe native vessel coronary artery disease 4. Hyperlipidemia under treatment 5. Previous history of GI bleeding requiring transfusions 6. Previous history of atrial fibrillation following surgery  Procedures::  Cardiac catheterization on 03/01/11 with bare-metal stenting of the proximal right coronary artery PCI with bare-metal stenting of the ostium and proximal portion of the radial graft to the marginal branch by Jacob Hampton Course: This 78 year old male underwent coronary bypass grafting in November for unstable angina pectoralis and occlusion of previous vein grafts. Had some postoperative atrial fibrillation. He had been doing relatively well until a couple of weeks prior to admission he developed recurrent angina. This became progressive and he was admitted to the hospital with crescendo angina suggestive of unstable angina.  The patient was seen initially in the emergency room. Initial cardiac enzymes were normal. He was placed in intravenous heparin. He was taken to the cardiac catheterization laboratory the next day and had findings of an occluded vein graft to a previous right coronary artery and an ostial and proximal stenosis of the radial graft to the marginal branch. JacobVaranasi placed a bare-metal stents in the proximal portion of the right coronary artery leaving a moderate stenosis in the mid and distal portions. Bare-metal stenting was chosen because the previous history of significant GI bleeding requiring transfusions. Because of the amount of contrast he was taken back  the next day and had bare-metal stenting of the proximal portion of the radial graft to the marginal branch. He tolerated this well and had no recurrence of angina. He was ambulatory in the hall without recurrent symptoms.  He was discharged in improved condition. He is to discontinue his Imdur but should continue pantoprazole. He will continue aspirin and Plavix for the time being but does have a prior history of GI bleeding.  Discharge Exam: Blood pressure 129/63, pulse 59, temperature 97.6 F (36.4 C), temperature source Oral, resp. rate 18, height 5\' 6"  (1.676 m), weight 67.5 kg (148 lb 13 oz), SpO2 97.00%.   He has a stable catheterization site with no bruit and no significant hematoma.  Labs: CBC:   Lab Results  Component Value Date   WBC 5.6 03/02/2011   HGB 10.6* 03/02/2011   HCT 33.3* 03/02/2011   MCV 96.5 03/02/2011   PLT 130* 03/02/2011   CMP:  Lab 03/02/11 0508 02/28/11 1719  NA 138 --  K 4.0 --  CL 106 --  CO2 24 --  BUN 14 --  CREATININE 0.94 --  CALCIUM 8.6 --  PROT -- 6.1  BILITOT -- 1.1  ALKPHOS -- 67  ALT -- 16  AST -- 22  GLUCOSE 87 --   Lipid Panel     Component Value Date/Time   CHOL 117 03/01/2011 0531   TRIG 68 03/01/2011 0531   HDL 42 03/01/2011 0531   CHOLHDL 2.8 03/01/2011 0531   VLDL 14 03/01/2011 0531   LDLCALC 61 03/01/2011 0531   Cardiac Enzymes:  Basename 03/01/11 0531 03/01/11 0001 02/28/11 1719  CKTOTAL 54 81 68  CKMB 2.3 2.4 2.8  CKMBINDEX -- -- --  TROPONINI <0.30 <0.30 <0.30    Radiology: There is chronic  blunting of the left costophrenic angle and mild cardiomegaly.  EKG: Sinus rhythm with T wave inversions in the high lateral leads.  Discharge Medications:  Jacob Hampton, Jacob Hampton  Home Medication Instructions ZOX:[id number]   Printed on:03/03/11 0827  Medication Information                    folic acid (FOLVITE) 1 MG tablet Take 1 mg by mouth daily.             atorvastatin (LIPITOR) 80 MG tablet Take 80 mg by mouth at bedtime.              metoprolol (TOPROL-XL) 100 MG 24 hr tablet Take 50 mg by mouth daily.             pantoprazole (PROTONIX) 40 MG tablet Take 40 mg by mouth daily.           nitroGLYCERIN (NITROSTAT) 0.4 MG SL tablet Place 0.4 mg under the tongue every 5 (five) minutes as needed.           amiodarone (PACERONE) 200 MG tablet Take 200 mg by mouth daily.           clopidogrel (PLAVIX) 75 MG tablet Take 1 tablet (75 mg total) by mouth daily with breakfast.           aspirin 81 MG tablet Take 1 tablet (81 mg total) by mouth daily.             Followup plans and appointments: Follow up Jacob Hampton in one week. His report if he has recurrent chest discomfort. He is to resume normal walking. He previously had referred him to cardiac rehabilitation but he was unable to participate because of the expense of it.  Signed: Darden Hampton. MD [facility] 03/03/2011, 8:27 AM

## 2011-03-13 ENCOUNTER — Ambulatory Visit: Payer: Medicare Other | Admitting: Thoracic Surgery (Cardiothoracic Vascular Surgery)

## 2011-04-03 ENCOUNTER — Ambulatory Visit (INDEPENDENT_AMBULATORY_CARE_PROVIDER_SITE_OTHER): Payer: Medicare Other | Admitting: Thoracic Surgery (Cardiothoracic Vascular Surgery)

## 2011-04-03 ENCOUNTER — Encounter: Payer: Self-pay | Admitting: Thoracic Surgery (Cardiothoracic Vascular Surgery)

## 2011-04-03 VITALS — BP 140/79 | HR 60 | Resp 16 | Ht 65.0 in | Wt 152.0 lb

## 2011-04-03 DIAGNOSIS — Z09 Encounter for follow-up examination after completed treatment for conditions other than malignant neoplasm: Secondary | ICD-10-CM

## 2011-04-03 DIAGNOSIS — I2510 Atherosclerotic heart disease of native coronary artery without angina pectoris: Secondary | ICD-10-CM

## 2011-04-03 NOTE — Progress Notes (Signed)
                   [address]            [place] [address]          [phone]     CARDIOTHORACIC SURGERY OFFICE NOTE  Referring Provider is Othella Boyer, MD PCP is Provider Not In System   HPI:  Patient returns for followup status post redo coronary artery bypass grafting 11/22/2010. He initially did well, but unfortunately patient developed premature vein graft failure last month requiring PCI and stenting of his native right coronary artery and a short segment vein graft proximal portion of the left radial artery graft. Since then he has done quite well and he returns to the office for further followup today. 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 His activity level is quite good. He has no chest pain or shortness of breath. He remains on aspirin and Plavix and he has not had any bleeding complications. He particularly denies any history of hematochezia or melena.   Current Outpatient Prescriptions  Medication Sig Dispense Refill  . aspirin 81 MG tablet Take 1 tablet (81 mg total) by mouth daily.  30 tablet  12  . atorvastatin (LIPITOR) 80 MG tablet Take 80 mg by mouth at bedtime.       . clopidogrel (PLAVIX) 75 MG tablet Take 1 tablet (75 mg total) by mouth daily with breakfast.  30 tablet  12  . folic acid (FOLVITE) 1 MG tablet Take 1 mg by mouth daily.        . metoprolol (TOPROL-XL) 100 MG 24 hr tablet Take 50 mg by mouth daily.        . nitroGLYCERIN (NITROSTAT) 0.4 MG SL tablet Place 0.4 mg under the tongue every 5 (five) minutes as needed.      . pantoprazole (PROTONIX) 40 MG tablet Take 40 mg by mouth daily.      Marland Kitchen amiodarone (PACERONE) 200 MG tablet Take 200 mg by mouth daily.          Physical Exam:   BP 140/79  Pulse 60  Resp 16  Ht 5\' 5"  (1.651 m)  Wt 152 lb (68.947 kg)  BMI 25.29 kg/m2  SpO2 96%  General:  Well-appearing  Chest:   Clear to auscultation with symmetrical breath  sounds  CV:   Regular rate and rhythm without murmur  Incisions:  Completely healed with stable sternum  Abdomen:  Soft and nontender  Extremities:  Warm and well-perfused  Diagnostic Tests:  n/a   Impression:  The patient is doing very well at present status post redo coronary artery bypass grafting last November. Unfortunately, he did have premature vein graft failure that required PCI and stenting of the native right coronary artery for failure the vein graft to right coronary system as well as PCI and stenting of the short segment proximal saphenous vein component of the left radial artery graft to the circumflex system. He was noted to have poor quality saphenous vein conduit at the time of the surgery. He is currently doing well and tolerating antiplatelet therapy without complication so far.  Plan:  The future the patient will call and return to see [place] as needed. He will continue to followup with Dr. Donnie Aho. All of his questions have been answered.   Salvatore Decent. Cornelius Moras, MD 04/03/2011 1:22 PM

## 2011-05-18 ENCOUNTER — Encounter (HOSPITAL_COMMUNITY): Payer: Self-pay | Admitting: Emergency Medicine

## 2011-05-18 ENCOUNTER — Inpatient Hospital Stay (HOSPITAL_COMMUNITY)
Admission: EM | Admit: 2011-05-18 | Discharge: 2011-05-20 | DRG: 247 | Disposition: A | Discharge status: Home / Self Care | Payer: Medicare Other | Attending: Cardiology | Admitting: Cardiology

## 2011-05-18 ENCOUNTER — Emergency Department (HOSPITAL_COMMUNITY): Payer: Medicare Other

## 2011-05-18 DIAGNOSIS — I10 Essential (primary) hypertension: Secondary | ICD-10-CM | POA: Diagnosis present

## 2011-05-18 DIAGNOSIS — Y92009 Unspecified place in unspecified non-institutional (private) residence as the place of occurrence of the external cause: Secondary | ICD-10-CM

## 2011-05-18 DIAGNOSIS — Z87891 Personal history of nicotine dependence: Secondary | ICD-10-CM

## 2011-05-18 DIAGNOSIS — Z8711 Personal history of peptic ulcer disease: Secondary | ICD-10-CM

## 2011-05-18 DIAGNOSIS — I200 Unstable angina: Secondary | ICD-10-CM

## 2011-05-18 DIAGNOSIS — I739 Peripheral vascular disease, unspecified: Secondary | ICD-10-CM | POA: Diagnosis present

## 2011-05-18 DIAGNOSIS — Z7902 Long term (current) use of antithrombotics/antiplatelets: Secondary | ICD-10-CM

## 2011-05-18 DIAGNOSIS — R0602 Shortness of breath: Secondary | ICD-10-CM | POA: Insufficient documentation

## 2011-05-18 DIAGNOSIS — Z8611 Personal history of tuberculosis: Secondary | ICD-10-CM

## 2011-05-18 DIAGNOSIS — I2582 Chronic total occlusion of coronary artery: Secondary | ICD-10-CM | POA: Diagnosis present

## 2011-05-18 DIAGNOSIS — K219 Gastro-esophageal reflux disease without esophagitis: Secondary | ICD-10-CM | POA: Diagnosis present

## 2011-05-18 DIAGNOSIS — T82897A Other specified complication of cardiac prosthetic devices, implants and grafts, initial encounter: Principal | ICD-10-CM | POA: Diagnosis present

## 2011-05-18 DIAGNOSIS — Z79899 Other long term (current) drug therapy: Secondary | ICD-10-CM

## 2011-05-18 DIAGNOSIS — E785 Hyperlipidemia, unspecified: Secondary | ICD-10-CM | POA: Diagnosis present

## 2011-05-18 DIAGNOSIS — I2510 Atherosclerotic heart disease of native coronary artery without angina pectoris: Secondary | ICD-10-CM | POA: Diagnosis present

## 2011-05-18 DIAGNOSIS — Z7982 Long term (current) use of aspirin: Secondary | ICD-10-CM

## 2011-05-18 DIAGNOSIS — Y840 Cardiac catheterization as the cause of abnormal reaction of the patient, or of later complication, without mention of misadventure at the time of the procedure: Secondary | ICD-10-CM | POA: Diagnosis present

## 2011-05-18 DIAGNOSIS — I25810 Atherosclerosis of coronary artery bypass graft(s) without angina pectoris: Secondary | ICD-10-CM | POA: Diagnosis present

## 2011-05-18 HISTORY — DX: Cardiac murmur, unspecified: R01.1

## 2011-05-18 HISTORY — DX: Calculus of kidney: N20.0

## 2011-05-18 HISTORY — DX: Shortness of breath: R06.02

## 2011-05-18 LAB — COMPREHENSIVE METABOLIC PANEL
Albumin: 3.3 g/dL — ABNORMAL LOW (ref 3.5–5.2)
Albumin: 3.4 g/dL — ABNORMAL LOW (ref 3.5–5.2)
Alkaline Phosphatase: 72 U/L (ref 39–117)
Alkaline Phosphatase: 78 U/L (ref 39–117)
BUN: 20 mg/dL (ref 6–23)
BUN: 19 mg/dL (ref 6–23)
CO2: 28 mEq/L (ref 19–32)
Chloride: 106 mEq/L (ref 96–112)
Chloride: 106 mEq/L (ref 96–112)
Creatinine, Ser: 0.98 mg/dL (ref 0.50–1.35)
Creatinine, Ser: 1.02 mg/dL (ref 0.50–1.35)
GFR calc Af Amer: 84 mL/min — ABNORMAL LOW (ref >90)
GFR calc non Af Amer: 65 mL/min — ABNORMAL LOW (ref >90)
Glucose, Bld: 82 mg/dL (ref 70–99)
Potassium: 4.2 mEq/L (ref 3.5–5.1)
Potassium: 4.4 mEq/L (ref 3.5–5.1)
Total Bilirubin: 0.8 mg/dL (ref 0.3–1.2)
Total Bilirubin: 0.9 mg/dL (ref 0.3–1.2)

## 2011-05-18 LAB — URINALYSIS, ROUTINE W REFLEX MICROSCOPIC
Bilirubin Urine: NEGATIVE
Glucose, UA: NEGATIVE mg/dL
Hgb urine dipstick: NEGATIVE
Ketones, ur: NEGATIVE mg/dL
Protein, ur: NEGATIVE mg/dL
Urobilinogen, UA: 1 mg/dL (ref 0.0–1.0)

## 2011-05-18 LAB — PROTIME-INR
INR: 1.15 (ref 0.00–1.49)
Prothrombin Time: 14.9 seconds (ref 11.6–15.2)

## 2011-05-18 LAB — DIFFERENTIAL
Basophils Absolute: 0 10*3/uL (ref 0.0–0.1)
Eosinophils Relative: 2 % (ref 0–5)
Lymphocytes Relative: 27 % (ref 12–46)
Lymphs Abs: 1.8 10*3/uL (ref 0.7–4.0)
Monocytes Absolute: 0.4 10*3/uL (ref 0.1–1.0)
Monocytes Relative: 6 % (ref 3–12)
Neutro Abs: 4.2 10*3/uL (ref 1.7–7.7)

## 2011-05-18 LAB — CBC
HCT: 39.8 % (ref 39.0–52.0)
HCT: 39 % (ref 39.0–52.0)
Hemoglobin: 13.2 g/dL (ref 13.0–17.0)
Hemoglobin: 12.8 g/dL — ABNORMAL LOW (ref 13.0–17.0)
MCHC: 33.2 g/dL (ref 30.0–36.0)
MCV: 96.1 fL (ref 78.0–100.0)
MCV: 94.9 fL (ref 78.0–100.0)
RBC: 4.11 MIL/uL — ABNORMAL LOW (ref 4.22–5.81)
RDW: 14.1 % (ref 11.5–15.5)
WBC: 6.6 10*3/uL (ref 4.0–10.5)

## 2011-05-18 LAB — POCT I-STAT TROPONIN I: Troponin i, poc: 0 ng/mL (ref 0.00–0.08)

## 2011-05-18 LAB — GLUCOSE, CAPILLARY: Glucose-Capillary: 76 mg/dL (ref 70–99)

## 2011-05-18 LAB — CARDIAC PANEL(CRET KIN+CKTOT+MB+TROPI)
Relative Index: INVALID (ref 0.0–2.5)
Relative Index: INVALID (ref 0.0–2.5)
Total CK: 97 U/L (ref 7–232)
Total CK: 92 U/L (ref 7–232)
Troponin I: <0.3 ng/mL (ref <0.30)

## 2011-05-18 LAB — URINE MICROSCOPIC-ADD ON

## 2011-05-18 LAB — APTT: aPTT: 30 seconds (ref 24–37)

## 2011-05-18 LAB — HEPARIN LEVEL (UNFRACTIONATED): Heparin Unfractionated: 0.64 IU/mL (ref 0.30–0.70)

## 2011-05-18 MED ORDER — NITROGLYCERIN 0.4 MG SL SUBL: 0.4000 mg | SUBLINGUAL_TABLET | SUBLINGUAL | Status: DC | PRN

## 2011-05-18 MED ORDER — ATORVASTATIN CALCIUM 80 MG PO TABS
80.0000 mg | ORAL_TABLET | Freq: Every day | ORAL | Status: DC
  Administered 2011-05-18 – 2011-05-19 (×2): 80 mg via ORAL
  Filled 2011-05-18 (×4): qty 1

## 2011-05-18 MED ORDER — ASPIRIN EC 81 MG PO TBEC
81.0000 mg | DELAYED_RELEASE_TABLET | Freq: Every day | ORAL | Status: DC
  Administered 2011-05-18: 81 mg via ORAL
  Filled 2011-05-18 (×3): qty 1

## 2011-05-18 MED ORDER — ASPIRIN 81 MG PO TABS: 81.0000 mg | ORAL_TABLET | Freq: Every day | ORAL | Status: DC

## 2011-05-18 MED ORDER — CLOPIDOGREL BISULFATE 75 MG PO TABS
75.0000 mg | ORAL_TABLET | Freq: Every day | ORAL | Status: DC
  Filled 2011-05-18 (×2): qty 1

## 2011-05-18 MED ORDER — ASPIRIN 81 MG PO CHEW
324.0000 mg | CHEWABLE_TABLET | Freq: Once | ORAL | Status: AC
  Administered 2011-05-18: 324 mg via ORAL
  Filled 2011-05-18: qty 4

## 2011-05-18 MED ORDER — SODIUM CHLORIDE 0.9 % IV SOLN
INTRAVENOUS | Status: DC
  Administered 2011-05-18: 22:00:00 via INTRAVENOUS

## 2011-05-18 MED ORDER — CLOPIDOGREL BISULFATE 75 MG PO TABS: 75.0000 mg | ORAL_TABLET | Freq: Every day | ORAL | Status: DC

## 2011-05-18 MED ORDER — FOLIC ACID 1 MG PO TABS
1.0000 mg | ORAL_TABLET | Freq: Every day | ORAL | Status: DC
Start: 2011-05-18 — End: 2011-05-20
  Administered 2011-05-18 – 2011-05-19 (×2): 1 mg via ORAL
  Filled 2011-05-18 (×4): qty 1

## 2011-05-18 MED ORDER — METOPROLOL SUCCINATE ER 50 MG PO TB24
50.0000 mg | ORAL_TABLET | Freq: Every day | ORAL | Status: DC
Start: 2011-05-18 — End: 2011-05-20
  Administered 2011-05-18 – 2011-05-19 (×2): 50 mg via ORAL
  Filled 2011-05-18 (×4): qty 1

## 2011-05-18 MED ORDER — SODIUM CHLORIDE 0.9 % IV SOLN
1000.0000 mL | INTRAVENOUS | Status: DC
  Administered 2011-05-18: 1000 mL via INTRAVENOUS

## 2011-05-18 MED ORDER — HEPARIN (PORCINE) IN NACL 100-0.45 UNIT/ML-% IJ SOLN
950.0000 [IU]/h | INTRAMUSCULAR | Status: DC
  Administered 2011-05-18: 950 [IU]/h via INTRAVENOUS
  Administered 2011-05-18: 1000 [IU]/h via INTRAVENOUS
  Filled 2011-05-18 (×2): qty 250

## 2011-05-18 MED ORDER — DIAZEPAM 5 MG PO TABS
5.0000 mg | ORAL_TABLET | ORAL | Status: AC
  Administered 2011-05-19: 5 mg via ORAL
  Filled 2011-05-18: qty 1

## 2011-05-18 MED ORDER — HEPARIN BOLUS VIA INFUSION
3000.0000 [IU] | Freq: Once | INTRAVENOUS | Status: AC
  Administered 2011-05-18: 3000 [IU] via INTRAVENOUS

## 2011-05-18 MED ORDER — ACETAMINOPHEN 325 MG PO TABS: 650.0000 mg | ORAL_TABLET | ORAL | Status: DC | PRN

## 2011-05-18 MED ORDER — ONDANSETRON HCL 4 MG/2ML IJ SOLN: 4.0000 mg | Freq: Four times a day (QID) | INTRAMUSCULAR | Status: DC | PRN

## 2011-05-18 NOTE — ED Notes (Signed)
Pt c/o intermittent left sided CP x 1 week that was worse last night; pt sts took 3 SL nitro; pt with hx of CABG; pt sts SOB with pain but denies nausea

## 2011-05-18 NOTE — Progress Notes (Signed)
Patient stable tonight.  No more pain.  For cath in am.  Cardiac catheterization was discussed with the patient fully including risks of myocardial infarction, death, stroke, bleeding, arrhythmia, dye allergy, renal insufficiency or bleeding.  The patient understands and is willing to proceed. Plan 7:30 in am.  Dr. Eldridge Dace to assist with PCI if needed.  [name] MD [facility]

## 2011-05-18 NOTE — Progress Notes (Signed)
ANTICOAGULATION CONSULT NOTE  Heparin level 0.64 on 1000 units/hr Goal heparin level = 0.3-0.7  Heparin level is within desired range but at the top. Will decrease the rate just a little to 950 units/hr to ensure that the heparin level stays in the therapeutic range.  Cardell Peach, Pharm D

## 2011-05-18 NOTE — ED Provider Notes (Signed)
History     CSN: [id number]  Arrival date & time 05/18/11  2440   First MD Initiated Contact with Patient 05/18/11 [phone]      Chief Complaint  Patient presents with  . Chest Pain    (Consider location/radiation/quality/duration/timing/severity/associated sxs/prior treatment) HPI Comments: The patient is an 78 year old man who has had intermittent chest pain for a week. Last night he had pain in the center of his chest, and had to take 2 nitroglycerin tablets. This morning he had to take 1 nitroglycerin tablet. He has had relief of his pain and is currently pain free. Reviewed his prior history shows that he's had 2 prior coronary artery bypass grafting procedures. He had graft failure of one of his grafts in February of 2013, requiring angioplasty and stenting them.  Patient is a 78 y.o. male presenting with chest pain.  Chest Pain Episode onset: He has had chest pain intermittently over the past week, worse in last night and this morning. Episode Length: Psoas last 20 or 30 minutes, and are relieved by nitroglycerin. Chest pain occurs intermittently. The chest pain is improving. Associated with: Nothing. At its most intense, the pain is at 7/10. The pain is currently at 0/10. The severity of the pain is moderate. The quality of the pain is described as pressure-like and similar to previous episodes. The pain does not radiate. Exacerbated by: Nothing. Pertinent negatives for primary symptoms include no fever, no syncope, no shortness of breath, no cough, no nausea, no vomiting and no dizziness.  Pertinent negatives for associated symptoms include no diaphoresis. He tried nitroglycerin for the symptoms. Risk factors include male gender, lack of exercise and sedentary lifestyle (Known coronary artery disease).  His past medical history is significant for CAD.  Procedure history is positive for cardiac catheterization.     Past Medical History  Diagnosis Date  . Hyperlipidemia   . History of  upper GI bleeding     8 units for GI bleed   . History of primary tuberculosis   . CAD (coronary artery disease)     Dr. Donnie Aho is his Cardiologist  . Blood transfusion     Had 8 units for GI bleed  . Pneumonia     h/o walking pneumonia at least 20 years ago  . GERD (gastroesophageal reflux disease)   . Hypertension   . Tuberculosis 1969    Was in sanitorium for 1 year  . Sinus drainage     11/24/10 "every morning when I get up I have to clear it out"  . Sleep disorder     "trouble staying asleep"  . Dizzy     "when I take my blood pressure medicine sometimes it makes me a little dizzy"  . Anemia   . Arthritis     Past Surgical History  Procedure Date  . Partial gastrectomy   . Hernia repair   . Right testicle surgery   . Other surgical history 03/25/1999    reexploration for bleeding s/p CABG - coagulopathy without mechanical source  . Redo cabg 11/22/2010    CABG x2 with left radial to OM and SVG to RCA  . Coronary artery bypass graft 03/25/1999    CABG x5 using LIMA to LAD, SVG to Diag, SVG to OM, sequential SVG to PD and RPL, open SV harvest from left thigh and leg  . Cholecystectomy     "w/bleeding ulcer OR"  . Bleeding ulcer     w/gallbladder  . Appendectomy   .  Cataract extraction w/ intraocular lens implant     bilaterally  . Radial artery harvest 11/22/2010    Procedure: RADIAL ARTERY HARVEST;  Surgeon: Purcell Nails, MD;  Location: [facility] OR;  Service: Open Heart Surgery;  Laterality: Left;  . Coronary artery bypass graft 11/22/2010    Procedure: REDO CORONARY ARTERY BYPASS GRAFTING (CABG)TIMES TWO;  Surgeon: Purcell Nails, MD;  Location: [facility] OR;  Service: Open Heart Surgery;  Laterality: N/A;  using endoscopically harvested right saphenous vein and left radial artery; Transesophageal echocardiogram    Family History  Problem Relation Age of Onset  . Cancer Father   . Cancer Brother   . Stroke Brother     History  Substance Use Topics  . Smoking status:  Former Smoker -- 0.2 packs/day for 10 years    Types: Cigarettes    Quit date: 11/20/1960  . Smokeless tobacco: Never Used  . Alcohol Use: No      Review of Systems  Constitutional: Negative.  Negative for fever and diaphoresis.  HENT: Negative.   Eyes: Negative.   Respiratory: Negative for cough and shortness of breath.   Cardiovascular: Positive for chest pain. Negative for syncope.  Gastrointestinal: Negative for nausea and vomiting.  Genitourinary: Negative.   Musculoskeletal: Negative.   Skin: Negative.   Neurological: Negative.  Negative for dizziness.  Psychiatric/Behavioral: Negative.     Allergies  Review of patient's allergies indicates no known allergies.  Home Medications   Current Outpatient Rx  Name Route Sig Dispense Refill  . AMIODARONE HCL 200 MG PO TABS Oral Take 200 mg by mouth daily.    . ASPIRIN 81 MG PO TABS Oral Take 81 mg by mouth daily.    . ATORVASTATIN CALCIUM 80 MG PO TABS Oral Take 80 mg by mouth at bedtime.     . CLOPIDOGREL BISULFATE 75 MG PO TABS Oral Take 75 mg by mouth daily with breakfast.    . FOLIC ACID 1 MG PO TABS Oral Take 1 mg by mouth daily.      Marland Kitchen METOPROLOL SUCCINATE ER 100 MG PO TB24 Oral Take 50 mg by mouth daily.      Marland Kitchen NITROGLYCERIN 0.4 MG SL SUBL Sublingual Place 0.4 mg under the tongue every 5 (five) minutes as needed. For chest pain      BP 145/71  Pulse 64  Temp(Src) 98.8 F (37.1 C) (Oral)  Resp 20  SpO2 97%  Physical Exam  Nursing note and vitals reviewed. Constitutional: He is oriented to person, place, and time. He appears well-developed and well-nourished. No distress.  HENT:  Head: Normocephalic and atraumatic.  Right Ear: External ear normal.  Left Ear: External ear normal.       Edentulous.  Eyes: Conjunctivae and EOM are normal. Pupils are equal, round, and reactive to light. No scleral icterus.  Neck: Normal range of motion. Neck supple.  Cardiovascular: Normal rate, regular rhythm and normal heart  sounds.   Pulmonary/Chest: Effort normal and breath sounds normal.  Abdominal: Soft. Bowel sounds are normal.  Musculoskeletal: Normal range of motion. He exhibits no edema and no tenderness.  Neurological: He is alert and oriented to person, place, and time.       No sensory or motor deficit  Skin: Skin is warm.  Psychiatric: He has a normal mood and affect. His behavior is normal.    ED Course  Procedures (including critical care time)   9:11 AM  Date: 05/18/2011  Rate: 63  Rhythm: normal sinus  rhythm and sinus arrhythmia  QRS Axis: normal  Intervals: normal  ST/T Wave abnormalities: normal  Conduction Disutrbances:none  Narrative Interpretation: Normal EKG.  Old EKG Reviewed: unchanged  9:48 AM Pt was seen and had physical examination. EKG was benign. Laboratory tests were ordered. Aspirin was ordered. An he is not in pain, so no nitroglycerin was ordered. I contacted Barnetta Hammersmith M.D., his cardiologist, who will be in to see him.  1. Chest pain   2. Unstable angina             Carleene Cooper III, MD 05/18/11 [phone]

## 2011-05-18 NOTE — ED Notes (Signed)
Patient en route to floor.

## 2011-05-18 NOTE — ED Provider Notes (Deleted)
9:11 AM  Date: 05/18/2011  Rate: 63  Rhythm: normal sinus rhythm and sinus arrhythmia  QRS Axis: normal  Intervals: normal  ST/T Wave abnormalities: normal  Conduction Disutrbances:none  Narrative Interpretation: Normal EKG.  Old EKG Reviewed: unchanged    Carleene Cooper III, MD 05/18/11 [phone]

## 2011-05-18 NOTE — Progress Notes (Signed)
ANTICOAGULATION CONSULT NOTE - Initial Consult  Pharmacy Consult for heparin Indication: USAP  No Known Allergies  Patient Measurements: Height: 5\' 3"  (160 cm) Weight: 161 lb (73.029 kg) IBW/kg (Calculated) : 56.9    Vital Signs: Temp: 98.8 F (37.1 C) (05/02 0901) Temp src: Oral (05/02 0901) BP: 140/74 mmHg (05/02 1115) Pulse Rate: 64  (05/02 1115)  Labs:  [facility] 05/18/11 0951  HGB 13.2  HCT 39.8  PLT 131*  APTT 30  LABPROT 14.9  INR 1.15  HEPARINUNFRC --  CREATININE 0.98  CKTOTAL --  CKMB --  TROPONINI --   Estimated Creatinine Clearance: 49.3 ml/min (by C-G formula based on Cr of 0.98).  Medical History: Past Medical History  Diagnosis Date  . Hyperlipidemia   . History of upper GI bleeding     8 units for GI bleed   . History of primary tuberculosis   . CAD (coronary artery disease)     Dr. Donnie Aho is his Cardiologist  . Blood transfusion     Had 8 units for GI bleed  . Pneumonia     h/o walking pneumonia at least 20 years ago  . GERD (gastroesophageal reflux disease)   . Hypertension   . Tuberculosis 1969    Was in sanitorium for 1 year  . Sinus drainage     11/24/10 "every morning when I get up I have to clear it out"  . Sleep disorder     "trouble staying asleep"  . Dizzy     "when I take my blood pressure medicine sometimes it makes me a little dizzy"  . Anemia   . Arthritis     Medications:   (Not in a hospital admission)  Assessment: 78 yo presents to ED after episodes of chest discomfort, starting heparin for USAP.   Goal of Therapy:  Heparin level 0.3-0.7 units/ml   Plan:  Heparin bolus 3000 units. Heparin drip at 1000 units/hr. Check heparin level 8 hours after start of drip and daily while on heparin. CBC daily while on heparin.  Talbert Cage Poteet 05/18/2011,12:04 PM

## 2011-05-18 NOTE — H&P (Signed)
Jacob Hampton, [place]    Date of visit:  05/18/2011 DOB:  Aug 25, 1924    Age:  78 yrs. Medical record number:  [id number]     Account number:  [id number] Primary Care Provider: Warrick Parisian ____________________________ CURRENT DIAGNOSES  1. Unstable angina pectoris   2. CAD,Native  3. Stent Placement  4. Peripheral Vascular Disease  5. Surgery-Aortocoronary Bypass Grafting  6. Hyperlipidemia  7. Personal History Of Tuberculosis  8. Personal History Of Peptic Ulcer Disease ____________________________ ALLERGIES  aspirin, Bleeding ulcer ____________________________ MEDICATIONS  1. Lipitor 80 mg Tablet, 1 p.o. q.d.  2. folic acid 1 mg tablet, 1 p.o. q.d.  3. Aspirin Low-Strength 81 mg tablet, chewable, 1 p.o. daily  4. metoprolol succinate 100 mg tablet extended release 24 hr, 1/2 tab p.o. daily  5. nitroglycerin 0.4 mg tablet, sublingual, PRN ____________________________ HISTORY OF PRESENT ILLNESS  This 78 year old male is admitted for evaluation of chest discomfort. The patient has a known history of coronary artery disease with redo bypass grafting in November. He had early failure of both of his grafts and in February had a bare-metal stent placed to the proximal right coronary artery leaving moderately severe distal stenosis in the right coronary artery and at the same setting had a bare-metal stent placed in the ostium of the radial graft to marginal branch. He did remarkably well following that and discontinued his Plavix after around one month and continued aspirin. He has a prior history of severe GI bleeding for which he has required transfusions and was the reason he had a bare-metal stent placed. He was seen recently in the office which time he was doing well and had been able to return to bowling.  He had an recurrence of angina on Monday while walking up a hill and again the next day with exertion.  He presented to the emergency room this morning with chest discomfort consistent  with his angina that occurred last evening and took 2 nitroglycerin with relief.  He had recurrent chest pain this am relieved with nitroglycerin again this morning and presented to the emergency room. The discomfort lasts around 20-30 minutes and was intermittent and relieved with nitroglycerin. This is the first recurrence of his symptoms since his percutaneous intervention. He denies angina and has no PND, orthopnea or edema. He does have a prior history of some transient atrial fibrillation requiring amiodarone at the time of his previous bypass. ____________________________ PAST HISTORY  Past Medical Illnesses:  hyperlipidemia, history of tuberculosis, history of peptic ulcer disease;  Cardiovascular Illnesses:  CAD, dilated left subclavian  artery;  Surgical Procedures:  CABG w LIMA to LAD, SVG to dx, SVG to OM, SVg to PD-PL 03/25/99 Dr. Cornelius Moras, herniorrhaphy, R. testicle surgery, gastrectomy-subtotal, Redo CABG 11/12 Dr. Cornelius Moras left radial to OM, SVG to RCA;  Cardiology Procedures-Invasive:  PTCA of the RCA 1988, PTCA of the RCA 1991, cardiac cath (left) October 2012, cardiac cath (left) February 2013, BMS stent 2013 to prox RCA and ostium of radial graft to OM;  Cardiac Cath Results:  99% stenosis distal Left main, occluded LAD, 99% stenosis proximal RCA, 70% stenosis mid RCA, occluded RCA SVG, ostial stenosis of radial graft to OM, occluded Diag 1 SVG, widely patent LAD LIMA graft;  Peripheral Vascular Procedures:  CT scan of subclavian 7/01/24/2004;  LVEF of 60% documented via cardiac cath on 11/16/2010 ____________________________ CARDIO-PULMONARY TEST DATES EKG Date:  02/23/2011;   Cardiac Cath Date:  03/01/2011;  CABG: 11/22/2010;  Stent Placement Date: 10/01/1989;  Chest  Xray Date: 11/15/2010;  CT Scan Date:  02/04/2005   ____________________________ FAMILY HISTORY Father - age 1, died of stomach cancer; Mother - age 82,  deceased; Brother 1 - age 2,  died of CVA; Brother 2 - age 56, died of brain  cancer; Brother 3 -  alive and well; Sister 1 - age 8,  alive and well;  ____________________________ SOCIAL HISTORY Alcohol Use:  no alcohol use;  Smoking:  used to smoke but quit 1996;  Diet:  regular diet;  Lifestyle:  widower, 2 sons and 2 daughters;  Exercise:  treadmill and bowling;  Occupation:  retired and [employer];  Residence:  lives with son;   ____________________________ REVIEW OF SYSTEMS General:  Normally feels well Eyes:  glaucoma, cataract extraction bilaterally  Respiratory:  denies dyspnea, cough, wheezing or hemoptysis. Cardiovascular:  please review HPI  Abdominal:  denies dyspepsia, GI bleeding, constipation, or diarrhea  Genitourinary-Male:  frequency, nocturia  Musculoskeletal:  arthritis of the knee  Other than as noted above the remainder of the systems is unremarkable. ____________________________ PHYSICAL EXAMINATION VITAL SIGNS  Blood Pressure:  145/71   Pulse:  64/min. Respirations:  12/min. Weight:  .00 lbs.  Constitutional:  pleasant elderly white male in no acute distress Skin:  scattered hemangiomas Head:  normocephalic, normal hair pattern, no masses or tenderness Eyes:  EOMS Intact, PERRLA, C and S clear, Funduscopic exam not done. ENT:  ears, nose and throat unremarkable, edentulous Neck:  supple, no masses, thyromegaly, JVD. Carotid pulses are full and equal bilaterally without bruits. Chest:  clear to auscultation and percussion, healing median sternotomy scar Cardiac:  regular rhythm, normal S1 and S2, No S3 or S4, no murmurs, gallops or rubs detected. Abdomen:  abdomen soft,non-tender, no masses, no hepatospenomegaly, or aneurysm noted Peripheral Pulses:  femoral pulses 2+, posterior tibial pulses 3+, dorsalis pedis pulses 2+ Extremities & Back:  well healed saphenous vein donor site LLE, no edema present Neurological:  no gross motor or sensory deficits noted, affect appropriate, oriented  x3. ____________________________ MOST RECENT LIPID PANEL 02/28/11  CHOL TOTL 117 mg/dl, LDL 61 calc, HDL 42 mg/dl and TRIGLYCER 68 mg/dl ____________________________ IMPRESSIONS/PLAN  1. Unstable angina pectoris 2. Coronary artery graft disease with early occlusion of redo grafts and treated with bare metal stenting in February. 3. Severe native vessel coronary artery disease  4. Hyperlipidemia under treatment  5. Previous history of GI bleeding requiring transfusions  6. Previous history of atrial fibrillation following surgery  Recommendations:  Admit to hospital and begin intravenous heparin. Check serial cardiac enzymes. Likely for a repeat catheterization to look for either progression of disease or early in-stent restenosis. Restart Plavix.  ____________________________ Cardiology Physician:  [name] MD [facility]

## 2011-05-19 ENCOUNTER — Encounter (HOSPITAL_COMMUNITY)
Admission: EM | Disposition: A | Discharge status: Home / Self Care | Payer: Self-pay | Source: Home / Self Care | Attending: Cardiology

## 2011-05-19 HISTORY — PX: PERCUTANEOUS CORONARY STENT INTERVENTION (PCI-S): SHX5485

## 2011-05-19 HISTORY — PX: LEFT HEART CATHETERIZATION WITH CORONARY/GRAFT ANGIOGRAM: SHX5450

## 2011-05-19 LAB — CARDIAC PANEL(CRET KIN+CKTOT+MB+TROPI)
Relative Index: INVALID (ref 0.0–2.5)
Total CK: 81 U/L (ref 7–232)

## 2011-05-19 LAB — HEPARIN LEVEL (UNFRACTIONATED): Heparin Unfractionated: 1.01 IU/mL — ABNORMAL HIGH (ref 0.30–0.70)

## 2011-05-19 LAB — CBC
Hemoglobin: 12.4 g/dL — ABNORMAL LOW (ref 13.0–17.0)
MCHC: 33.4 g/dL (ref 30.0–36.0)
Platelets: 110 10*3/uL — ABNORMAL LOW (ref 150–400)
RBC: 3.92 MIL/uL — ABNORMAL LOW (ref 4.22–5.81)

## 2011-05-19 SURGERY — LEFT HEART CATHETERIZATION WITH CORONARY/GRAFT ANGIOGRAM
Anesthesia: LOCAL

## 2011-05-19 MED ORDER — COLCHICINE 0.6 MG PO TABS: 0.6000 mg | ORAL_TABLET | Freq: Every day | ORAL | Status: DC

## 2011-05-19 MED ORDER — PANTOPRAZOLE SODIUM 40 MG PO TBEC: 40.0000 mg | DELAYED_RELEASE_TABLET | Freq: Every day | ORAL | Status: AC

## 2011-05-19 MED ORDER — CLOPIDOGREL BISULFATE 75 MG PO TABS
75.0000 mg | ORAL_TABLET | Freq: Every day | ORAL | Status: DC
Start: 2011-05-20 — End: 2011-05-20
  Filled 2011-05-19: qty 1

## 2011-05-19 MED ORDER — NITROGLYCERIN 0.2 MG/ML ON CALL CATH LAB
INTRAVENOUS | Status: AC
  Filled 2011-05-19: qty 1

## 2011-05-19 MED ORDER — HEPARIN (PORCINE) IN NACL 2-0.9 UNIT/ML-% IJ SOLN
INTRAMUSCULAR | Status: AC
  Filled 2011-05-19: qty 2000

## 2011-05-19 MED ORDER — COLCHICINE 0.6 MG PO TABS
0.6000 mg | ORAL_TABLET | Freq: Every day | ORAL | Status: DC
  Administered 2011-05-19: 0.6 mg via ORAL
  Filled 2011-05-19 (×2): qty 1

## 2011-05-19 MED ORDER — FENTANYL CITRATE 0.05 MG/ML IJ SOLN
INTRAMUSCULAR | Status: AC
  Filled 2011-05-19: qty 2

## 2011-05-19 MED ORDER — BIVALIRUDIN 250 MG IV SOLR
INTRAVENOUS | Status: AC
  Filled 2011-05-19: qty 250

## 2011-05-19 MED ORDER — FAMOTIDINE IN NACL 20-0.9 MG/50ML-% IV SOLN
INTRAVENOUS | Status: AC
  Filled 2011-05-19: qty 50

## 2011-05-19 MED ORDER — LIDOCAINE HCL (PF) 1 % IJ SOLN
INTRAMUSCULAR | Status: AC
  Filled 2011-05-19: qty 30

## 2011-05-19 MED ORDER — SODIUM CHLORIDE 0.9 % IJ SOLN
3.0000 mL | Freq: Two times a day (BID) | INTRAMUSCULAR | Status: DC
  Administered 2011-05-19: 3 mL via INTRAVENOUS

## 2011-05-19 MED ORDER — PANTOPRAZOLE SODIUM 40 MG PO TBEC
40.0000 mg | DELAYED_RELEASE_TABLET | Freq: Every day | ORAL | Status: DC
  Administered 2011-05-19: 40 mg via ORAL
  Filled 2011-05-19: qty 1

## 2011-05-19 MED ORDER — SODIUM CHLORIDE 0.9 % IV SOLN
1.0000 mL/kg/h | INTRAVENOUS | Status: AC
  Administered 2011-05-19: 1 mL/kg/h via INTRAVENOUS

## 2011-05-19 MED ORDER — CLOPIDOGREL BISULFATE 300 MG PO TABS
ORAL_TABLET | ORAL | Status: AC
  Filled 2011-05-19: qty 2

## 2011-05-19 MED ORDER — MORPHINE SULFATE 2 MG/ML IJ SOLN: 1.0000 mg | INTRAMUSCULAR | Status: DC | PRN

## 2011-05-19 NOTE — Discharge Summary (Addendum)
Physician Discharge Summary  Patient ID: Jacob Hampton 78 y.o.male MRN: [id number] DOB/AGE: 07/23/1924   Admit date: 05/18/2011 Discharge date: 05/19/2011  Admit date: 05/18/2011  Discharge date: 05/20/2011   Primary Discharge Diagnosis:  1. Unstable angina pectoris   Secondary Discharge Diagnosis:  2. CAD,Native  3. Stent restenosis  4. Peripheral Vascular Disease  5. Surgery-Aortocoronary Bypass Grafting  6. Hyperlipidemia  7. Personal History Of Tuberculosis  8. Personal History Of Peptic Ulcer Disease   Procedures: Cardiac catheterization, insertion drug-eluting stent within the previously placed bare-metal stent by Dr. Alanda Slim Course:  This 78 year old male has previous bypass grafting for graft failure and had early graft failure following his bypass grafting last year. He was initially treated medically and in February had a bare-metal stent placed in the right coronary artery as well as the proximal portion of a radial graft to the marginal branch. The previously placed stent was actually within a short segment of saphenous vein was used to attach the radial graft to the aorta. The patient had done well but presented with crescendo angina and was admitted to the hospital.   He was heparinized overnight and taken to the cardiac catheterization laboratory the next morning. Serial cardiac enzymes were normal. Cardiac catheterization revealed an occluded left main. The previously placed stent in the right coronary artery was widely patent but he had moderately severe disease distal to that it was unchanged in the rightcoronary artery the we have elected to treat medically. The mammary graft was patent on flush injections but was not selectively visualized. The previous radial graft had a severe in-stent restenosis estimated at 90% Dr. Eldridge Dace performed per case intervention first with a balloon and then placed a 3.5 x 16 mm Promus stent that was post dilated to 4.2 mm. This gave  an excellent angiographic result. He was stable following the procedure and was seen the next morning was discharged home in stable condition. He will followup with me in one week. We opted to place a drug-eluting stent in the vessel despite his previous history of GI bleeding because of the early restenosis of the bare-metal stent. He will be continued on dual platelet there be as long as we can continue him on it. In addition he will be placed on colchicine to try to cut down on restenosis.   Discharge Exam: Blood pressure 178/84, pulse 73, temperature 98.1 F (36.7 C), temperature source Oral, resp. rate 18, height 5\' 3"  (1.6 m), weight 71.8 kg (158 lb 4.6 oz), SpO2 92.00%.   The right groin cath site is unremarkable  Labs: CBC:   Lab Results  Component Value Date   WBC 7.3 05/19/2011   HGB 12.4* 05/19/2011   HCT 37.1* 05/19/2011   MCV 94.6 05/19/2011   PLT 110* 05/19/2011   CMP:  Lab 05/18/11 1151  NA 139  K 4.4  CL 106  CO2 28  BUN 19  CREATININE 1.02  CALCIUM 8.8  PROT 6.7  BILITOT 0.9  ALKPHOS 78  ALT 17  AST 24  GLUCOSE 89   Lipid Panel     Component Value Date/Time   CHOL 117 03/01/2011 0531   TRIG 68 03/01/2011 0531   HDL 42 03/01/2011 0531   CHOLHDL 2.8 03/01/2011 0531   VLDL 14 03/01/2011 0531   LDLCALC 61 03/01/2011 0531   Cardiac Enzymes:  Basename 05/18/11 2352 05/18/11 1728 05/18/11 1151  CKTOTAL 81 92 97  CKMB 2.9 3.3 3.4  CKMBINDEX -- -- --  TROPONINI <0.30 <0.30 <0.30    Radiology: Chronic scarring, no acute disease  EKG: Normal sinus rhythm, left axis deviation  Discharge Medications:  Cutter, [place]  Home Medication Instructions [id number]   Printed on:05/19/11 1548  Medication Information                    folic acid (FOLVITE) 1 MG tablet Take 1 mg by mouth daily.             atorvastatin (LIPITOR) 80 MG tablet Take 80 mg by mouth at bedtime.            metoprolol (TOPROL-XL) 100 MG 24 hr tablet Take 50 mg by mouth daily.               nitroGLYCERIN (NITROSTAT) 0.4 MG SL tablet Place 0.4 mg under the tongue every 5 (five) minutes as needed. For chest pain           clopidogrel (PLAVIX) 75 MG tablet Take 75 mg by mouth daily with breakfast.           aspirin 81 MG tablet Take 81 mg by mouth daily.           colchicine 0.6 MG tablet Take 1 tablet (0.6 mg total) by mouth daily.           pantoprazole (PROTONIX) 40 MG tablet Take 1 tablet (40 mg total) by mouth daily at 12 noon.             Followup plans and appointments: Dr. Donnie Aho in one week.      Signed: [name] MD [facility]  Darci Needle, III, MD, [facility]

## 2011-05-19 NOTE — Clinical Social Work Psychosocial (Signed)
     Clinical Social Work Department BRIEF PSYCHOSOCIAL ASSESSMENT 05/19/2011  Patient:  Jacob Hampton, Jacob Hampton     Account Number:  [id number]     Admit date:  05/18/2011  Clinical Social Worker:  Hulan Fray  Date/Time:  05/19/2011 02:20 PM  Referred by:  RN  Date Referred:  05/18/2011 Referred for  Advanced Directives   Other Referral:   Interview type:  Patient Other interview type:    PSYCHOSOCIAL DATA Living Status:  WITH ADULT CHILDREN Admitted from facility:   Level of care:   Primary support name:  Baxter Flattery Primary support relationship to patient:  CHILD, ADULT Degree of support available:   adequate    CURRENT CONCERNS Current Concerns  None Noted   Other Concerns:    SOCIAL WORK ASSESSMENT / PLAN Clinical Social Worker received referral for advance directive request. CSW spoke with patient regarding this referral and patient stated that he is no longer interested in the advance directive packet. CSW informed patient that if he becomes interested again, then social work will return with the packet. CSW will sign off as social work intervention is no longer needed.   Assessment/plan status:  No Further Intervention Required Other assessment/ plan:   Information/referral to community resources:    PATIENTS/FAMILYS RESPONSE TO PLAN OF CARE: Patient declined advance directive packet.

## 2011-05-19 NOTE — CV Procedure (Signed)
CARDIAC CATHETERIZATION REPORT   Jacob Hampton  78 y.o. male  DOB: 1924/09/07  MRN: [id number] Today's date: 05/19/2011   PROCEDURE:  Left heart catheterization with selective coronary angiography, bypass graft angiograms  INDICATIONS:  Unstable angina  The risks, benefits, and details of the procedure were explained to the patient.  The patient verbalized understanding and wanted to proceed.  Informed written consent was obtained.  PROCEDURE TECHNIQUE:  After Xylocaine anesthesia a 57F sheath was placed in the right femoral artery with a single anterior needle wall stick.   Left coronary angiography was done using a Judkins L4 guide catheter.  Right coronary angiography was done using a Judkins R4 guide catheter. An Amplatz left 1 catheter was used to select the radial graft to the marginal branch. Internal mammary was attempted but did not easily selective because it was visualized in bed were a we opted not to visualize this. Left ventriculogram was not performed. At this point Dr. Eldridge Dace reviewed the films and with severe restenosis in the stent to the radial graft, PCI was performed. At this point he scrubbed into the case and I turned the case over to him.  CONTRAST:   45 cc.  COMPLICATIONS:  None.    HEMODYNAMICS:  Aortic pressure was 160/80. The aortic valve was not crossed.   ANGIOGRAPHIC DATA:    CORONARY ARTERIES:   Arise and distribute normally.  Right dominant. Significant coronary calcification is noted.  Left main coronary artery: Occluded distally with very faint antegrade flow.  Left anterior descending: Occluded proximally.  Circumflex coronary artery: Occluded proximally  Right coronary artery: Proximal stent is widely patent. The midportion of the right coronary artery has a 70% segmental stenosis.  Saphenous vein graft to right coronary artery was previously demonstrated to be occluded.  Left radial graft to the marginal  branch has a severe proximal restenosis estimated at 95% at the previous stent  Mammary graft to the LAD was not visualized. It was previously patent in February.  LEFT VENTRICULOGRAM:  Not performed  IMPRESSIONS:  1. Severe native coronary artery disease with occlusion of the left main 2. Patent stent to the right coronary artery proximally with moderately severe disease in the midportion unchanged 3. Severe in-stent restenosis of the vein graft segment that was placed between the aorta and the previous radial graft 4. Left ventriculogram not performed  RECOMMENDATION:  Percutaneous intervention of the radial graft with a segment of saphenous vein graft involvement. Discussed with Dr. Eldridge Dace.. May consider drug-eluting stent despite the risks of his previous GI bleeding because of the in-stent restenosis.   [name] MD [facility]

## 2011-05-19 NOTE — Interval H&P Note (Signed)
History and Physical Interval Note:  05/19/2011 7:43 AM  Jacob Hampton  has presented today for surgery, with the diagnosis of unstable angina  The various methods of treatment have been discussed with the patient and family. After consideration of risks, benefits and other options for treatment, the patient has consented to  Procedure: LEFT HEART CATHETERIZATION WITH CORONARY/GRAFT ANGIOGRAM and possible PCI.  Marland Kitchen  The patients' history has been reviewed, patient examined, no change in status, stable for surgery.  I have reviewed the patients' chart and labs.  Questions were answered to the patient's satisfaction.    [name] MD [facility]

## 2011-05-20 LAB — BASIC METABOLIC PANEL
BUN: 19 mg/dL (ref 6–23)
CO2: 25 mEq/L (ref 19–32)
Calcium: 9.3 mg/dL (ref 8.4–10.5)
Chloride: 103 mEq/L (ref 96–112)
Creatinine, Ser: 1.1 mg/dL (ref 0.50–1.35)
Glucose, Bld: 93 mg/dL (ref 70–99)

## 2011-05-20 LAB — CBC
HCT: 39.9 % (ref 39.0–52.0)
Hemoglobin: 13.3 g/dL (ref 13.0–17.0)
MCH: 31.6 pg (ref 26.0–34.0)
MCV: 94.8 fL (ref 78.0–100.0)
RBC: 4.21 MIL/uL — ABNORMAL LOW (ref 4.22–5.81)

## 2011-05-20 NOTE — Progress Notes (Signed)
CARDIAC REHAB PHASE I     Completed discharge education with patient and patient's son. Reviewed Orientation to CRP I, Exercise guidelines, Diet, Stent, CP/NTG use, and when to call MD. Patient voices understanding. Patient has ambulated in hallway this morning independently with no complaints or CP.    BROWN, Versa Craton L, MS, NASM, CES

## 2011-05-20 NOTE — Discharge Instructions (Addendum)
Call Dr. Donnie Aho if chest painCoronary Angiography with Stent This is a procedure to widen or open a narrow blood vessel of the heart (coronary artery). When a coronary artery becomes partially blocked it decreases blood flow to that area. This may lead to chest pain or a heart attack (myocardial infarction). Arteries may become blocked by cholesterol buildup (plaque) in the lining or wall. A stent is a small piece of metal that looks like a mesh or a spring. Stent placement may be done right after an angiogram that finds a blocked artery or as a treatment for a heart attack. RISKS AND COMPLICATIONS  Damage to the heart.   A blockage may return.   Bleeding at the site.   Blood clot to another part of the body.  PROCEDURE  You may be given a medication to help you relax before and during the procedure through an IV in your hand or arm.   A local anesthetic to make the area numb may be used before inserting the catheter (a long, hollow tube about the size of a piece of cooked spaghetti).   You will be prepared for the procedure by washing and shaving the area where the catheter will be inserted. This is usually done in the groin.   A specially trained doctor will insert the catheter with a guide wire into an artery. This is guided under a special type of X-ray (fluoroscopy) to the opening of the blocked artery.   Special dye is then injected and X-rays are taken.   A tiny wire is guided to the blocked spot and a balloon is inflated to make the artery wider. The stent is expanded and crushes the plaque into the wall of the vessel. The stent holds the area open like a scaffolding and improves the blood flow.   Sometimes the artery may be made wider using a laser or other tools to remove plaque.   When the blood flow is better, the catheter is removed. The lining of the artery will grow over the stent which stays where it was placed.  AFTER THE PROCEDURE  You will stay in bed for several  hours.   The access site will be watched and you will be checked frequently.   Blood tests, X-rays and an EKG may be done.   You may stay in the hospital overnight for observation.  SEEK IMMEDIATE MEDICAL CARE IF:   You develop chest pain, shortness of breath, feel faint, or pass out.   There is bleeding, swelling, or drainage from the catheter insertion site.   You develop pain, discoloration, coldness, or severe bruising in the leg or arm that held the catheter.   You see blood in your urine or stool. This may be bright red blood in urine or stools, or also appear as black, tarry stools.   You have a fever.  Document Released: 07/09/2002 Document Revised: 12/22/2010 Document Reviewed: 03/01/2007 [facility] Patient Information 2012 [place], [place].Groin Site Care Refer to this sheet in the next few weeks. These instructions provide you with information on caring for yourself after your procedure. Your caregiver may also give you more specific instructions. Your treatment has been planned according to current medical practices, but problems sometimes occur. Call your caregiver if you have any problems or questions after your procedure. HOME CARE INSTRUCTIONS  You may shower 24 hours after the procedure. Remove the bandage (dressing) and gently wash the site with plain soap and water. Gently pat the site dry.  Do not apply powder or lotion to the site.   Do not sit in a bathtub, swimming pool, or whirlpool for 5 to 7 days.   No bending, squatting, or lifting anything over 10 pounds (4.5 kg) as directed by your caregiver.   Inspect the site at least twice daily.   Do not drive home if you are discharged the same day of the procedure. Have someone else drive you.   You may drive 24 hours after the procedure unless otherwise instructed by your caregiver.  What to expect:  Any bruising will usually fade within 1 to 2 weeks.   Blood that collects in the tissue (hematoma) may be  painful to the touch. It should usually decrease in size and tenderness within 1 to 2 weeks.  SEEK IMMEDIATE MEDICAL CARE IF:  You have unusual pain at the groin site or down the affected leg.   You have redness, warmth, swelling, or pain at the groin site.   You have drainage (other than a small amount of blood on the dressing).   You have chills.   You have a fever or persistent symptoms for more than 72 hours.   You have a fever and your symptoms suddenly get worse.   Your leg becomes pale, cool, tingly, or numb.   You have heavy bleeding from the site. Hold pressure on the site.  Document Released: 02/04/2010 Document Revised: 12/22/2010 Document Reviewed: 02/04/2010 [facility] Patient Information 2012 [place], [place].

## 2011-05-22 MED FILL — Dextrose Inj 5%: INTRAVENOUS | Qty: 50 | Status: AC

## 2011-05-22 NOTE — CV Procedure (Addendum)
PROCEDURE:  Left heart catheterization, left ventriculogram, PCI Radial graft to OM.  INDICATIONS:  Unstable angina  The risks, benefits, and details of the procedure were explained to the patient.  The patient verbalized understanding and wanted to proceed.  Informed written consent was obtained.   PROCEDURE TECHNIQUE:    Dr. Donnie Aho performed the diagnostic cath revealing an ostial 90% instent restenosis in the BMS to the OM graft.   Intervention was done using a Judkins R4 guide catheter.  Left ventriculography was done using a pigtail catheter.  Angiomax for anticoagulation.  An ACT was used to confirm that the Angiomax is therapeutic.  A 2.5 x 10 cutting balloon was used to predilate the lesion.  A 3.5 x 16 Promus stent was then deployed across the entire diseased area, covering the prior stent.  The ostium of this new stent was flared in the aorta.  The stented area was postdilated with a 4.0 x 12 mm noncompliant balloon.  At the end of the procedure, there is no residual stenosis.  Lesion length 11 mm   CONTRAST:  Total of 105 cc.  COMPLICATIONS:  None.    HEMODYNAMICS:  Aortic pressure was 172/81; LV pressure was 174/15; LVEDP 24.  There was no gradient between the left ventricle and aorta.      LEFT VENTRICULOGRAM:  Left ventricular angiogram was done in the 30 RAO projection and revealed normal left ventricular wall motion and systolic function with an estimated ejection fraction of 55%.  LVEDP was 24 mmHg.  IMPRESSIONS:  1. Successful DES to the ostium of the radial graft to OM, restenosis  With a 3.5 x 16 Promus stent, postdilated to 4.2 mm in diameter. 2. Normal left ventricular systolic function.  LVEDP 24 mmHg.  Ejection fraction 55%.  RECOMMENDATION:  Prolonged dual antiplatelet therapy, based on how he does from a bleeding standpoint.  Watch overnight.  He will followup with Dr. Donnie Aho.

## 2011-09-08 ENCOUNTER — Other Ambulatory Visit: Payer: Self-pay | Admitting: Cardiology

## 2013-12-25 ENCOUNTER — Encounter (HOSPITAL_COMMUNITY): Payer: Self-pay | Admitting: Interventional Cardiology

## 2015-05-09 ENCOUNTER — Encounter (HOSPITAL_BASED_OUTPATIENT_CLINIC_OR_DEPARTMENT_OTHER): Payer: Self-pay | Admitting: *Deleted

## 2015-05-09 ENCOUNTER — Emergency Department (HOSPITAL_BASED_OUTPATIENT_CLINIC_OR_DEPARTMENT_OTHER)
Admission: EM | Admit: 2015-05-09 | Discharge: 2015-05-09 | Disposition: A | Discharge status: Home / Self Care | Payer: Medicare Other | Attending: Emergency Medicine | Admitting: Emergency Medicine

## 2015-05-09 ENCOUNTER — Emergency Department (HOSPITAL_BASED_OUTPATIENT_CLINIC_OR_DEPARTMENT_OTHER): Payer: Medicare Other

## 2015-05-09 DIAGNOSIS — Z8611 Personal history of tuberculosis: Secondary | ICD-10-CM | POA: Insufficient documentation

## 2015-05-09 DIAGNOSIS — Z87891 Personal history of nicotine dependence: Secondary | ICD-10-CM | POA: Insufficient documentation

## 2015-05-09 DIAGNOSIS — I25119 Atherosclerotic heart disease of native coronary artery with unspecified angina pectoris: Secondary | ICD-10-CM | POA: Insufficient documentation

## 2015-05-09 DIAGNOSIS — Z8719 Personal history of other diseases of the digestive system: Secondary | ICD-10-CM | POA: Diagnosis not present

## 2015-05-09 DIAGNOSIS — I10 Essential (primary) hypertension: Secondary | ICD-10-CM | POA: Diagnosis not present

## 2015-05-09 DIAGNOSIS — Z8701 Personal history of pneumonia (recurrent): Secondary | ICD-10-CM | POA: Diagnosis not present

## 2015-05-09 DIAGNOSIS — R011 Cardiac murmur, unspecified: Secondary | ICD-10-CM | POA: Diagnosis not present

## 2015-05-09 DIAGNOSIS — N289 Disorder of kidney and ureter, unspecified: Secondary | ICD-10-CM | POA: Diagnosis not present

## 2015-05-09 DIAGNOSIS — Z79899 Other long term (current) drug therapy: Secondary | ICD-10-CM | POA: Insufficient documentation

## 2015-05-09 DIAGNOSIS — N201 Calculus of ureter: Secondary | ICD-10-CM | POA: Insufficient documentation

## 2015-05-09 DIAGNOSIS — Z862 Personal history of diseases of the blood and blood-forming organs and certain disorders involving the immune mechanism: Secondary | ICD-10-CM | POA: Diagnosis not present

## 2015-05-09 DIAGNOSIS — M1990 Unspecified osteoarthritis, unspecified site: Secondary | ICD-10-CM | POA: Diagnosis not present

## 2015-05-09 DIAGNOSIS — E785 Hyperlipidemia, unspecified: Secondary | ICD-10-CM | POA: Insufficient documentation

## 2015-05-09 DIAGNOSIS — R1030 Lower abdominal pain, unspecified: Secondary | ICD-10-CM | POA: Diagnosis present

## 2015-05-09 DIAGNOSIS — R1031 Right lower quadrant pain: Secondary | ICD-10-CM

## 2015-05-09 LAB — BASIC METABOLIC PANEL
ANION GAP: 12 (ref 5–15)
BUN: 27 mg/dL — AB (ref 6–20)
CHLORIDE: 102 mmol/L (ref 101–111)
CO2: 20 mmol/L — ABNORMAL LOW (ref 22–32)
CREATININE: 1.76 mg/dL — AB (ref 0.61–1.24)
Calcium: 8.7 mg/dL — ABNORMAL LOW (ref 8.9–10.3)
GFR, EST AFRICAN AMERICAN: 38 mL/min — AB (ref >60)
GFR, EST NON AFRICAN AMERICAN: 33 mL/min — AB (ref >60)
Glucose, Bld: 138 mg/dL — ABNORMAL HIGH (ref 65–99)
Potassium: 4 mmol/L (ref 3.5–5.1)
SODIUM: 134 mmol/L — AB (ref 135–145)

## 2015-05-09 LAB — CBC WITH DIFFERENTIAL/PLATELET
BASOS ABS: 0.1 10*3/uL (ref 0.0–0.1)
Basophils Relative: 1 %
EOS ABS: 0 10*3/uL (ref 0.0–0.7)
Eosinophils Relative: 0 %
HCT: 42 % (ref 39.0–52.0)
HEMOGLOBIN: 14.2 g/dL (ref 13.0–17.0)
LYMPHS ABS: 0.1 10*3/uL — AB (ref 0.7–4.0)
Lymphocytes Relative: 1 %
MCH: 33.3 pg (ref 26.0–34.0)
MCHC: 33.8 g/dL (ref 30.0–36.0)
MCV: 98.6 fL (ref 78.0–100.0)
MONO ABS: 0.3 10*3/uL (ref 0.1–1.0)
Monocytes Relative: 3 %
NEUTROS PCT: 95 %
Neutro Abs: 10.7 10*3/uL — ABNORMAL HIGH (ref 1.7–7.7)
PLATELETS: 145 10*3/uL — AB (ref 150–400)
RBC: 4.26 MIL/uL (ref 4.22–5.81)
RDW: 14.4 % (ref 11.5–15.5)
WBC: 11.2 10*3/uL — AB (ref 4.0–10.5)

## 2015-05-09 LAB — URINALYSIS, ROUTINE W REFLEX MICROSCOPIC
BILIRUBIN URINE: NEGATIVE
Glucose, UA: NEGATIVE mg/dL
KETONES UR: NEGATIVE mg/dL
NITRITE: NEGATIVE
PROTEIN: NEGATIVE mg/dL
SPECIFIC GRAVITY, URINE: 1.016 (ref 1.005–1.030)
pH: 5.5 (ref 5.0–8.0)

## 2015-05-09 LAB — URINE MICROSCOPIC-ADD ON

## 2015-05-09 MED ORDER — HYDROCODONE-ACETAMINOPHEN 5-325 MG PO TABS: 0.5000 | ORAL_TABLET | Freq: Four times a day (QID) | ORAL | Status: DC | PRN

## 2015-05-09 MED ORDER — CEPHALEXIN 500 MG PO CAPS: 500.0000 mg | ORAL_CAPSULE | Freq: Two times a day (BID) | ORAL | Status: DC

## 2015-05-09 MED ORDER — SODIUM CHLORIDE 0.9 % IV BOLUS (SEPSIS)
500.0000 mL | Freq: Once | INTRAVENOUS | Status: AC
  Administered 2015-05-09: 500 mL via INTRAVENOUS

## 2015-05-09 NOTE — ED Provider Notes (Signed)
CSN: [id number]     Arrival date & time 05/09/15  1146 History   First MD Initiated Contact with Patient 05/09/15 1157     Chief Complaint  Patient presents with  . Groin Pain     Patient is a 82 y.o. male presenting with groin pain. The history is provided by the patient.  Groin Pain This is a new problem. Associated symptoms include abdominal pain. Pertinent negatives include no chest pain, no headaches and no shortness of breath.  Patient presented with severe right groin pain. Began somewhat acutely. States he had put a pillow on a try and help the pain. States he had nausea and some vomiting along with chills. No dysuria. No diarrhea. He has not had pain like this before. Reportedly has had a previous hernia repair but that was several years ago. Pain is feeling somewhat better now but still aches. Pain is described as severe and crampy.  Past Medical History  Diagnosis Date  . Hyperlipidemia   . History of upper GI bleeding     8 units for GI bleed   . History of primary tuberculosis   . CAD (coronary artery disease)     Dr. Wynonia Lawman is his Cardiologist  . Blood transfusion     Had 8 units for GI bleed  . Pneumonia     h/o walking pneumonia at least 20 years ago  . GERD (gastroesophageal reflux disease)   . Hypertension   . Sinus drainage     11/24/10 "every morning when I get up I have to clear it out"  . Sleep disorder     "trouble staying asleep"  . Dizzy     "when I take my blood pressure medicine sometimes it makes me a little dizzy"  . Anemia   . Arthritis   . Tuberculosis 1969    Was in sanitorium for 1 year  . Heart murmur   . Shortness of breath 05/18/11    "sometimes lying down; sometimes w/activity"  . Angina   . Kidney stones    Past Surgical History  Procedure Laterality Date  . Partial gastrectomy    . Right testicle surgery    . Other surgical history  03/25/1999    reexploration for bleeding s/p CABG - coagulopathy without mechanical source  . Redo  cabg  11/22/2010    CABG x2 with left radial to OM and SVG to RCA  . Cholecystectomy      "w/bleeding ulcer OR"  . Bleeding ulcer      w/gallbladder  . Appendectomy    . Cataract extraction w/ intraocular lens implant      bilaterally  . Radial artery harvest  11/22/2010    Procedure: RADIAL ARTERY HARVEST;  Surgeon: Rexene Alberts, MD;  Location: [place];  Service: Open Heart Surgery;  Laterality: Left;  . Coronary angioplasty with stent placement  02/2011  . Coronary artery bypass graft  03/25/1999    CABG x5 using LIMA to LAD, SVG to Diag, SVG to OM, sequential SVG to PD and RPL, open SV harvest from left thigh and leg  . Coronary artery bypass graft  11/22/2010    Procedure: REDO CORONARY ARTERY BYPASS GRAFTING (CABG)TIMES TWO;  Surgeon: Rexene Alberts, MD;  Location: [place];  Service: Open Heart Surgery;  Laterality: N/A;  using endoscopically harvested right saphenous vein and left radial artery; Transesophageal echocardiogram  . Inguinal hernia repair      ? left  . Lithotripsy    .  Left heart catheterization with coronary/graft angiogram N/A 03/01/2011    Procedure: LEFT HEART CATHETERIZATION WITH Beatrix Fetters;  Surgeon: Jettie Booze, MD;  [facility] C[facility] LAB;  Service: Cardiovascular;  Laterality: N/A;  . Percutaneous coronary stent intervention (pci-s) N/A 03/02/2011    Procedure: PERCUTANEOUS CORONARY STENT INTERVENTION (PCI-S);  Surgeon: Jettie Booze, MD;  Location: [facility] C[facility] LAB;  Service: Cardiovascular;  Laterality: N/A;  . Left heart catheterization with coronary/graft angiogram N/A 05/19/2011    Procedure: LEFT HEART CATHETERIZATION WITH Beatrix Fetters;  Surgeon: Jettie Booze, MD;  Location: [facility] CATH LAB;  Service: Cardiovascular;  Laterality: N/A;  . Percutaneous coronary stent intervention (pci-s)  05/19/2011    Procedure: PERCUTANEOUS CORONARY STENT INTERVENTION (PCI-S);  Surgeon: Jettie Booze, MD;  Location: [facility] CATH LAB;  Service:  Cardiovascular;;   Family History  Problem Relation Age of Onset  . Cancer Father   . Cancer Brother   . Stroke Brother    Social History  Substance Use Topics  . Smoking status: Former Smoker -- 0.25 packs/day for 10 years    Types: Cigarettes    Quit date: 11/20/1960  . Smokeless tobacco: Never Used  . Alcohol Use: No    Review of Systems  Constitutional: Positive for chills. Negative for activity change and appetite change.  Eyes: Negative for pain.  Respiratory: Negative for chest tightness and shortness of breath.   Cardiovascular: Negative for chest pain and leg swelling.  Gastrointestinal: Positive for nausea, vomiting and abdominal pain. Negative for diarrhea.  Genitourinary: Negative for dysuria, flank pain, difficulty urinating and testicular pain.  Musculoskeletal: Negative for back pain and neck stiffness.  Skin: Negative for rash.  Neurological: Negative for weakness, numbness and headaches.  Psychiatric/Behavioral: Negative for behavioral problems.      Allergies  Review of patient's allergies indicates no known allergies.  Home Medications   Prior to Admission medications   Medication Sig Start Date End Date Taking? Authorizing Provider  atorvastatin (LIPITOR) 80 MG tablet Take 80 mg by mouth at bedtime.     Historical Provider, MD  cephALEXin (KEFLEX) 500 MG capsule Take 1 capsule (500 mg total) by mouth 2 (two) times daily. 05/09/15   Davonna Belling, MD  colchicine 0.6 MG tablet Take 1 tablet (0.6 mg total) by mouth daily. 05/19/11 05/18/12  Jacolyn Reedy, MD  folic acid (FOLVITE) 1 MG tablet Take 1 mg by mouth daily.      Historical Provider, MD  HYDROcodone-acetaminophen (NORCO/VICODIN) 5-325 MG tablet Take 0.5-1 tablets by mouth every 6 (six) hours as needed. 05/09/15   Davonna Belling, MD  metoprolol (TOPROL-XL) 100 MG 24 hr tablet Take 50 mg by mouth daily.      Historical Provider, MD  nitroGLYCERIN (NITROSTAT) 0.4 MG SL tablet Place 0.4 mg under  the tongue every 5 (five) minutes as needed. For chest pain    Historical Provider, MD   BP 115/81 mmHg  Pulse 118  Temp(Src) 98.8 F (37.1 C) (Oral)  Resp 18  Ht '5\' 8"'$  (1.727 m)  Wt 143 lb 7 oz (65.063 kg)  BMI 21.81 kg/m2  SpO2 98% Physical Exam  Constitutional: He appears well-developed.  HENT:  Head: Atraumatic.  Cardiovascular: Regular rhythm.   Pulmonary/Chest: Effort normal.  Abdominal: Soft.  Mild right lower quadrant tenderness without rebound or guarding.  Genitourinary:  No testicular mass or hernia palpated. No testicular tenderness.  Musculoskeletal: Normal range of motion.  Neurological: He is alert.  Skin: Skin is warm.  ED Course  Procedures (including critical care time) Labs Review Labs Reviewed  BASIC METABOLIC PANEL - Abnormal; Notable for the following:    Sodium 134 (*)    CO2 20 (*)    Glucose, Bld 138 (*)    BUN 27 (*)    Creatinine, Ser 1.76 (*)    Calcium 8.7 (*)    GFR calc non Af Amer 33 (*)    GFR calc Af Amer 38 (*)    All other components within normal limits  CBC WITH DIFFERENTIAL/PLATELET - Abnormal; Notable for the following:    WBC 11.2 (*)    Platelets 145 (*)    Neutro Abs 10.7 (*)    Lymphs Abs 0.1 (*)    All other components within normal limits  URINALYSIS, ROUTINE W REFLEX MICROSCOPIC (NOT AT [facility]) - Abnormal; Notable for the following:    APPearance CLOUDY (*)    Hgb urine dipstick MODERATE (*)    Leukocytes, UA LARGE (*)    All other components within normal limits  URINE MICROSCOPIC-ADD ON - Abnormal; Notable for the following:    Squamous Epithelial / LPF 0-5 (*)    Bacteria, UA FEW (*)    All other components within normal limits  URINE CULTURE    Imaging Review Ct Abdomen Pelvis Wo Contrast  05/09/2015  CLINICAL DATA:  Right lower quadrant pain, history of renal stones. Prior appendectomy. EXAM: CT ABDOMEN AND PELVIS WITHOUT CONTRAST TECHNIQUE: Multidetector CT imaging of the abdomen and pelvis was performed  following the standard protocol without IV contrast. COMPARISON:  None. FINDINGS: Lower chest: 14 x 13 x 13 mm nodular opacity in the medial left lower lobe (series 4/image 6). Hepatobiliary: Unenhanced liver is unremarkable. Status post cholecystectomy. No intrahepatic or extrahepatic ductal dilatation. Pancreas: Within normal limits. Spleen: Within normal limits. Adrenals/Urinary Tract: Adrenal glands are within normal limits. 3.8 cm medial left upper pole renal cyst (series 2/image 20). Bilateral nonobstructing renal calculi, measuring up to 12 mm in the right lower pole (series 2/ image 38) and 17 mm in the left lower pole (series 2/image 35). Mild right hydroureteronephrosis. Associated 5 mm distal right ureteral calculus just above the UVJ (series 2/image 39). Additional 5 mm distal right ureteral calculus at the UVJ (series 2/ image 81). Bladder is within normal limits. Stomach/Bowel: Stomach is within normal limits. No evidence of bowel obstruction. Appendix is not discretely visualized and is reportedly surgically absent. Sigmoid diverticulosis, without evidence of diverticulitis. Vascular/Lymphatic: Atherosclerotic calcifications of the abdominal aorta and branch vessels. No evidence of abdominal aortic aneurysm. Reproductive: Prostate is notable for dystrophic calcifications. Other: No abdominopelvic ascites. Musculoskeletal: Degenerative changes of the visualized thoracolumbar spine. IMPRESSION: Two 5 mm distal right ureteral calculi at/just above the UVJ. Associated mild right hydroureteronephrosis. Additional nonobstructing bilateral renal calculi measuring up to 12 mm on the right and 17 mm on the left. 14 mm medial left lower lobe pulmonary nodule. Dedicated CT chest is suggested for initial evaluation. Additional ancillary findings as above. Electronically Signed   By: Julian Hy M.D.   On: 05/09/2015 13:36   I have personally reviewed and evaluated these images and lab results as part of  my medical decision-making.   EKG Interpretation None      MDM   Final diagnoses:  Right ureteral stone  Renal insufficiency    Patient with  Lower abdominal pain. Found to have ureteral stone on right. Has renal insufficiency but unsure time. No fever here. Patient has previous history  kidney stones and has a urologist. Urine analysis did show few bacteria and some white cells. This point of giving antibiotics but I'm not convinced this is a urinary tract infection. Urine cultures been sent.   I had discussion with patient and his son about need for follow-up and if he develops  Fever or worsening of symptoms will likely require more urgent intervention. At this point I did not think it is  A severe infected obstructed stone. Patient is eager to go home and I will discharge him.    Davonna Belling, MD 05/09/15 [phone]

## 2015-05-09 NOTE — Discharge Instructions (Signed)
Follow-up with the urologist.  Call him tomorrow. Return immediately for fevers or worsening symptoms.  Kidney Stones Kidney stones (urolithiasis) are deposits that form inside your kidneys. The intense pain is caused by the stone moving through the urinary tract. When the stone moves, the ureter goes into spasm around the stone. The stone is usually passed in the urine.  CAUSES   A disorder that makes certain neck glands produce too much parathyroid hormone (primary hyperparathyroidism).  A buildup of uric acid crystals, similar to gout in your joints.  Narrowing (stricture) of the ureter.  A kidney obstruction present at birth (congenital obstruction).  Previous surgery on the kidney or ureters.  Numerous kidney infections. SYMPTOMS   Feeling sick to your stomach (nauseous).  Throwing up (vomiting).  Blood in the urine (hematuria).  Pain that usually spreads (radiates) to the groin.  Frequency or urgency of urination. DIAGNOSIS   Taking a history and physical exam.  Blood or urine tests.  CT scan.  Occasionally, an examination of the inside of the urinary bladder (cystoscopy) is performed. TREATMENT   Observation.  Increasing your fluid intake.  Extracorporeal shock wave lithotripsy--This is a noninvasive procedure that uses shock waves to break up kidney stones.  Surgery may be needed if you have severe pain or persistent obstruction. There are various surgical procedures. Most of the procedures are performed with the use of small instruments. Only small incisions are needed to accommodate these instruments, so recovery time is minimized. The size, location, and chemical composition are all important variables that will determine the proper choice of action for you. Talk to your health care provider to better understand your situation so that you will minimize the risk of injury to yourself and your kidney.  HOME CARE INSTRUCTIONS   Drink enough water and fluids  to keep your urine clear or pale yellow. This will help you to pass the stone or stone fragments.  Strain all urine through the provided strainer. Keep all particulate matter and stones for your health care provider to see. The stone causing the pain may be as small as a grain of salt. It is very important to use the strainer each and every time you pass your urine. The collection of your stone will allow your health care provider to analyze it and verify that a stone has actually passed. The stone analysis will often identify what you can do to reduce the incidence of recurrences.  Only take over-the-counter or prescription medicines for pain, discomfort, or fever as directed by your health care provider.  Keep all follow-up visits as told by your health care provider. This is important.  Get follow-up X-rays if required. The absence of pain does not always mean that the stone has passed. It may have only stopped moving. If the urine remains completely obstructed, it can cause loss of kidney function or even complete destruction of the kidney. It is your responsibility to make sure X-rays and follow-ups are completed. Ultrasounds of the kidney can show blockages and the status of the kidney. Ultrasounds are not associated with any radiation and can be performed easily in a matter of minutes.  Make changes to your daily diet as told by your health care provider. You may be told to:  Limit the amount of salt that you eat.  Eat 5 or more servings of fruits and vegetables each day.  Limit the amount of meat, poultry, fish, and eggs that you eat.  Collect a 24-hour urine sample  as told by your health care provider.You may need to collect another urine sample every 6-12 months. SEEK MEDICAL CARE IF:  You experience pain that is progressive and unresponsive to any pain medicine you have been prescribed. SEEK IMMEDIATE MEDICAL CARE IF:   Pain cannot be controlled with the prescribed  medicine.  You have a fever or shaking chills.  The severity or intensity of pain increases over 18 hours and is not relieved by pain medicine.  You develop a new onset of abdominal pain.  You feel faint or pass out.  You are unable to urinate.   This information is not intended to replace advice given to you by your health care provider. Make sure you discuss any questions you have with your health care provider.   Document Released: 01/02/2005 Document Revised: 09/23/2014 Document Reviewed: 06/05/2012 Elsevier Interactive Patient Education [employer].

## 2015-05-09 NOTE — ED Notes (Signed)
MD at bedside. 

## 2015-05-09 NOTE — ED Notes (Signed)
Patient c/o R side groin pain. He states that he had hernia surgery several years ago. Had chills last night & decreased appetite due to pain. He was clearing tree branches this weekend.

## 2015-05-09 NOTE — ED Notes (Signed)
Denies any pain in scrotal area.

## 2015-05-09 NOTE — ED Notes (Signed)
Pt presents with groin pain, onset last PM, states had hernia surgery "many" years ago. States has been doing yard work. Describes pain as sharp/ burning. Had some nausea with pain, denies vomiting or diarrhea. No difficulty with urination. Able to move RLE without difficulty or increase in pain.

## 2015-05-12 LAB — URINE CULTURE: Culture: 30000 — AB

## 2015-05-13 ENCOUNTER — Telehealth: Payer: Self-pay | Admitting: *Deleted

## 2015-05-13 NOTE — ED Notes (Unsigned)
Post ED Visit - Positive Culture Follow-up: Successful Patient Follow-Up  Culture assessed and recommendations reviewed by: '[]'$  Elenor Quinones, Pharm.D. '[]'$  Heide Guile, Pharm.D., BCPS '[]'$  Parks Neptune, Pharm.D. '[]'$  Alycia Rossetti, Pharm.D., BCPS '[]'$  [place], [place].D., BCPS, AAHIVP '[]'$  Legrand Como, Pharm.D., BCPS, AAHIVP '[]'$  Milus Glazier, Pharm.D. '[]'$  Stephens November, Pharm.D.  Positive *** culture  '[]'$  Patient discharged without antimicrobial prescription and treatment is now indicated '[x]'$  Organism is resistant to prescribed ED discharge antimicrobial '[]'$  Patient with positive blood cultures  Changes discussed with ED provider:Jessica Focht, PA-C Stop Cephalexin, no further treatment needed   Contacted patient, date 05/13/2015, time [place], [place] 05/13/2015, 10:15 AM

## 2015-05-13 NOTE — Progress Notes (Signed)
ED Antimicrobial Stewardship Positive Culture Follow Up   Jacob Hampton is an 82 y.o. male who presented to [facility] on 05/09/2015 with a chief complaint of  Chief Complaint  Patient presents with  . Groin Pain    Recent Results (from the past 720 hour(s))  Urine culture     Status: Abnormal   Collection Time: 05/09/15  2:19 PM  Result Value Ref Range Status   Specimen Description URINE, CLEAN CATCH  Final   Special Requests NONE  Final   Culture 30,000 COLONIES/mL ENTEROCOCCUS SPECIES (A)  Final   Report Status 05/12/2015 FINAL  Final   Organism ID, Bacteria ENTEROCOCCUS SPECIES (A)  Final      Susceptibility   Enterococcus species - MIC*    AMPICILLIN <=2 SENSITIVE Sensitive     LEVOFLOXACIN 0.5 SENSITIVE Sensitive     NITROFURANTOIN <=16 SENSITIVE Sensitive     VANCOMYCIN 2 SENSITIVE Sensitive     * 30,000 COLONIES/mL ENTEROCOCCUS SPECIES    '[x]'$  Treated with Keflex, organism resistant to prescribed antimicrobial '[]'$  Patient discharged originally without antimicrobial agent and treatment is now indicated  Patient c/o R sided groin pain and has no difficulty with urination, and no dysuria. Patient found to have urethral Jacob.  Patient will be informed to stop Keflex. No further treatment is indicated for his asymptomatic bacteriuria at this time.   ED Provider: Jackson Latino, PA-C   Liliane Shi 05/13/2015, 9:03 AM PharmD Candidate

## 2015-05-14 ENCOUNTER — Other Ambulatory Visit: Payer: Self-pay | Admitting: Cardiology

## 2015-05-14 ENCOUNTER — Ambulatory Visit
Admission: RE | Admit: 2015-05-14 | Discharge: 2015-05-14 | Disposition: A | Discharge status: Home / Self Care | Payer: Medicare Other | Source: Ambulatory Visit | Attending: Cardiology | Admitting: Cardiology

## 2015-05-14 DIAGNOSIS — R079 Chest pain, unspecified: Secondary | ICD-10-CM

## 2015-05-20 ENCOUNTER — Ambulatory Visit (INDEPENDENT_AMBULATORY_CARE_PROVIDER_SITE_OTHER): Payer: Medicare Other | Admitting: Internal Medicine

## 2015-05-20 ENCOUNTER — Encounter: Payer: Self-pay | Admitting: Internal Medicine

## 2015-05-20 VITALS — BP 90/60 | HR 72 | Ht 65.0 in | Wt 142.2 lb

## 2015-05-20 DIAGNOSIS — R911 Solitary pulmonary nodule: Secondary | ICD-10-CM

## 2015-05-20 HISTORY — DX: Solitary pulmonary nodule: R91.1

## 2015-05-20 NOTE — Progress Notes (Signed)
Subjective:     Patient ID: Jacob Hampton, male   DOB: [birth year],     MRN: [id number]  HPI  7 yowm quit smoking in 1977  referred to pulmonary clinic 05/20/2015 by Dr Wynonia Lawman for abn cxr   05/20/2015 1st [place] [facility] office visit/ Mana Morison   Chief Complaint  Patient presents with  . Pulmonary Consult    Referred by Dr. Tollie Eth for eval of abnormal cxr. Pt c/o DOE with minimal exertion and also unintended weight loss over the past few months.   doe x one month indolent onset/ progressive = MMRC3 = can't walk 100 yards even at a slow pace at a flat grade s stopping due to sob with gen weakness and wt loss - was having non-pleuritic L cp but this has resolved/ appetite poor s GI symptoms though has been having renal colic related to stones under eval by urology   No obvious day to day or daytime variability or assoc excess/ purulent sputum or mucus plugs or hemoptysis or  chest tightness, subjective wheeze or overt sinus or hb symptoms. No unusual exp hx or h/o childhood pna/ asthma or knowledge of premature birth.  Sleeping ok without nocturnal  or early am exacerbation  of respiratory  c/o's or need for noct saba. Also denies any obvious fluctuation of symptoms with weather or environmental changes or other aggravating or alleviating factors except as outlined above   Current Medications, Allergies, Complete Past Medical History, Past Surgical History, Family History, and Social History were reviewed in [employer] record.  ROS  The following are not active complaints unless bolded sore throat, dysphagia, dental problems, itching, sneezing,  nasal congestion or excess/ purulent secretions, ear ache,   fever, chills, sweats, unintended wt loss, classically pleuritic or exertional cp,  orthopnea pnd or leg swelling, presyncope, palpitations, abdominal pain resolved c/w renal colic , anorexia, nausea, vomiting, diarrhea  or change in bowel or bladder habits, change in  stools or urine, dysuria,hematuria,  rash, arthralgias, visual complaints, headache, numbness, weakness or ataxia or problems with walking or coordination,  change in mood/affect or memory.             Review of Systems     Objective:   Physical Exam    amb frail wm stooped over nad  Wt Readings from Last 3 Encounters:  05/20/15 142 lb 3.2 oz (64.501 kg)  05/09/15 143 lb 7 oz (65.063 kg)  05/20/11 156 lb 4.9 oz (70.9 kg)    Vital signs reviewed   HEENT: nl dentition, turbinates, and oropharynx. Nl external ear canals without cough reflex   NECK :  without JVD/Nodes/TM/ nl carotid upstrokes bilaterally   LUNGS: no acc muscle use,  Nl contour chest which is clear to A and P bilaterally without cough on insp or exp maneuvers   CV:  RRR  no s3 or murmur or increase in P2, no edema   ABD:  soft and nontender with nl inspiratory excursion in the supine position. No bruits or organomegaly, bowel sounds nl  MS:  Nl gait/ ext warm without deformities, calf tenderness, cyanosis or clubbing No obvious joint restrictions   SKIN: warm and dry without lesions    NEURO:  alert, approp, nl sensorium with  no motor deficits      I personally reviewed images and agree with radiology impression as follows:  CT Chest   05/14/15  Centrilobular emphysema. Spiculated left lower lobe pulmonary nodule, highly suspicious for primary bronchogenic  carcinoma. No thoracic adenopathy     Assessment:

## 2015-05-20 NOTE — Patient Instructions (Signed)
Please see patient coordinator before you leave today  to schedule PET Scan then I will call with recommendations.

## 2015-05-20 NOTE — Progress Notes (Signed)
   Subjective:    Patient ID: Jacob Hampton, male    DOB: Apr 02, 1924, 82 y.o.   MRN: [id number]  HPI    Review of Systems  Constitutional: Positive for appetite change and unexpected weight change. Negative for fever, chills and activity change.  HENT: Positive for trouble swallowing. Negative for congestion, dental problem, postnasal drip, rhinorrhea, sneezing, sore throat and voice change.   Eyes: Negative for visual disturbance.  Respiratory: Positive for shortness of breath. Negative for cough and choking.   Cardiovascular: Negative for chest pain and leg swelling.  Gastrointestinal: Positive for abdominal pain. Negative for nausea and vomiting.  Genitourinary: Negative for difficulty urinating.  Musculoskeletal: Negative for arthralgias.  Skin: Negative for rash.  Psychiatric/Behavioral: Negative for behavioral problems and confusion.       Objective:   Physical Exam        Assessment & Plan:

## 2015-05-21 ENCOUNTER — Encounter: Payer: Self-pay | Admitting: Internal Medicine

## 2015-05-21 NOTE — Assessment & Plan Note (Addendum)
This lesion can 't be seen on plain cxr so no telling how long it's been there or how fast it's growing at this point or it's potential impact on this pt in terms of morbidity/mortality if left alone which is what he/fm are leaning towards.  Discussed in detail all the  indications, usual  risks and alternatives  relative to the benefits with patient and son/daughter who agree  to proceed with PET scan to see if this nodule is "the tip of the iceberg" ie some primary maligancy that's contributing to his symptoms or just an isolated tumor that they would rather leave alone and follow over time until / unless could be contributing to any of his symptoms.  Very unlikely he could tol RT if already sob since this is the LLL, the second largest component of his ventilatory capacity after the RLL .   Total time devoted to counseling  = 35/14mreview case with pt/ discussion of options/alternatives/ personally creating in presence of pt  then going over specific  Instructions directly with the pt including how to use all of the meds but in particular covering each new medication in detail (see avs)

## 2015-05-27 ENCOUNTER — Telehealth: Payer: Self-pay | Admitting: Internal Medicine

## 2015-05-27 ENCOUNTER — Ambulatory Visit (HOSPITAL_COMMUNITY)
Admission: RE | Admit: 2015-05-27 | Discharge: 2015-05-27 | Disposition: A | Discharge status: Home / Self Care | Payer: Medicare Other | Source: Ambulatory Visit | Attending: Internal Medicine | Admitting: Internal Medicine

## 2015-05-27 DIAGNOSIS — R911 Solitary pulmonary nodule: Secondary | ICD-10-CM | POA: Insufficient documentation

## 2015-05-27 LAB — GLUCOSE, CAPILLARY: Glucose-Capillary: 87 mg/dL (ref 65–99)

## 2015-05-27 MED ORDER — FLUDEOXYGLUCOSE F - 18 (FDG) INJECTION
7.0000 | Freq: Once | INTRAVENOUS | Status: AC | PRN
  Administered 2015-05-27: 7 via INTRAVENOUS

## 2015-05-27 NOTE — Telephone Encounter (Signed)
Received a call report from [facility] regarding PET Scan.  Please advise Dr Melvyn Novas. Thanks.

## 2015-05-31 NOTE — Telephone Encounter (Signed)
See result note.  

## 2015-06-08 DIAGNOSIS — N201 Calculus of ureter: Secondary | ICD-10-CM | POA: Insufficient documentation

## 2015-06-08 DIAGNOSIS — N200 Calculus of kidney: Secondary | ICD-10-CM

## 2015-06-08 HISTORY — DX: Calculus of kidney: N20.0

## 2015-06-24 NOTE — Progress Notes (Signed)
Quick Note:  Spoke with pt and notified of results per Dr. Wert. Pt verbalized understanding and denied any questions.  ______ 

## 2015-07-05 ENCOUNTER — Ambulatory Visit (INDEPENDENT_AMBULATORY_CARE_PROVIDER_SITE_OTHER): Payer: Medicare Other | Admitting: Internal Medicine

## 2015-07-05 ENCOUNTER — Encounter: Payer: Self-pay | Admitting: Internal Medicine

## 2015-07-05 ENCOUNTER — Ambulatory Visit (INDEPENDENT_AMBULATORY_CARE_PROVIDER_SITE_OTHER)
Admission: RE | Admit: 2015-07-05 | Discharge: 2015-07-05 | Disposition: A | Discharge status: Home / Self Care | Payer: Medicare Other | Source: Ambulatory Visit | Attending: Internal Medicine | Admitting: Internal Medicine

## 2015-07-05 VITALS — BP 118/70 | HR 73 | Ht 65.0 in | Wt 139.0 lb

## 2015-07-05 DIAGNOSIS — R911 Solitary pulmonary nodule: Secondary | ICD-10-CM

## 2015-07-05 DIAGNOSIS — R0600 Dyspnea, unspecified: Secondary | ICD-10-CM

## 2015-07-05 DIAGNOSIS — J449 Chronic obstructive pulmonary disease, unspecified: Secondary | ICD-10-CM | POA: Diagnosis not present

## 2015-07-05 HISTORY — DX: Chronic obstructive pulmonary disease, unspecified: J44.9

## 2015-07-05 NOTE — Progress Notes (Signed)
Subjective:    Patient ID: Jacob Hampton, male   DOB: 06/27/1924    MRN: [id number]    Brief patient profile:  30 yowm quit smoking in 1977  referred to pulmonary clinic 05/20/2015 by Dr Wynonia Lawman for abn cxr    History of Present Illness  05/20/2015 1st [place] [facility] office visit/ Wert   Chief Complaint  Patient presents with  . Pulmonary Consult    Referred by Dr. Tollie Eth for eval of abnormal cxr. Pt c/o DOE with minimal exertion and also unintended weight loss over the past few months.   doe x one month indolent onset/ progressive = MMRC3 = can't walk 100 yards even at a slow pace at a flat grade s stopping due to sob with gen weakness and wt loss - was having non-pleuritic L cp but this has resolved/ appetite poor s GI symptoms though has been having renal colic related to stones under eval by urology  rec PET done 05/27/15 isolated dz RLL    07/05/2015  Extended f/u ov/Wert re:  Spn/ doe - all fm present = 2 daughters/ one son Chief Complaint  Patient presents with  . Follow-up    Review PET scan today.   used to walk mb and back up incline to house ok s sob but now def sob and stops when gets to house.  No obvious day to day or daytime variability or assoc excess/ purulent sputum or mucus plugs or hemoptysis or  chest tightness, subjective wheeze or overt sinus or hb symptoms. No unusual exp hx or h/o childhood pna/ asthma or knowledge of premature birth.  Sleeping ok without nocturnal  or early am exacerbation  of respiratory  c/o's or need for noct saba. Also denies any obvious fluctuation of symptoms with weather or environmental changes or other aggravating or alleviating factors except as outlined above   Current Medications, Allergies, Complete Past Medical History, Past Surgical History, Family History, and Social History were reviewed in [employer] record.  ROS  The following are not active complaints unless bolded sore throat, dysphagia, dental  problems, itching, sneezing,  nasal congestion or excess/ purulent secretions, ear ache,   fever, chills, sweats, unintended wt loss, classically pleuritic or exertional cp,  orthopnea pnd or leg swelling, presyncope, palpitations, abdominal pain resolved c/w renal colic , anorexia, nausea, vomiting, diarrhea  or change in bowel or bladder habits, change in stools or urine, dysuria,hematuria,  rash, arthralgias, visual complaints, headache, numbness, weakness or ataxia or problems with walking or coordination,  change in mood/affect or memory.                 Objective:   Physical Exam    amb frail wm stooped over nad  07/05/2015       139   05/20/15 142 lb 3.2 oz (64.501 kg)  05/09/15 143 lb 7 oz (65.063 kg)  05/20/11 156 lb 4.9 oz (70.9 kg)    Vital signs reviewed   HEENT: edentulous/ nl  turbinates, and oropharynx. Nl external ear canals without cough reflex   NECK :  without JVD/Nodes/TM/ nl carotid upstrokes bilaterally   LUNGS: no acc muscle use,  Nl contour chest which is clear to A and P bilaterally without cough on insp or exp maneuvers   CV:  RRR  no s3 or murmur or increase in P2, no edema   ABD:  soft and nontender with nl inspiratory excursion in the supine position. No bruits or organomegaly, bowel  sounds nl  MS:  Nl gait/ ext warm without deformities, calf tenderness, cyanosis or clubbing No obvious joint restrictions   SKIN: warm and dry without lesions    NEURO:  alert, approp, nl sensorium with  no motor deficits      CXR PA and Lateral:   07/05/2015 :    I personally reviewed images and agree with radiology impression as follows:   No pulmonary parenchymal nodules or masses are observed. A known left lower lobe mass demonstrated on previous CT studies is not evident on this plain film. There are chronic changes involving the left lateral costophrenic gutter and left lung. Stable post CABG changes.      Assessment:

## 2015-07-05 NOTE — Patient Instructions (Signed)
Please remember to go to the x-ray department downstairs for your tests - we will call you with the results when they are available.     Please schedule a follow up visit in 3 months but call sooner if needed (new cough/ chest pain on Left with breathing) with cxr on return

## 2015-07-05 NOTE — Progress Notes (Signed)
Quick Note:  LMTCB for Debbie, pt's daughter ______

## 2015-07-05 NOTE — Progress Notes (Signed)
Quick Note:  Pt's daughter returned call. Advised of cxr results / recs as stated by MW. Debbie voiced her understanding and denied any questions or concerns. ______

## 2015-07-06 ENCOUNTER — Encounter: Payer: Self-pay | Admitting: Internal Medicine

## 2015-07-06 NOTE — Assessment & Plan Note (Signed)
Incidentally detected 05/09/15 x LLL medially x 62m  PET 05/27/2015 >  1. Hypermetabolic nodule in the LEFT lower lobe consistent with primary bronchogenic carcinoma. 2. No evidence mediastinal nodal metastasis or distant metastasis  - see ov 07/05/2015 > declined surgical opinion   Discussed in detail all the  indications, usual  risks and alternatives  relative to the benefits with patient and fm  who all strongly favor  to proceed with conservative f/u instead of surgical opinion at age [age]with sob mb to house.  I had an extended discussion with the patient reviewing all relevant studies completed to date and  lasting 25 minutes of a 40  minute extended office visit    Each maintenance medication was reviewed in detail including most importantly the difference between maintenance and prns and under what circumstances the prns are to be triggered using an action plan format that is not reflected in the computer generated alphabetically organized AVS.    Please see instructions for details which were reviewed in writing and the patient given a copy highlighting the part that I personally wrote and discussed at today's ov.

## 2015-07-06 NOTE — Assessment & Plan Note (Signed)
Spirometry 07/05/2015  FEV1 1.61 (86%)  Ratio 60    Discussed importance of the LLL and risk of worse sob with any form of surgery and really not a candidate for LLLobectomy by could tol palliative RT if needed.

## 2015-10-05 ENCOUNTER — Encounter: Payer: Self-pay | Admitting: Internal Medicine

## 2015-10-05 ENCOUNTER — Ambulatory Visit (INDEPENDENT_AMBULATORY_CARE_PROVIDER_SITE_OTHER): Payer: Medicare Other | Admitting: Internal Medicine

## 2015-10-05 ENCOUNTER — Ambulatory Visit (INDEPENDENT_AMBULATORY_CARE_PROVIDER_SITE_OTHER)
Admission: RE | Admit: 2015-10-05 | Discharge: 2015-10-05 | Disposition: A | Discharge status: Home / Self Care | Payer: Medicare Other | Source: Ambulatory Visit | Attending: Internal Medicine | Admitting: Internal Medicine

## 2015-10-05 VITALS — BP 108/60 | HR 77 | Ht 66.0 in | Wt 140.0 lb

## 2015-10-05 DIAGNOSIS — J449 Chronic obstructive pulmonary disease, unspecified: Secondary | ICD-10-CM

## 2015-10-05 DIAGNOSIS — R911 Solitary pulmonary nodule: Secondary | ICD-10-CM

## 2015-10-05 NOTE — Progress Notes (Signed)
Subjective:    Patient ID: Jacob Hampton, male   DOB: [birth year]    MRN: [id number]    Brief patient profile:  48 yowm quit smoking in 1977  referred to pulmonary clinic 05/20/2015 by Dr Wynonia Lawman for abn cxr    History of Present Illness  05/20/2015 1st [place] [facility] office visit/ Brytney Somes   Chief Complaint  Patient presents with  . Pulmonary Consult    Referred by Dr. Tollie Eth for eval of abnormal cxr. Pt c/o DOE with minimal exertion and also unintended weight loss over the past few months.   doe x one month indolent onset/ progressive = MMRC3 = can't walk 100 yards even at a slow pace at a flat grade s stopping due to sob with gen weakness and wt loss - was having non-pleuritic L cp but this has resolved/ appetite poor s GI symptoms though has been having renal colic related to stones under eval by urology  rec PET done 05/27/15 isolated dz RLL    07/05/2015  Extended f/u ov/Genevieve Ritzel re:  Spn/ doe - all fm present = 2 daughters/ one son Chief Complaint  Patient presents with  . Follow-up    Review PET scan today.   used to walk mb and back up incline to house ok s sob but now def sob and stops when gets to house. rec No change rx feasible   10/05/2015  f/u ov/Dedra Matsuo re:  spn/doe Chief Complaint  Patient presents with  . Follow-up    CXR done today. No co's today.    more limited by geriactric decline than sob  No obvious day to day or daytime variability or assoc excess/ purulent sputum or mucus plugs or hemoptysis or  chest tightness, subjective wheeze or overt sinus or hb symptoms. No unusual exp hx or h/o childhood pna/ asthma or knowledge of premature birth.  Sleeping ok without nocturnal  or early am exacerbation  of respiratory  c/o's or need for noct saba. Also denies any obvious fluctuation of symptoms with weather or environmental changes or other aggravating or alleviating factors except as outlined above   Current Medications, Allergies, Complete Past Medical History, Past  Surgical History, Family History, and Social History were reviewed in [employer] record.  ROS  The following are not active complaints unless bolded sore throat, dysphagia, dental problems, itching, sneezing,  nasal congestion or excess/ purulent secretions, ear ache,   fever, chills, sweats, unintended wt loss, classically pleuritic or exertional cp,  orthopnea pnd or leg swelling, presyncope, palpitations, abdominal pain resolved c/w renal colic , anorexia, nausea, vomiting, diarrhea  or change in bowel or bladder habits, change in stools or urine, dysuria,hematuria,  rash, arthralgias, visual complaints, headache, numbness, weakness or ataxia or problems with walking or coordination,  change in mood/affect or memory.                 Objective:   Physical Exam    amb frail wm stooped over nad  10/05/2015       140  07/05/2015       139   05/20/15 142 lb 3.2 oz (64.501 kg)  05/09/15 143 lb 7 oz (65.063 kg)  05/20/11 156 lb 4.9 oz (70.9 kg)    Vital signs reviewed   HEENT: edentulous/ nl  turbinates, and oropharynx. Nl external ear canals without cough reflex   NECK :  without JVD/Nodes/TM/ nl carotid upstrokes bilaterally   LUNGS: no acc muscle use,  Nl contour chest  which is clear to A and P bilaterally without cough on insp or exp maneuvers   CV:  RRR  no s3 or murmur or increase in P2, no edema   ABD:  soft and nontender with nl inspiratory excursion in the supine position. No bruits or organomegaly, bowel sounds nl  MS:  Nl gait/ ext warm without deformities, calf tenderness, cyanosis or clubbing No obvious joint restrictions   SKIN: warm and dry without lesions    NEURO:  alert, approp, nl sensorium with  no motor deficits     CXR PA and Lateral:   10/05/2015 :    I personally reviewed images and agree with radiology impression as follows:    Changes from CABG surgery are stable. The cardiac silhouette is normal in size and configuration.  There are no mediastinal or hilar masses or evidence of adenopathy.  Linear opacity at the left lung base, consistent scarring, is stable. There is mild bilateral apical pleural parenchymal scarring, also stable. Lungs are otherwise clear.     Assessment:

## 2015-10-05 NOTE — Patient Instructions (Signed)
Please schedule a follow up visit in 6 months but call sooner if needed  

## 2015-10-10 NOTE — Assessment & Plan Note (Signed)
Incidentally detected 05/09/15 x LLL medially x 39m  PET 05/27/2015 >  1. Hypermetabolic nodule in the LEFT lower lobe consistent with primary bronchogenic carcinoma. 2. No evidence mediastinal nodal metastasis or distant metastasis  - see ov 07/05/2015 > declined surgical opinion  - 10/05/2015 not viz on plain cxr   Discussed in detail all the  indications, usual  risks and alternatives  relative to the benefits with patient who agrees to proceed with conservative f/u as outlined - very strongly does not want anything done in terms of rx other than palliative if/ when this nodule becomes symptomatic  Discussion time 15/25 min viz

## 2015-10-10 NOTE — Assessment & Plan Note (Signed)
Spirometry 07/05/2015  FEV1 1.61 (86%)  Ratio 60    Really more debilitated than sob limited > no indication for rx at this point

## 2016-04-05 ENCOUNTER — Ambulatory Visit (INDEPENDENT_AMBULATORY_CARE_PROVIDER_SITE_OTHER): Payer: Medicare Other | Admitting: Internal Medicine

## 2016-04-05 ENCOUNTER — Ambulatory Visit: Payer: Medicare Other | Admitting: Internal Medicine

## 2016-04-05 ENCOUNTER — Encounter: Payer: Self-pay | Admitting: Internal Medicine

## 2016-04-05 ENCOUNTER — Ambulatory Visit (INDEPENDENT_AMBULATORY_CARE_PROVIDER_SITE_OTHER)
Admission: RE | Admit: 2016-04-05 | Discharge: 2016-04-05 | Disposition: A | Discharge status: Home / Self Care | Payer: Medicare Other | Source: Ambulatory Visit | Attending: Internal Medicine | Admitting: Internal Medicine

## 2016-04-05 VITALS — BP 118/64 | HR 76 | Ht 66.0 in | Wt 145.0 lb

## 2016-04-05 DIAGNOSIS — R911 Solitary pulmonary nodule: Secondary | ICD-10-CM | POA: Diagnosis not present

## 2016-04-05 MED ORDER — HYDROCODONE-ACETAMINOPHEN 5-325 MG PO TABS: 1.0000 | ORAL_TABLET | Freq: Four times a day (QID) | ORAL | 0 refills | Status: DC | PRN

## 2016-04-05 NOTE — Progress Notes (Signed)
Subjective:    Patient ID: Jacob Hampton, male   DOB: 1924/08/10    MRN: [id number]    Brief patient profile:  89 yowm quit smoking in 1977  referred to pulmonary clinic 05/20/2015 by Dr Wynonia Lawman for abn cxr    History of Present Illness  05/20/2015 1st [facility] office visit/ Jacob Hampton   Chief Complaint  Patient presents with  . Pulmonary Consult    Referred by Dr. Tollie Eth for eval of abnormal cxr. Pt c/o DOE with minimal exertion and also unintended weight loss over the past few months.   doe x one month indolent onset/ progressive = MMRC3 = can't walk 100 yards even at a slow pace at a flat grade s stopping due to sob with gen weakness and wt loss - was having non-pleuritic L cp but this has resolved/ appetite poor s GI symptoms though has been having renal colic related to stones under eval by urology  rec PET done 05/27/15 isolated dz LLL    07/05/2015  Extended f/u ov/Jacob Hampton re:  Spn/ doe - all fm present = 2 daughters/ one son Chief Complaint  Patient presents with  . Follow-up    Review PET scan today.   used to walk mb and back up incline to house ok s sob but now def sob and stops when gets to house. rec No change rx feasible   04/05/2016  f/u ov/Jacob Hampton re:  spn LLL sup segment medially  Chief Complaint  Patient presents with  . Follow-up    Pt states overall doing well besides some left shoulder pain that started 2 wks ago.     ? Acute onset L post cp 24/7 no fluctuation/  not pleuritic/positional and no rash or problem using L arm   No obvious day to day or daytime variability or assoc increase sob  excess/ purulent sputum or mucus plugs or hemoptysis or   chest tightness, subjective wheeze or overt sinus or hb symptoms. No unusual exp hx or h/o childhood pna/ asthma or knowledge of premature birth.  Sleeping ok without nocturnal  or early am exacerbation  of respiratory  c/o's or need for noct saba. Also denies any obvious fluctuation of symptoms with weather or  environmental changes or other aggravating or alleviating factors except as outlined above   Current Medications, Allergies, Complete Past Medical History, Past Surgical History, Family History, and Social History were reviewed in [employer] record.  ROS  The following are not active complaints unless bolded sore throat, dysphagia, dental problems, itching, sneezing,  nasal congestion or excess/ purulent secretions, ear ache,   fever, chills, sweats, unintended wt loss, classically pleuritic or exertional cp,  orthopnea pnd or leg swelling, presyncope, palpitations, abdominal pain, anorexia, nausea, vomiting, diarrhea  or change in bowel or bladder habits, change in stools or urine, dysuria,hematuria,  rash, arthralgias, visual complaints, headache, numbness, weakness or ataxia or problems with walking or coordination,  change in mood/affect or memory.                       Objective:   Physical Exam    amb frail wm stooped over nad  04/05/2016       145  10/05/2015       140  07/05/2015       139   05/20/15 142 lb 3.2 oz (64.501 kg)  05/09/15 143 lb 7 oz (65.063 kg)  05/20/11 156 lb 4.9 oz (70.9 kg)  Vital signs reviewed  - Note on arrival 02 sats  97% on RA     HEENT: edentulous/ nl  turbinates, and oropharynx. Nl external ear canals without cough reflex   NECK :  without JVD/Nodes/TM/ nl carotid upstrokes bilaterally   LUNGS: no acc muscle use,  Nl contour chest which is clear to A and P bilaterally without cough on insp or exp maneuvers   CV:  RRR  no s3 or murmur or increase in P2, no edema   ABD:  soft and nontender with nl inspiratory excursion in the supine position. No bruits or organomegaly, bowel sounds nl  MS:  Nl gait/ ext warm without deformities, calf tenderness, cyanosis or clubbing No obvious joint restrictions - L shoulder completely nl/ no pain elicited thru full rom and no tenderness/ rash    SKIN: warm and dry without  lesions    NEURO:  alert, approp, nl sensorium with  no motor deficits      CXR PA and Lateral:   04/05/2016 :    I personally reviewed images and agree with radiology impression as follows:    Post CABG. Aortic atherosclerosis. Question COPD changes with LEFT basilar scarring. My impression: lots of gas under L HD but no effusion or abn on cxr corresponding to area of pain       Assessment:

## 2016-04-05 NOTE — Patient Instructions (Signed)
Please remember to go to the  x-ray department downstairs in the basement  for your tests - we will call you with the results when they are available.  For pain tylenol or vicdin (norco) one every 4 hours as needed   Please schedule a follow up visit in 3 months but call sooner if needed

## 2016-04-06 NOTE — Progress Notes (Signed)
Spoke with pt and notified of results per Dr. Wert. Pt verbalized understanding and denied any questions. 

## 2016-04-06 NOTE — Assessment & Plan Note (Signed)
Incidentally detected 05/09/15 x LLL medially x 55m  PET 05/27/2015 >  1. Hypermetabolic nodule in the LEFT lower lobe consistent with primary bronchogenic carcinoma. 2. No evidence mediastinal nodal metastasis or distant metastasis  - see ov 07/05/2015 > declined surgical opinion  - 10/05/2015 not viz on plain cxr > neg cxr 04/05/2016   Discussed in detail all the  indications, usual  risks and alternatives  relative to the benefits with patient who requests only palliative rx - I doubt this nodule is causing pain in the top of his shoulder in the absence of effusion or obvious pleural involvement so rec rx symptomatically for now and if pain continues consider CT   I had an extended discussion with the patient reviewing all relevant studies completed to date and  lasting 15 to 20 minutes of a 25 minute visit    Each maintenance medication was reviewed in detail including most importantly the difference between maintenance and prns and under what circumstances the prns are to be triggered using an action plan format that is not reflected in the computer generated alphabetically organized AVS.    Please see AVS for specific instructions unique to this visit that I personally wrote and verbalized to the the pt in detail and then reviewed with pt  by my nurse highlighting any  changes in therapy recommended at today's visit to their plan of care.

## 2016-05-25 ENCOUNTER — Encounter (HOSPITAL_BASED_OUTPATIENT_CLINIC_OR_DEPARTMENT_OTHER): Payer: Self-pay

## 2016-05-25 ENCOUNTER — Emergency Department (HOSPITAL_BASED_OUTPATIENT_CLINIC_OR_DEPARTMENT_OTHER)
Admission: EM | Admit: 2016-05-25 | Discharge: 2016-05-25 | Disposition: A | Discharge status: Home / Self Care | Payer: Medicare Other | Attending: Emergency Medicine | Admitting: Emergency Medicine

## 2016-05-25 ENCOUNTER — Emergency Department (HOSPITAL_BASED_OUTPATIENT_CLINIC_OR_DEPARTMENT_OTHER): Payer: Medicare Other

## 2016-05-25 DIAGNOSIS — N451 Epididymitis: Secondary | ICD-10-CM | POA: Insufficient documentation

## 2016-05-25 DIAGNOSIS — Z87891 Personal history of nicotine dependence: Secondary | ICD-10-CM | POA: Insufficient documentation

## 2016-05-25 DIAGNOSIS — I2510 Atherosclerotic heart disease of native coronary artery without angina pectoris: Secondary | ICD-10-CM | POA: Diagnosis not present

## 2016-05-25 DIAGNOSIS — Z79899 Other long term (current) drug therapy: Secondary | ICD-10-CM | POA: Diagnosis not present

## 2016-05-25 DIAGNOSIS — J449 Chronic obstructive pulmonary disease, unspecified: Secondary | ICD-10-CM | POA: Diagnosis not present

## 2016-05-25 DIAGNOSIS — I10 Essential (primary) hypertension: Secondary | ICD-10-CM | POA: Insufficient documentation

## 2016-05-25 DIAGNOSIS — N5082 Scrotal pain: Secondary | ICD-10-CM | POA: Diagnosis present

## 2016-05-25 DIAGNOSIS — Z951 Presence of aortocoronary bypass graft: Secondary | ICD-10-CM | POA: Diagnosis not present

## 2016-05-25 DIAGNOSIS — R1030 Lower abdominal pain, unspecified: Secondary | ICD-10-CM

## 2016-05-25 LAB — CBC WITH DIFFERENTIAL/PLATELET
Basophils Absolute: 0 10*3/uL (ref 0.0–0.1)
Basophils Relative: 0 %
EOS ABS: 0.1 10*3/uL (ref 0.0–0.7)
EOS PCT: 2 %
HCT: 37.1 % — ABNORMAL LOW (ref 39.0–52.0)
Hemoglobin: 12.5 g/dL — ABNORMAL LOW (ref 13.0–17.0)
LYMPHS ABS: 1.8 10*3/uL (ref 0.7–4.0)
Lymphocytes Relative: 24 %
MCH: 33.5 pg (ref 26.0–34.0)
MCHC: 33.7 g/dL (ref 30.0–36.0)
MCV: 99.5 fL (ref 78.0–100.0)
MONO ABS: 0.8 10*3/uL (ref 0.1–1.0)
Monocytes Relative: 10 %
Neutro Abs: 4.9 10*3/uL (ref 1.7–7.7)
Neutrophils Relative %: 64 %
PLATELETS: 165 10*3/uL (ref 150–400)
RBC: 3.73 MIL/uL — AB (ref 4.22–5.81)
RDW: 14 % (ref 11.5–15.5)
WBC: 7.6 10*3/uL (ref 4.0–10.5)

## 2016-05-25 LAB — COMPREHENSIVE METABOLIC PANEL
ALT: 15 U/L — AB (ref 17–63)
ANION GAP: 5 (ref 5–15)
AST: 28 U/L (ref 15–41)
Albumin: 3.4 g/dL — ABNORMAL LOW (ref 3.5–5.0)
Alkaline Phosphatase: 75 U/L (ref 38–126)
BUN: 21 mg/dL — ABNORMAL HIGH (ref 6–20)
CHLORIDE: 105 mmol/L (ref 101–111)
CO2: 28 mmol/L (ref 22–32)
Calcium: 8.9 mg/dL (ref 8.9–10.3)
Creatinine, Ser: 1 mg/dL (ref 0.61–1.24)
Glucose, Bld: 89 mg/dL (ref 65–99)
POTASSIUM: 5.1 mmol/L (ref 3.5–5.1)
SODIUM: 138 mmol/L (ref 135–145)
Total Bilirubin: 1.7 mg/dL — ABNORMAL HIGH (ref 0.3–1.2)
Total Protein: 6.8 g/dL (ref 6.5–8.1)

## 2016-05-25 MED ORDER — LEVOFLOXACIN 500 MG PO TABS
500.0000 mg | ORAL_TABLET | Freq: Once | ORAL | Status: AC
  Administered 2016-05-25: 500 mg via ORAL
  Filled 2016-05-25: qty 1

## 2016-05-25 MED ORDER — LEVOFLOXACIN 500 MG PO TABS: 500.0000 mg | ORAL_TABLET | Freq: Every day | ORAL | 0 refills | Status: AC

## 2016-05-25 MED ORDER — HYDROCODONE-ACETAMINOPHEN 5-325 MG PO TABS
1.0000 | ORAL_TABLET | Freq: Once | ORAL | Status: AC
  Administered 2016-05-25: 1 via ORAL
  Filled 2016-05-25: qty 1

## 2016-05-25 NOTE — ED Triage Notes (Signed)
c/o swelling to groin x 3 days-denies injury-NAD-steady gait

## 2016-05-25 NOTE — ED Notes (Signed)
Patient transported to Ultrasound 

## 2016-05-25 NOTE — Discharge Instructions (Addendum)
To treat we are giving you oral antibiotics, continue to take your pain medications as needed, take ibuprofen a nonsteroidal antiinflammatory drugs (NSAIDs) to decrease swelling, local application of ice, and scrotal elevation.  Follow-up as needed if symptoms are worsening

## 2016-05-25 NOTE — ED Provider Notes (Signed)
[facility] Provider Note   CSN: [id number] Arrival date & time: 05/25/16  2108   History   Chief Complaint Chief Complaint  Patient presents with  . Groin Swelling    HPI Jacob Hampton is a 83 y.o. male.  HPI Patient presents to ED with scrotal pain. Patient states that he has had mild swelling for the last couple of days but today it got worse. Complaining of left scrotal pain and swelling. Patient has had surgical removal of his right testicle. Patient states that at rest his pain is 7 out of 10 pain but when he moves pain becomes 10 out of 10. He denies any injuries to his scrotum. Denies fevers. Recent illness. No other associated swelling. He also noticed some increased redness to the scrotum. Scrotum is very tender. Denies any abdominal pain, dysuria.   Past Medical History:  Diagnosis Date  . Anemia   . Angina   . Arthritis   . Blood transfusion    Had 8 units for GI bleed  . CAD (coronary artery disease)    Dr. Wynonia Lawman is his Cardiologist  . Dizzy    "when I take my blood pressure medicine sometimes it makes me a little dizzy"  . GERD (gastroesophageal reflux disease)   . Heart murmur   . History of primary tuberculosis   . History of upper GI bleeding    8 units for GI bleed   . Hyperlipidemia   . Hypertension   . Kidney stones   . Pneumonia    h/o walking pneumonia at least 20 years ago  . Shortness of breath 05/18/11   "sometimes lying down; sometimes w/activity"  . Sinus drainage    11/24/10 "every morning when I get up I have to clear it out"  . Sleep disorder    "trouble staying asleep"  . Tuberculosis 1969   Was in sanitorium for 1 year    Patient Active Problem List   Diagnosis Date Noted  . COPD GOLD I  07/05/2015  . Solitary pulmonary nodule 05/20/2015  . Unstable angina ([place]) 05/18/2011  . History of atrial fibrillation   . History of tuberculosis 11/17/2010  . CAD (coronary artery disease) 11/16/2010  . Hyperlipidemia 11/16/2010   . History of upper GI bleeding 11/16/2010  . History of CABG 11/16/2010    Past Surgical History:  Procedure Laterality Date  . APPENDECTOMY    . bleeding ulcer     w/gallbladder  . CATARACT EXTRACTION W/ INTRAOCULAR LENS IMPLANT     bilaterally  . CHOLECYSTECTOMY     "w/bleeding ulcer OR"  . CORONARY ANGIOPLASTY WITH STENT PLACEMENT  02/2011  . CORONARY ARTERY BYPASS GRAFT  03/25/1999   CABG x5 using LIMA to LAD, SVG to Diag, SVG to OM, sequential SVG to PD and RPL, open SV harvest from left thigh and leg  . CORONARY ARTERY BYPASS GRAFT  11/22/2010   Procedure: REDO CORONARY ARTERY BYPASS GRAFTING (CABG)TIMES TWO;  Surgeon: Rexene Alberts, MD;  Location: [facility];  Service: Open Heart Surgery;  Laterality: N/A;  using endoscopically harvested right saphenous vein and left radial artery; Transesophageal echocardiogram  . INGUINAL HERNIA REPAIR     ? left  . LEFT HEART CATHETERIZATION WITH CORONARY/GRAFT ANGIOGRAM N/A 03/01/2011   Procedure: LEFT HEART CATHETERIZATION WITH Beatrix Fetters;  Surgeon: Jettie Booze, MD;  Location: [facility] CATH LAB;  Service: Cardiovascular;  Laterality: N/A;  . LEFT HEART CATHETERIZATION WITH CORONARY/GRAFT ANGIOGRAM N/A 05/19/2011   Procedure: LEFT  HEART CATHETERIZATION WITH Beatrix Fetters;  Surgeon: Jettie Booze, MD;  Location: [facility] CATH LAB;  Service: Cardiovascular;  Laterality: N/A;  . LITHOTRIPSY    . OTHER SURGICAL HISTORY  03/25/1999   reexploration for bleeding s/p CABG - coagulopathy without mechanical source  . PARTIAL GASTRECTOMY    . PERCUTANEOUS CORONARY STENT INTERVENTION (PCI-S) N/A 03/02/2011   Procedure: PERCUTANEOUS CORONARY STENT INTERVENTION (PCI-S);  Surgeon: Jettie Booze, MD;  Location: [facility] CATH LAB;  Service: Cardiovascular;  Laterality: N/A;  . PERCUTANEOUS CORONARY STENT INTERVENTION (PCI-S)  05/19/2011   Procedure: PERCUTANEOUS CORONARY STENT INTERVENTION (PCI-S);  Surgeon: Jettie Booze, MD;   Location: [facility] CATH LAB;  Service: Cardiovascular;;  . RADIAL ARTERY HARVEST  11/22/2010   Procedure: RADIAL ARTERY HARVEST;  Surgeon: Rexene Alberts, MD;  Location: [facility];  Service: Open Heart Surgery;  Laterality: Left;  . Redo CABG  11/22/2010   CABG x2 with left radial to OM and SVG to RCA  . Right testicle surgery      Home Medications    Prior to Admission medications   Medication Sig Start Date End Date Taking? Authorizing Provider  atorvastatin (LIPITOR) 80 MG tablet Take 80 mg by mouth at bedtime.     [provider]  folic acid (FOLVITE) 1 MG tablet Take 1 mg by mouth daily.      [provider]  HYDROcodone-acetaminophen (NORCO) 5-325 MG tablet Take 1 tablet by mouth every 6 (six) hours as needed for moderate pain. 04/05/16   Tanda Rockers, MD  HYDROcodone-acetaminophen (NORCO/VICODIN) 5-325 MG tablet Take 0.5-1 tablets by mouth every 6 (six) hours as needed. 05/09/15   Davonna Belling, MD  nitroGLYCERIN (NITROSTAT) 0.4 MG SL tablet Place 0.4 mg under the tongue every 5 (five) minutes as needed. For chest pain    [provider]  therapeutic multivitamin-minerals (THERAGRAN-M) tablet Take 1 tablet by mouth daily.    [provider]    Family History Family History  Problem Relation Age of Onset  . Cancer Father   . Cancer Brother   . Stroke Brother     Social History Social History  Substance Use Topics  . Smoking status: Former Smoker    Packs/day: 0.25    Years: 25.00    Types: Cigarettes    Quit date: 01/17/1975  . Smokeless tobacco: Never Used  . Alcohol use No     Allergies   Patient has no known allergies.   Review of Systems Review of Systems All systems reviewed and are negative for acute change except as noted in the HPI.   Physical Exam Updated Vital Signs BP 118/79 (BP Location: Left Arm)   Pulse (!) 107   Temp 97.8 F (36.6 C) (Oral)   Resp 18   Wt 64.6 kg   SpO2 97%   BMI 22.97 kg/m   Physical Exam    Constitutional: He appears well-developed and well-nourished. No distress.  HENT:  Head: Normocephalic and atraumatic.  Eyes: Conjunctivae and EOM are normal.  Neck: Normal range of motion.  Cardiovascular: Regular rhythm.  Tachycardia present.   Pulmonary/Chest: Effort normal and breath sounds normal.  Abdominal: Soft. Bowel sounds are normal. He exhibits no distension. There is no tenderness.  Genitourinary: Penis normal. Left testis shows swelling and tenderness.  Genitourinary Comments: Absent right testicle. Patient with erythema and mild swelling to scrotal skin. No associated warmth. Has TTP to the testicle. Testicle also feels firm.  Nursing note and vitals reviewed.  ED Treatments / Results  Labs (all labs ordered are listed, but only abnormal results are displayed) Labs Reviewed  CBC WITH DIFFERENTIAL/PLATELET - Abnormal; Notable for the following:       Result Value   RBC 3.73 (*)    Hemoglobin 12.5 (*)    HCT 37.1 (*)    All other components within normal limits  COMPREHENSIVE METABOLIC PANEL - Abnormal; Notable for the following:    BUN 21 (*)    Albumin 3.4 (*)    ALT 15 (*)    Total Bilirubin 1.7 (*)    All other components within normal limits  URINALYSIS, ROUTINE W REFLEX MICROSCOPIC    EKG  EKG Interpretation None       Radiology US Scrotum  Result Date: 05/25/2016 CLINICAL DATA:  Left scrotal pain for 2 days. Right testicle removed 15 years ago for torsion. EXAM: ULTRASOUND OF SCROTUM TECHNIQUE: Complete ultrasound examination of the testicles, epididymis, and other scrotal structures was performed. COMPARISON:  CT abdomen and pelvis 05/09/2015 FINDINGS: Right testicle Surgically absent. Left testicle Measurements: 3.9 x 1.7 x 1.6 cm. No mass or microlithiasis visualized. Right epididymis:  Surgically absent. Left epididymis: Increased vascularity demonstrated in the left epididymis, vertically in the inferior area which corresponds to the area of pain.  Hydrocele:  None visualized. Varicocele:  Prominent left scrotal varices are present. IMPRESSION: Surgical absence of the right testicle. Increased left epididymal flow consistent with epididymitis. Prominent left scrotal varices. No evidence of testicular mass or torsion. Electronically Signed   By: Lucienne Capers M.D.   On: 05/25/2016 22:34   [place] Art/ven Flow Abd Pelv Doppler  Result Date: 05/25/2016 CLINICAL DATA:  Left scrotal pain for 2 days. Right testicle removed 15 years ago for torsion. EXAM: ULTRASOUND OF SCROTUM TECHNIQUE: Complete ultrasound examination of the testicles, epididymis, and other scrotal structures was performed. COMPARISON:  CT abdomen and pelvis 05/09/2015 FINDINGS: Right testicle Surgically absent. Left testicle Measurements: 3.9 x 1.7 x 1.6 cm. No mass or microlithiasis visualized. Right epididymis:  Surgically absent. Left epididymis: Increased vascularity demonstrated in the left epididymis, vertically in the inferior area which corresponds to the area of pain. Hydrocele:  None visualized. Varicocele:  Prominent left scrotal varices are present. IMPRESSION: Surgical absence of the right testicle. Increased left epididymal flow consistent with epididymitis. Prominent left scrotal varices. No evidence of testicular mass or torsion. Electronically Signed   By: Lucienne Capers M.D.   On: 05/25/2016 22:34    Procedures Procedures (including critical care time)  Medications Ordered in ED Medications  levofloxacin (LEVAQUIN) tablet 500 mg (not administered)  HYDROcodone-acetaminophen (NORCO/VICODIN) 5-325 MG per tablet 1 tablet (1 tablet Oral Given 05/25/16 2231)    Initial Impression / Assessment and Plan / ED Course  I have reviewed the triage vital signs and the nursing notes.  Pertinent labs & imaging results that were available during my care of the patient were reviewed by me and considered in my medical decision making (see chart for details).  Patient presenting  to the ED with scrotal pain and swelling for the last 3 days. Concern initially for possible torsion. Ultrasound obtained which showed no torsion and findings were consistent with epididymitis. Physical exam also consistent with epididymitis. Patient given a dose of Levaquin here in ED with a prescription to continue for the next 10 days. Encouraged to ice, elevate, take antibiotics, and pain medications. Patient does not have a primary care provider. Discussed return precautions and warning signs to seek care  sooner.  Final Clinical Impressions(s) / ED Diagnoses   Final diagnoses:  Epididymitis    New Prescriptions New Prescriptions   No medications on file     Katheren Shams, DO 05/25/16 2316    Blanchie Dessert, MD 05/27/16 0009

## 2016-07-07 ENCOUNTER — Encounter: Payer: Self-pay | Admitting: Internal Medicine

## 2016-07-07 ENCOUNTER — Ambulatory Visit (INDEPENDENT_AMBULATORY_CARE_PROVIDER_SITE_OTHER)
Admission: RE | Admit: 2016-07-07 | Discharge: 2016-07-07 | Disposition: A | Discharge status: Home / Self Care | Payer: Medicare Other | Source: Ambulatory Visit | Attending: Internal Medicine | Admitting: Internal Medicine

## 2016-07-07 ENCOUNTER — Ambulatory Visit (INDEPENDENT_AMBULATORY_CARE_PROVIDER_SITE_OTHER): Payer: Medicare Other | Admitting: Internal Medicine

## 2016-07-07 VITALS — BP 104/60 | HR 78 | Ht 65.0 in | Wt 144.0 lb

## 2016-07-07 DIAGNOSIS — J449 Chronic obstructive pulmonary disease, unspecified: Secondary | ICD-10-CM

## 2016-07-07 DIAGNOSIS — R911 Solitary pulmonary nodule: Secondary | ICD-10-CM | POA: Diagnosis not present

## 2016-07-07 NOTE — Progress Notes (Signed)
Spoke with pt and notified of results per Dr. Wert. Pt verbalized understanding and denied any questions. 

## 2016-07-07 NOTE — Assessment & Plan Note (Signed)
Spirometry 07/05/2015  FEV1 1.61 (86%)  Ratio 60    Remains asymptomatic > no rx needed

## 2016-07-07 NOTE — Progress Notes (Signed)
Subjective:    Patient ID: Jacob Hampton, male   DOB: 07-23-1924    MRN: [id number]    Brief patient profile:  89 yowm quit smoking in 1977  referred to pulmonary clinic 05/20/2015 by Dr Wynonia Lawman for abn cxr    History of Present Illness  05/20/2015 1st [facility] office visit/ Solara Goodchild   Chief Complaint  Patient presents with  . Pulmonary Consult    Referred by Dr. Tollie Eth for eval of abnormal cxr. Pt c/o DOE with minimal exertion and also unintended weight loss over the past few months.   doe x one month indolent onset/ progressive = MMRC3 = can't walk 100 yards even at a slow pace at a flat grade s stopping due to sob with gen weakness and wt loss - was having non-pleuritic L cp but this has resolved/ appetite poor s GI symptoms though has been having renal colic related to stones under eval by urology  rec PET done 05/27/15 isolated dz LLL    07/05/2015  Extended f/u ov/Ifrah Vest re:  Spn/ doe - all fm present = 2 daughters/ one son Chief Complaint  Patient presents with  . Follow-up    Review PET scan today.   used to walk mb and back up incline to house ok s sob but now def sob and stops when gets to house. rec No change rx feasible   04/05/2016  f/u ov/Karissa Meenan re:  spn LLL sup segment medially  Chief Complaint  Patient presents with  . Follow-up    Pt states overall doing well besides some left shoulder pain that started 2 wks ago.    ? Acute onset L post cp 24/7 no fluctuation/  not pleuritic/positional and no rash or problem using L arm  rec Please remember to go to the  x-ray department downstairs in the basement  for your tests - we will call you with the results when they are available. For pain tylenol or vicdin (norco) one every 4 hours as needed > only took a few      07/07/2016  f/u ov/Coretta Leisey re:  Spn/ no resp or pain meds needed  Chief Complaint  Patient presents with  . Follow-up    Breathing is doing well. No new co's.    Not limited by breathing from desired  activities  / no chest or shoulder pain  No obvious day to day or daytime variability or assoc excess/ purulent sputum or mucus plugs or hemoptysis or cp or chest tightness, subjective wheeze or overt sinus or hb symptoms. No unusual exp hx or h/o childhood pna/ asthma or knowledge of premature birth.  Sleeping ok without nocturnal  or early am exacerbation  of respiratory  c/o's or need for noct saba. Also denies any obvious fluctuation of symptoms with weather or environmental changes or other aggravating or alleviating factors except as outlined above   Current Medications, Allergies, Complete Past Medical History, Past Surgical History, Family History, and Social History were reviewed in [employer] record.  ROS  The following are not active complaints unless bolded sore throat, dysphagia, dental problems, itching, sneezing,  nasal congestion or excess/ purulent secretions, ear ache,   fever, chills, sweats, unintended wt loss, classically pleuritic or exertional cp,  orthopnea pnd or leg swelling, presyncope, palpitations, abdominal pain, anorexia, nausea, vomiting, diarrhea  or change in bowel or bladder habits, change in stools or urine, dysuria,hematuria,  rash, arthralgias, visual complaints, headache, numbness, weakness or ataxia or problems with  walking or coordination,  change in mood/affect or memory.                   Objective:   Physical Exam    amb frail  haorse wm  Nad/ walks stooped over at waist   07/07/2016       144  04/05/2016       145  10/05/2015       140  07/05/2015       139   05/20/15 142 lb 3.2 oz (64.501 kg)  05/09/15 143 lb 7 oz (65.063 kg)  05/20/11 156 lb 4.9 oz (70.9 kg)    Vital signs reviewed  - Note on arrival 02 sats  96% on RA     HEENT: edentulous/ nl  turbinates, and oropharynx. Nl external ear canals without cough reflex   NECK :  without JVD/Nodes/TM/ nl carotid upstrokes bilaterally   LUNGS: no acc muscle use,   Nl contour chest which is clear to A and P bilaterally without cough on insp or exp maneuvers   CV:  RRR  no s3 or murmur or increase in P2, no edema   ABD:  soft and nontender with nl inspiratory excursion in the supine position. No bruits or organomegaly, bowel sounds nl  MS:  Nl gait/ ext warm without deformities, calf tenderness, cyanosis or clubbing No obvious joint restrictions    SKIN: warm and dry without lesions    NEURO:  alert, approp, nl sensorium with  no motor deficits      CXR PA and Lateral:   07/07/2016 :    I personally reviewed images and agree with radiology impression as follows:    1. Prior CABG.  Heart size normal. 2. No acute pulmonary disease. Stable elevation left hemidiaphragm. Stable changes of pleuroparenchymal scarring.     Assessment:

## 2016-07-07 NOTE — Assessment & Plan Note (Signed)
Incidentally detected 05/09/15 x LLL medially x 74mm  PET 05/27/2015 >  1. Hypermetabolic nodule in the LEFT lower lobe consistent with primary bronchogenic carcinoma. 2. No evidence mediastinal nodal metastasis or distant metastasis  - see ov 07/05/2015 > declined surgical opinion  - 10/05/2015 not viz on plain cxr > neg cxr 04/05/2016  And 07/07/2016 > rec f/u yearly unless symptoms   Discussed in detail all the  indications, usual  risks and alternatives  relative to the benefits with patient/ son who agree  to proceed with conservative f/u as outlined

## 2016-07-07 NOTE — Patient Instructions (Signed)
Please remember to go to the  x-ray department downstairs in the basement  for your tests - we will call you with the results when they are available.     Please schedule a follow up visit in 12  months but call sooner if needed

## 2016-12-08 DIAGNOSIS — I4891 Unspecified atrial fibrillation: Secondary | ICD-10-CM

## 2016-12-08 HISTORY — DX: Unspecified atrial fibrillation: I48.91

## 2017-03-24 ENCOUNTER — Emergency Department (HOSPITAL_BASED_OUTPATIENT_CLINIC_OR_DEPARTMENT_OTHER)
Admission: EM | Admit: 2017-03-24 | Discharge: 2017-03-24 | Disposition: A | Discharge status: Other Acute Inpatient Hospital | Payer: Medicare Other | Attending: Emergency Medicine | Admitting: Emergency Medicine

## 2017-03-24 ENCOUNTER — Emergency Department (HOSPITAL_BASED_OUTPATIENT_CLINIC_OR_DEPARTMENT_OTHER): Payer: Medicare Other

## 2017-03-24 ENCOUNTER — Other Ambulatory Visit: Payer: Self-pay

## 2017-03-24 ENCOUNTER — Encounter (HOSPITAL_BASED_OUTPATIENT_CLINIC_OR_DEPARTMENT_OTHER): Payer: Self-pay | Admitting: Emergency Medicine

## 2017-03-24 DIAGNOSIS — Z87891 Personal history of nicotine dependence: Secondary | ICD-10-CM | POA: Diagnosis not present

## 2017-03-24 DIAGNOSIS — Z7982 Long term (current) use of aspirin: Secondary | ICD-10-CM | POA: Diagnosis not present

## 2017-03-24 DIAGNOSIS — R1031 Right lower quadrant pain: Secondary | ICD-10-CM | POA: Diagnosis not present

## 2017-03-24 DIAGNOSIS — R110 Nausea: Secondary | ICD-10-CM | POA: Diagnosis not present

## 2017-03-24 DIAGNOSIS — N3000 Acute cystitis without hematuria: Secondary | ICD-10-CM | POA: Diagnosis not present

## 2017-03-24 DIAGNOSIS — Z951 Presence of aortocoronary bypass graft: Secondary | ICD-10-CM | POA: Insufficient documentation

## 2017-03-24 DIAGNOSIS — R531 Weakness: Secondary | ICD-10-CM | POA: Diagnosis not present

## 2017-03-24 DIAGNOSIS — Z7902 Long term (current) use of antithrombotics/antiplatelets: Secondary | ICD-10-CM | POA: Insufficient documentation

## 2017-03-24 DIAGNOSIS — N201 Calculus of ureter: Secondary | ICD-10-CM | POA: Diagnosis not present

## 2017-03-24 DIAGNOSIS — J449 Chronic obstructive pulmonary disease, unspecified: Secondary | ICD-10-CM | POA: Diagnosis not present

## 2017-03-24 DIAGNOSIS — R319 Hematuria, unspecified: Secondary | ICD-10-CM | POA: Diagnosis present

## 2017-03-24 DIAGNOSIS — Z79899 Other long term (current) drug therapy: Secondary | ICD-10-CM | POA: Diagnosis not present

## 2017-03-24 DIAGNOSIS — I2510 Atherosclerotic heart disease of native coronary artery without angina pectoris: Secondary | ICD-10-CM | POA: Insufficient documentation

## 2017-03-24 LAB — COMPREHENSIVE METABOLIC PANEL
ALK PHOS: 86 U/L (ref 38–126)
ALT: 53 U/L (ref 17–63)
AST: 84 U/L — ABNORMAL HIGH (ref 15–41)
Albumin: 3.2 g/dL — ABNORMAL LOW (ref 3.5–5.0)
Anion gap: 9 (ref 5–15)
BILIRUBIN TOTAL: 1.8 mg/dL — AB (ref 0.3–1.2)
BUN: 27 mg/dL — ABNORMAL HIGH (ref 6–20)
CALCIUM: 8.4 mg/dL — AB (ref 8.9–10.3)
CO2: 24 mmol/L (ref 22–32)
CREATININE: 1.7 mg/dL — AB (ref 0.61–1.24)
Chloride: 95 mmol/L — ABNORMAL LOW (ref 101–111)
GFR, EST AFRICAN AMERICAN: 39 mL/min — AB (ref >60)
GFR, EST NON AFRICAN AMERICAN: 33 mL/min — AB (ref >60)
Glucose, Bld: 115 mg/dL — ABNORMAL HIGH (ref 65–99)
Potassium: 4.3 mmol/L (ref 3.5–5.1)
Sodium: 128 mmol/L — ABNORMAL LOW (ref 135–145)
TOTAL PROTEIN: 6.7 g/dL (ref 6.5–8.1)

## 2017-03-24 LAB — CBC
HCT: 36.3 % — ABNORMAL LOW (ref 39.0–52.0)
Hemoglobin: 12.1 g/dL — ABNORMAL LOW (ref 13.0–17.0)
MCH: 32.5 pg (ref 26.0–34.0)
MCHC: 33.3 g/dL (ref 30.0–36.0)
MCV: 97.6 fL (ref 78.0–100.0)
PLATELETS: 126 10*3/uL — AB (ref 150–400)
RBC: 3.72 MIL/uL — AB (ref 4.22–5.81)
RDW: 14 % (ref 11.5–15.5)
WBC: 5.9 10*3/uL (ref 4.0–10.5)

## 2017-03-24 LAB — URINALYSIS, ROUTINE W REFLEX MICROSCOPIC
BILIRUBIN URINE: NEGATIVE
Glucose, UA: NEGATIVE mg/dL
Ketones, ur: NEGATIVE mg/dL
Leukocytes, UA: NEGATIVE
NITRITE: NEGATIVE
PROTEIN: 30 mg/dL — AB
Specific Gravity, Urine: 1.025 (ref 1.005–1.030)
pH: 5.5 (ref 5.0–8.0)

## 2017-03-24 LAB — URINALYSIS, MICROSCOPIC (REFLEX)

## 2017-03-24 LAB — I-STAT CG4 LACTIC ACID, ED: LACTIC ACID, VENOUS: 1.8 mmol/L (ref 0.5–1.9)

## 2017-03-24 LAB — LIPASE, BLOOD: Lipase: 28 U/L (ref 11–51)

## 2017-03-24 MED ORDER — SODIUM CHLORIDE 0.9 % IV SOLN: INTRAVENOUS | Status: DC

## 2017-03-24 MED ORDER — SODIUM CHLORIDE 0.9 % IV BOLUS (SEPSIS)
500.0000 mL | Freq: Once | INTRAVENOUS | Status: AC
  Administered 2017-03-24: 500 mL via INTRAVENOUS

## 2017-03-24 MED ORDER — AMOXICILLIN 500 MG PO CAPS
500.0000 mg | ORAL_CAPSULE | Freq: Once | ORAL | Status: AC
  Administered 2017-03-24: 500 mg via ORAL
  Filled 2017-03-24: qty 1

## 2017-03-24 NOTE — ED Triage Notes (Signed)
Intermittent RLQ pain, hematuria, generalized weakness x 1 week. Pt also endorses nausea. Seen by PCP and currently being tx for UTI.

## 2017-03-24 NOTE — ED Notes (Signed)
ED Provider at bedside. 

## 2017-03-24 NOTE — ED Provider Notes (Signed)
[facility] Provider Note   CSN: [id number] Arrival date & time: 03/24/17  0935     History   Chief Complaint Chief Complaint  Patient presents with  . Hematuria  . Abdominal Pain    HPI Jacob Hampton is a 84 y.o. male.  Patient with a known history of very large renal stones.  Followed closely by [facility].  Patient was seen by them on Monday.  Urine and urine culture was obtained.  Sounds as if no specific radiological study was done.  Family was called on Thursday stating that he had urinary tract infection he was started on amoxicillin because it was sensitive.  Chart review shows that it was enterococcus.  Patient's urologist is Dr. Oneita Jolly.  The concern for years is been that of 1 of these very large stones up in the kidneys moves into the ureter that he could get into trouble.  Patient has not felt well all week.  Had some hematuria generalized weakness nausea but no vomiting decreased appetite.  Denies fevers.  Patient with complaint of right lower quadrant abdominal pain.      Past Medical History:  Diagnosis Date  . Anemia   . Angina   . Arthritis   . Blood transfusion    Had 8 units for GI bleed  . CAD (coronary artery disease)    Dr. Wynonia Lawman is his Cardiologist  . Dizzy    "when I take my blood pressure medicine sometimes it makes me a little dizzy"  . GERD (gastroesophageal reflux disease)   . Heart murmur   . History of primary tuberculosis   . History of upper GI bleeding    8 units for GI bleed   . Hyperlipidemia   . Hypertension   . Kidney stones   . Pneumonia    h/o walking pneumonia at least 20 years ago  . Shortness of breath 05/18/11   "sometimes lying down; sometimes w/activity"  . Sinus drainage    11/24/10 "every morning when I get up I have to clear it out"  . Sleep disorder    "trouble staying asleep"  . Tuberculosis 1969   Was in sanitorium for 1 year    Patient Active Problem List   Diagnosis Date  Noted  . COPD GOLD I  07/05/2015  . Solitary pulmonary nodule 05/20/2015  . Unstable angina ([place]) 05/18/2011  . History of atrial fibrillation   . History of tuberculosis 11/17/2010  . CAD (coronary artery disease) 11/16/2010  . Hyperlipidemia 11/16/2010  . History of upper GI bleeding 11/16/2010  . History of CABG 11/16/2010    Past Surgical History:  Procedure Laterality Date  . APPENDECTOMY    . bleeding ulcer     w/gallbladder  . CATARACT EXTRACTION W/ INTRAOCULAR LENS IMPLANT     bilaterally  . CHOLECYSTECTOMY     "w/bleeding ulcer OR"  . CORONARY ANGIOPLASTY WITH STENT PLACEMENT  02/2011  . CORONARY ARTERY BYPASS GRAFT  03/25/1999   CABG x5 using LIMA to LAD, SVG to Diag, SVG to OM, sequential SVG to PD and RPL, open SV harvest from left thigh and leg  . CORONARY ARTERY BYPASS GRAFT  11/22/2010   Procedure: REDO CORONARY ARTERY BYPASS GRAFTING (CABG)TIMES TWO;  Surgeon: Rexene Alberts, MD;  Location: [place];  Service: Open Heart Surgery;  Laterality: N/A;  using endoscopically harvested right saphenous vein and left radial artery; Transesophageal echocardiogram  . INGUINAL HERNIA REPAIR     ? left  .  LEFT HEART CATHETERIZATION WITH CORONARY/GRAFT ANGIOGRAM N/A 03/01/2011   Procedure: LEFT HEART CATHETERIZATION WITH Beatrix Fetters;  Surgeon: Jettie Booze, MD;  Location: [facility] CATH LAB;  Service: Cardiovascular;  Laterality: N/A;  . LEFT HEART CATHETERIZATION WITH CORONARY/GRAFT ANGIOGRAM N/A 05/19/2011   Procedure: LEFT HEART CATHETERIZATION WITH Beatrix Fetters;  Surgeon: Jettie Booze, MD;  Location[facility] C[facility] LAB;  Service: Cardiovascular;  Laterality: N/A;  . LITHOTRIPSY    . OTHER SURGICAL HISTORY  03/25/1999   reexploration for bleeding s/p CABG - coagulopathy without mechanical source  . PARTIAL GASTRECTOMY    . PERCUTANEOUS CORONARY STENT INTERVENTION (PCI-S) N/A 03/02/2011   Procedure: PERCUTANEOUS CORONARY STENT INTERVENTION (PCI-S);  Surgeon:  Jettie Booze, MD;  Location: [facility] CATH LAB;  Service: Cardiovascular;  Laterality: N/A;  . PERCUTANEOUS CORONARY STENT INTERVENTION (PCI-S)  05/19/2011   Procedure: PERCUTANEOUS CORONARY STENT INTERVENTION (PCI-S);  Surgeon: Jettie Booze, MD;  Location: [facility] CATH LAB;  Service: Cardiovascular;;  . RADIAL ARTERY HARVEST  11/22/2010   Procedure: RADIAL ARTERY HARVEST;  Surgeon: Rexene Alberts, MD;  Location: [place];  Service: Open Heart Surgery;  Laterality: Left;  . Redo CABG  11/22/2010   CABG x2 with left radial to OM and SVG to RCA  . Right testicle surgery         Home Medications    Prior to Admission medications   Medication Sig Start Date End Date Taking? Authorizing Provider  amoxicillin (AMOXIL) 500 MG capsule Take 500 mg by mouth 3 (three) times daily.   Yes [provider]  aspirin EC 81 MG tablet Take 81 mg by mouth daily.   Yes [provider]  atorvastatin (LIPITOR) 80 MG tablet Take 80 mg by mouth at bedtime.    Yes [provider]  clopidogrel (PLAVIX) 75 MG tablet Take 75 mg by mouth daily.   Yes [provider]  nitrofurantoin, macrocrystal-monohydrate, (MACROBID) 100 MG capsule Take 100 mg by mouth 2 (two) times daily.   Yes [provider]  pantoprazole (PROTONIX) 40 MG tablet Take 40 mg by mouth daily.   Yes [provider]  tamsulosin (FLOMAX) 0.4 MG CAPS capsule Take 0.4 mg by mouth.   Yes [provider]  traMADol (ULTRAM) 50 MG tablet Take by mouth every 6 (six) hours as needed.   Yes [provider]  folic acid (FOLVITE) 1 MG tablet Take 1 mg by mouth daily.      [provider]  HYDROcodone-acetaminophen (NORCO) 5-325 MG tablet Take 1 tablet by mouth every 6 (six) hours as needed for moderate pain. 04/05/16   Tanda Rockers, MD  nitroGLYCERIN (NITROSTAT) 0.4 MG SL tablet Place 0.4 mg under the tongue every 5 (five) minutes as needed. For chest pain    [provider]    therapeutic multivitamin-minerals (THERAGRAN-M) tablet Take 1 tablet by mouth daily.    [provider]    Family History Family History  Problem Relation Age of Onset  . Cancer Father   . Cancer Brother   . Stroke Brother     Social History Social History   Tobacco Use  . Smoking status: Former Smoker    Packs/day: 0.25    Years: 25.00    Pack years: 6.25    Types: Cigarettes    Last attempt to quit: 01/17/1975    Years since quitting: 42.2  . Smokeless tobacco: Never Used  Substance Use Topics  . Alcohol use: No    Alcohol/week:  0.0 oz  . Drug use: No     Allergies   Patient has no known allergies.   Review of Systems Review of Systems  Constitutional: Positive for activity change, appetite change and fatigue. Negative for fever.  HENT: Negative for congestion.   Eyes: Negative for redness.  Respiratory: Negative for shortness of breath.   Cardiovascular: Negative for chest pain.  Gastrointestinal: Positive for abdominal pain and nausea. Negative for vomiting.  Genitourinary: Positive for hematuria.  Musculoskeletal: Negative for back pain.  Skin: Negative for rash.  Neurological: Positive for weakness.  Hematological: Does not bruise/bleed easily.  Psychiatric/Behavioral: Negative for confusion.     Physical Exam Updated Vital Signs BP 105/72   Pulse 68   Temp 99.6 F (37.6 C) (Oral)   Resp 20   Ht 1.651 m (5\' 5" )   Wt 65.8 kg (145 lb)   SpO2 (!) 88%   BMI 24.13 kg/m   Physical Exam  Constitutional: He is oriented to person, place, and time. He appears well-developed and well-nourished. No distress.  HENT:  Head: Normocephalic and atraumatic.  Mucous membranes dry.  Cardiovascular: Normal rate, regular rhythm and normal heart sounds.  Pulmonary/Chest: Effort normal and breath sounds normal. No respiratory distress.  Abdominal: Soft. Normal appearance and bowel sounds are normal. There is no tenderness.  No tenderness to palpation  right lower quadrant no flank tenderness.  No CVA tenderness.  Musculoskeletal: Normal range of motion.  Neurological: He is alert and oriented to person, place, and time. No cranial nerve deficit or sensory deficit. He exhibits normal muscle tone. Coordination normal.  Skin: Skin is warm.  Nursing note and vitals reviewed.    ED Treatments / Results  Labs (all labs ordered are listed, but only abnormal results are displayed) Labs Reviewed  COMPREHENSIVE METABOLIC PANEL - Abnormal; Notable for the following components:      Result Value   Sodium 128 (*)    Chloride 95 (*)    Glucose, Bld 115 (*)    BUN 27 (*)    Creatinine, Ser 1.70 (*)    Calcium 8.4 (*)    Albumin 3.2 (*)    AST 84 (*)    Total Bilirubin 1.8 (*)    GFR calc non Af Amer 33 (*)    GFR calc Af Amer 39 (*)    All other components within normal limits  CBC - Abnormal; Notable for the following components:   RBC 3.72 (*)    Hemoglobin 12.1 (*)    HCT 36.3 (*)    Platelets 126 (*)    All other components within normal limits  URINALYSIS, ROUTINE W REFLEX MICROSCOPIC - Abnormal; Notable for the following components:   Hgb urine dipstick MODERATE (*)    Protein, ur 30 (*)    All other components within normal limits  URINALYSIS, MICROSCOPIC (REFLEX) - Abnormal; Notable for the following components:   Bacteria, UA MANY (*)    Squamous Epithelial / LPF 0-5 (*)    All other components within normal limits  LIPASE, BLOOD  LACTIC ACID, PLASMA  I-STAT CG4 LACTIC ACID, ED    EKG  EKG Interpretation None       Radiology Dg Chest 2 View  Result Date: 03/24/2017 CLINICAL DATA:  Weakness. EXAM: CHEST - 2 VIEW COMPARISON:  April 07, 2016 FINDINGS: The heart size and mediastinal contours are within normal limits. Both lungs are clear. The visualized skeletal structures are unremarkable. IMPRESSION: No active cardiopulmonary disease. Electronically Signed  By: Dorise Bullion III M.D   On: 03/24/2017 11:07   Ct  Renal Stone Study  Result Date: 03/24/2017 CLINICAL DATA:  Left lower quadrant abdominal pain. Gross hematuria. History of renal stones and prior lithotripsy procedure. EXAM: CT ABDOMEN AND PELVIS WITHOUT CONTRAST TECHNIQUE: Multidetector CT imaging of the abdomen and pelvis was performed following the standard protocol without IV contrast. COMPARISON:  PET-CT - 05/27/2015; CT abdomen and pelvis - 05/09/2015 FINDINGS: Lower chest: Limited visualization of the lower thorax demonstrates suspected enlargement of the known hypermetabolic nodule within the left lower lobe, currently measuring 2.5 x 1.9 cm (image 4, series 4), previously, 1.7 x 1.5 cm. Additionally, there has been development of several adjacent satellite nodules, the largest of which measures approximate 0.6 cm (image 4, series 4). Grossly unchanged subsegmental atelectasis within the left lower lobe with associated air bronchograms. Minimal dependent subpleural ground-glass atelectasis. No pleural effusion. Normal heart size. Post median sternotomy. Calcifications within the native coronary arteries. No pericardial effusion. Hepatobiliary: Normal hepatic contour. Post cholecystectomy. No ascites. Pancreas: Normal noncontrast appearance of the pancreas Spleen: Normal noncontrast appearance the spleen Adrenals/Urinary Tract: The approximately 3.8 cm partially exophytic hypoattenuating lesion arising from the superior pole the left kidney (image 18, series 2) while incompletely characterized on the present examination, is unchanged in favored to represent a renal cyst. There is an approximately 1.2 x 1.0 cm stone within the superior aspect the right ureter (coronal image 33, series 5) with associated moderate upstream ureterectasis and pelvicaliectasis. Note is made of 2 additional adjacent nonobstructing right-sided renal stones, the largest of which measures approximately 0.4 cm (image 39, series 2). Large, approximately 1.8 x 1.1 cm, Barnabas Lister shaped  nonobstructing stone within the inferior pole of the left kidney (image 31, series 2). Note is made of an additional punctate (approximately 5 mm) nonobstructing stone within the mid/interpolar aspect of the left kidney. No evidence of left-sided urinary obstruction. No renal stones are seen along expected course of the left ureter. There is mild diffuse thickening of the urinary bladder wall, potentially accentuated due to underdistention. Stomach/Bowel: Large colonic stool burden without evidence of enteric obstruction. The appendix is not visualized compatible provided operative history. Cervical clips about the gastroesophageal junction and gastric antrum. No evidence of enteric obstruction. No pneumoperitoneum, pneumatosis or portal venous gas. Vascular/Lymphatic: Large amount slightly irregular probably calcified atherosclerotic plaque throughout a normal caliber abdominal aorta. Mild ectasia of the right common iliac artery measuring 2.1 cm in diameter (coronal image 29, series 5) and aneurysmal dilatation of the left common iliac artery measuring 2.1 cm (coronal image 41, series 5), unchanged compared to the 04/2015 examination. Reproductive: Dystrophic calcifications within a borderline enlarged prostate gland. No free fluid pelvic cul-de-sac. Other: Regional soft tissues appear normal. Musculoskeletal: No acute or aggressive osseous abnormalities. Stigmata of DISH throughout the thoracic spine. IMPRESSION: 1. Approximately 1.2 cm stone within the superior aspect of the right ureter results in moderate upstream ureterectasis and pelvicaliectasis. 2. Bilateral nonobstructing nephrolithiasis as detailed above. No evidence of left-sided urinary obstruction. 3. 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 4. Aortic Atherosclerosis (ICD10-I70.0). 5. Increase in size of known  hypermetabolic left upper lobe pulmonary nodule, currently measuring 2.5 cm, previously, 1.7 cm with development of several adjacent satellite nodules, largest of which measures approximately 0.6 cm in diameter - findings worrisome for progression of disease. Electronically Signed  By: Sandi Mariscal M.D.   On: 03/24/2017 11:07    Procedures Procedures (including critical care time)  Medications Ordered in ED Medications  0.9 %  sodium chloride infusion ( Intravenous Rate/Dose Change 03/24/17 1015)  sodium chloride 0.9 % bolus 500 mL (0 mLs Intravenous Stopped 03/24/17 1052)  sodium chloride 0.9 % bolus 500 mL (0 mLs Intravenous Stopped 03/24/17 1424)     Initial Impression / Assessment and Plan / ED Course  I have reviewed the triage vital signs and the nursing notes.  Pertinent labs & imaging results that were available during my care of the patient were reviewed by me and considered in my medical decision making (see chart for details).    Patient's workup here showed a 1.2 cm stone in the upper right ureter.  Urinalysis suggestive of perhaps some persistent infection.  Labs otherwise showed some hyponatremia with a sodium of 128 and some mild renal insufficiency.  Otherwise workup negative.  Discussed with on-call urology for [facility] Dr. Bethann Punches.  She reviewed his culture results from Monday.  Feels that there definitely is persistent infection and patient will require a stent.  She requested that the patient be transferred to [place] to the hospitalist Dr. Idolina Primer, no beds available so patient will go to the emergency department and then be admitted from there.  The [facility] coordinator told me Dr. Idolina Primer would be the accepting physician.  The plan is for a stent later today or tomorrow.  The patient continued here on his p.o. amoxicillin as an antibiotic.  Patient given IV fluids.  A total of 1 L.  Because clinically he appeared dehydrated.  We  will arrange transfer.  EMTALA completed.  Patient has been hemodynamically stable here blood pressure systolics have been low 356Y.  Not tachycardic.  No concerns for sepsis lactic acid was not elevated.   Final Clinical Impressions(s) / ED Diagnoses   Final diagnoses:  Right ureteral stone  Acute cystitis without hematuria    ED Discharge Orders    None       Fredia Sorrow, MD 03/24/17 [phone]

## 2017-03-24 NOTE — ED Notes (Signed)
Pt unable to void at this time. Placed on cardiac monitor a and auto VS

## 2017-03-24 NOTE — ED Notes (Signed)
[facility] called for transport @ 1617.  Spoke with Cassie.

## 2017-03-24 NOTE — ED Notes (Signed)
Pt transported to CT and XR via stretcher.

## 2017-07-11 ENCOUNTER — Ambulatory Visit (INDEPENDENT_AMBULATORY_CARE_PROVIDER_SITE_OTHER): Payer: Medicare Other | Admitting: Internal Medicine

## 2017-07-11 ENCOUNTER — Encounter: Payer: Self-pay | Admitting: Internal Medicine

## 2017-07-11 VITALS — BP 120/60 | HR 53 | Ht 65.0 in | Wt 123.0 lb

## 2017-07-11 DIAGNOSIS — R911 Solitary pulmonary nodule: Secondary | ICD-10-CM

## 2017-07-11 NOTE — Patient Instructions (Addendum)
Consider a lower dose of lipitor per Dr Wynonia Lawman   Eat more fried fish and hush puppy's if that's what you like and ensure afterwards    Please schedule a follow up visit in 3 months but call sooner if needed with cxr on return

## 2017-07-11 NOTE — Progress Notes (Signed)
Subjective:    Patient ID: Jacob Hampton, male   DOB: 11-02-1924    MRN: [id number]    Brief patient profile:  40  yowm quit smoking in 1977  referred to pulmonary clinic 05/20/2015 by Dr Wynonia Lawman for abn cxr    History of Present Illness  05/20/2015 1st [facility] office visit/ Malesha Suliman   Chief Complaint  Patient presents with  . Pulmonary Consult    Referred by Dr. Tollie Eth for eval of abnormal cxr. Pt c/o DOE with minimal exertion and also unintended weight loss over the past few months.   doe x one month indolent onset/ progressive = MMRC3 = can't walk 100 yards even at a slow pace at a flat grade s stopping due to sob with gen weakness and wt loss - was having non-pleuritic L cp but this has resolved/ appetite poor s GI symptoms though has been having renal colic related to stones under eval by urology  rec PET done 05/27/15 isolated dz LLL       04/05/2016  f/u ov/Aleathea Pugmire re:  spn LLL sup segment medially  Chief Complaint  Patient presents with  . Follow-up    Pt states overall doing well besides some left shoulder pain that started 2 wks ago.    ? Acute onset L post cp 24/7 no fluctuation/  not pleuritic/positional and no rash or problem using L arm  rec Please remember to go to the  x-ray department downstairs in the basement  for your tests - we will call you with the results when they are available. For pain tylenol or vicdin (norco) one every 4 hours as needed > only took a few      07/07/2016  f/u ov/Maryl Blalock re:  Spn/ no resp or pain meds needed  Chief Complaint  Patient presents with  . Follow-up    Breathing is doing well. No new co's.   Not limited by breathing from desired activities  / no chest or shoulder pain rec F/u one year   07/11/2017  f/u ov/Qusai Kem re: spn  Chief Complaint  Patient presents with  . Follow-up    weight loss about 20lbs since last ov.  Dyspnea:  Still going to mb and back but struggles a bit with doe back to house  Cough: no  SABA use:  none 02: no  No cp / poor appeitite since kidney stone pain  but it has resolved and was not localized to L lower chest  On the other hand appetite has not recovered since pain resolved and not requiring analgesics now  Likes fried flounder but "my heart doctor won't let me eat it due to my cholesterol" on lipitor 80   No  assoc excess/ purulent sputum or mucus plugs or hemoptysis or cp or chest tightness, subjective wheeze or overt sinus or hb symptoms.   Sleeping: fine flat without nocturnal  or early am exacerbation  of respiratory  c/o's or need for noct saba. Also denies any obvious fluctuation of symptoms with weather or environmental changes or other aggravating or alleviating factors except as outlined above   No unusual exposure hx or h/o childhood pna/ asthma or knowledge of premature birth.  Current Allergies, Complete Past Medical History, Past Surgical History, Family History, and Social History were reviewed in [employer] record.  ROS  The following are not active complaints unless bolded Hoarseness, sore throat, dysphagia, dental problems, itching, sneezing,  nasal congestion or discharge of excess mucus or purulent  secretions, ear ache,   fever, chills, sweats, unintended wt loss or wt gain, classically pleuritic or exertional cp,  orthopnea pnd or arm/hand swelling  or leg swelling, presyncope, palpitations, abdominal pain, anorexia, nausea, vomiting, diarrhea  or change in bowel habits or change in bladder habits, change in stools or change in urine, dysuria, hematuria,  rash, arthralgias, visual complaints, headache, numbness, weakness or ataxia or problems with walking or coordination,  change in mood or  memory.        Current Meds  Medication Sig  . aspirin EC 81 MG tablet Take 81 mg by mouth daily.  Marland Kitchen atorvastatin (LIPITOR) 80 MG tablet Take 80 mg by mouth at bedtime.   . clopidogrel (PLAVIX) 75 MG tablet Take 75 mg by mouth daily.  . folic acid  (FOLVITE) 1 MG tablet Take 1 mg by mouth daily.    . nitroGLYCERIN (NITROSTAT) 0.4 MG SL tablet Place 0.4 mg under the tongue every 5 (five) minutes as needed. For chest pain  . pantoprazole (PROTONIX) 40 MG tablet Take 40 mg by mouth daily.  . tamsulosin (FLOMAX) 0.4 MG CAPS capsule Take 0.4 mg by mouth.  . traMADol (ULTRAM) 50 MG tablet Take by mouth every 6 (six) hours as needed.  . [DISCONTINUED] therapeutic multivitamin-minerals (THERAGRAN-M) tablet Take 1 tablet by mouth daily.            Objective:   Physical Exam    amb frail wm nad     07/11/2017       123  07/07/2016       144  04/05/2016       145  10/05/2015       140  07/05/2015       139   05/20/15 142 lb 3.2 oz (64.501 kg)  05/09/15 143 lb 7 oz (65.063 kg)  05/20/11 156 lb 4.9 oz (70.9 kg)     Vital signs reviewed - Note on arrival 02 sats  96% on RA      HEENT: nl  turbinates bilaterally, and oropharynx. Nl external ear canals without cough reflex - edentulous   NECK :  without JVD/Nodes/TM/ nl carotid upstrokes bilaterally   LUNGS: no acc muscle use,  Nl contour chest which is clear to A and P bilaterally without cough on insp or exp maneuvers   CV:  RRR  no s3 or murmur or increase in P2, and no edema   ABD:  soft and nontender with nl inspiratory excursion in the supine position. No bruits or organomegaly appreciated, bowel sounds nl  MS:  Nl gait/ ext warm without deformities, calf tenderness, cyanosis or clubbing No obvious joint restrictions   SKIN: warm and dry without lesions    NEURO:  alert, approp, nl sensorium with  no motor or cerebellar deficits apparent.      I personally reviewed images and agree with radiology impression as follows:     CT renal 06/13/17  LLL nodule 2.6 x 2.1 with 1.0 cm satellite lesion            Assessment:

## 2017-07-12 ENCOUNTER — Encounter: Payer: Self-pay | Admitting: Internal Medicine

## 2017-07-12 NOTE — Assessment & Plan Note (Signed)
Incidentally detected 05/09/15 x LLL medially x 73mm  PET 05/27/2015 >  1. Hypermetabolic nodule in the LEFT lower lobe consistent with primary bronchogenic carcinoma. 2. No evidence mediastinal nodal metastasis or distant metastasis  - see ov 07/05/2015 > declined surgical opinion  - 10/05/2015 not viz on plain cxr > neg cxr 04/05/2016  And 07/07/2016   -    CT renal 06/13/17  LLL nodule 2.6 x 2.1 with 1.0 cm satellite lesion  Unlikely this lesion caused his recent back pain attributed to kidney stones though concerned he's now losing wt and need to see if it stablizes and if not might consider bx/ onc or RT opinion on best palliative approach here.   Advised on diet which can be liberalized and  ? Reduce lipitor but defer that to Dr Wynonia Lawman  Discussed in detail all the  indications, usual  risks and alternatives  relative to the benefits with patient who agrees to proceed with conservative f/u as outlined     I had an extended discussion with the patient/fm reviewing all relevant studies completed to date and  lasting 15 to 20 minutes of a 25 minute visit    Each maintenance medication was reviewed in detail including most importantly the difference between maintenance and prns and under what circumstances the prns are to be triggered using an action plan format that is not reflected in the computer generated alphabetically organized AVS.    Please see AVS for specific instructions unique to this visit that I personally wrote and verbalized to the the pt in detail and then reviewed with pt  by my nurse highlighting any  changes in therapy recommended at today's visit to their plan of care.

## 2017-08-01 ENCOUNTER — Encounter: Payer: Self-pay | Admitting: Cardiology

## 2017-08-01 DIAGNOSIS — I2510 Atherosclerotic heart disease of native coronary artery without angina pectoris: Secondary | ICD-10-CM

## 2017-08-01 DIAGNOSIS — Z951 Presence of aortocoronary bypass graft: Secondary | ICD-10-CM

## 2017-08-01 DIAGNOSIS — Z8719 Personal history of other diseases of the digestive system: Secondary | ICD-10-CM

## 2017-08-01 DIAGNOSIS — R911 Solitary pulmonary nodule: Secondary | ICD-10-CM

## 2017-08-01 DIAGNOSIS — I483 Typical atrial flutter: Secondary | ICD-10-CM

## 2017-08-01 NOTE — Progress Notes (Signed)
Jacob Hampton    Date of visit:  08/01/2017 DOB:  04/02/1924    Age:  84 yrs. Medical record number:  [id number]     Account number:  [id number] Primary Care Provider: Margy Clarks ____________________________ CURRENT DIAGNOSES  1. CAD Native without angina  2. Atrial flutter, typical  3. Presence of aortocoronary bypass graft  4. Presence of coronary angioplasty implant and graft  5. Peripheral vascular disease, unspecified  6. Ventricular premature depolarization  7. Malignant Neoplasm Of Upper Lobe, Unspecified Bronchus Or Lung  8. Hyperlipidemia  9. Dizziness and giddiness  10. Other long term (current) drug therapy  11. Personal history of tuberculosis  12. Personal history of peptic ulcer disease ____________________________ ALLERGIES  No Known Allergies ____________________________ MEDICATIONS  1. aspirin 81 mg chewable tablet, 1 p.o. daily  2. atorvastatin 80 mg tablet, 1 p.o. daily  3. clopidogrel 75 mg tablet, 1 p.o. daily  4. ezetimibe 10 mg tablet, 1 p.o. daily  5. folic acid 1 mg tablet, 1 p.o. q.d.  6. nitroglycerin 0.4 mg sublingual tablet, PRN  7. oxycodone-acetaminophen 5 mg-325 mg tablet, PRN ____________________________ CHIEF COMPLAINTS  Followup of Atrial flutter, typical ____________________________ HISTORY OF PRESENT ILLNESS Patient returns for cardiac followup. He was able to have his lithotripsy for his renal stones and also had to have a cystoscopy and extraction. He has been able to return to bowling but has episodic spells of dizziness. The dizziness has been evaluated through the years with event monitoring. Most recently on the lithotripsy he was found to be in atrial flutter. He has a history of redo bypass grafting as well as stenting of the vein graft previously. He currently has no angina. When he was diagnosed with atrial flutter we wanted him to go ahead and have the lithotripsy and then we would deal with atrial flutter later. It is  pertinent that he has a prior history of a GI bleed requiring at least 8 units of packed cells previously. For this reason we thought he would be at high risk of anticoagulation. He is quite functional although he is 46. He and his son were back today and we talked about the management of atrial flutter. I had thought originally that it might be reasonable to try a short course of anticoagulation consider ablation of the atrial flutter circuit and then perhaps getting off of anticoagulation at that point. ____________________________ PAST HISTORY  Past Medical Illnesses:  hyperlipidemia, history of tuberculosis, history of peptic ulcer disease;  Cardiovascular Illnesses:  CAD, dilated left subclavian  artery, atrial flutter;  Surgical Procedures:  CABG w LIMA to LAD, SVG to dx, SVG to OM, SVg to PD-PL 03/25/99 Dr. Roxy Manns, herniorrhaphy, R. testicle surgery, gastrectomy-subtotal, Redo CABG 11/12 Dr. Roxy Manns left radial to OM, SVG to RCA;  NYHA Classification:  I;  Canadian Angina Classification:  Class 0: Asymptomatic;  Cardiology Procedures-Invasive:  PTCA of the RCA 1988, PTCA of the RCA 1991, cardiac cath (left) May 2013, BMS stent 2013 to prox RCA and ostium of radial graft to OM, Promus stent ostium of radial graft for ISR May 2013;  Cardiology Procedures-Noninvasive:  echocardiogram May 2019;  Cardiac Cath Results:  99% stenosis distal Left main, occluded LAD, widely patent stent RCA, mid 70%, 70% stenosis mid RCA, occluded RCA SVG, ostial ISR stenosis of radial graft to OM, occluded Diag 1 SVG, widely patent LAD LIMA graft;  Peripheral Vascular Procedures:  CT scan of subclavian 7/01/24/2004;  LVEF of 60% documented via echocardiogram on 05/18/2017,  ____________________________ Caleen Essex TEST DATES EKG Date:  08/01/2017;  CABG: 11/22/2010;  Stent Placement Date: 05/19/2011;  Holter/Event Monitor Date: 08/19/2014;  Echocardiography Date: 05/18/2017;  Chest Xray Date: 05/18/2011;  CT Scan Date:  02/04/2005    ____________________________ FAMILY HISTORY Brother -- Brother dead Brother -- Brother dead, Unknown Disease Brother -- Brother dead, Cancer, Deceased Brother -- Brother dead, Bowel cancer, Deceased Brother -- Brother dead, Unknown Disease, Deceased Brother -- Brother dead, Deceased Brother -- Brother dead, Unknown Disease, Deceased Father -- Father dead, Unknown Disease, Deceased Mother -- Mother dead, Unknown Disease, Deceased Sister -- Sister alive with problem, Dementia/Alzheimer's Sister -- Sister dead, Heart Attack, Deceased ____________________________ SOCIAL HISTORY Alcohol Use:  no alcohol use;  Smoking:  used to smoke but quit 1996;  Diet:  regular diet;  Lifestyle:  widower, 2 sons and 2 daughters;  Exercise:  treadmill and bowling;  Occupation:  retired and [employer];  Residence:  lives with son;   ____________________________ REVIEW OF SYSTEMS General:  no change in weight Eyes: glaucoma, cataract extraction bilaterally Respiratory: denies dyspnea, cough, wheezing or hemoptysis. Cardiovascular:  please review HPI Abdominal: denies dyspepsia, GI bleeding, constipation, or diarrhea Genitourinary-Male: see HPI  Musculoskeletal:  arthritis of the knee Neurological:  dizziness  ____________________________ PHYSICAL EXAMINATION VITAL SIGNS  Blood Pressure:  100/66 Sitting, Left arm, regular cuff  , 90/60 Standing, Left arm and regular cuff   Pulse:  70/min. Weight:  132.00 lbs. Height:  68"BMI: 20  Constitutional:  pleasant elderly white male in no acute distress Skin:  scattered hemangiomas, scattered ecchymosis Head:  normocephalic, normal hair pattern, no masses or tenderness Neck:  supple, no masses, thyromegaly, JVD. Carotid pulses are full and equal bilaterally without bruits. Chest:  reduced breath sounds left base, healed median sternotomy scar Cardiac:  irregular rhythm, normal S1 and S2, no S3 or S4 Peripheral Pulses:  femoral pulses 2+,  posterior tibial pulses 3+, dorsalis pedis pulses 2+ Extremities & Back:  well healed saphenous vein donor site LLE, no edema present Neurological:  unsteady gait ____________________________ MOST RECENT LIPID PANEL 01/26/17  CHOL TOTL 187 mg/dl, LDL 109 NM, HDL 57 mg/dl, TRIGLYCER 101 mg/dl and CHOL/HDL 3.3 (Calc) ____________________________ IMPRESSIONS/PLAN  1. Persistent atrial flutter 2. CAD with previous stenting of the vein graft to the marginal 3. Prior history of GI bleeding  Recommendations:  Total EKG personally reviewed shows PVCs and atrial flutter with controlled response. He does have episodic dizziness that we have not been able to correlate this with arrhythmia through the years. I would like him to have a consultation with electrophysiologist to see what he thinks about b.i.d. as a ablation followed by short course of anticoagulation following that. He does have advanced age and the other question would be just leave things alone. Awaiting EP opinion. Followup in 3 months. ____________________________ TODAYS ORDERS  1. 12 Lead EKG: Today  2. Return Visit: 3 months                       ____________________________ Cardiology Physician:  Kerry Hough MD [facility]

## 2017-08-08 DIAGNOSIS — I4892 Unspecified atrial flutter: Secondary | ICD-10-CM | POA: Insufficient documentation

## 2017-08-08 DIAGNOSIS — Z955 Presence of coronary angioplasty implant and graft: Secondary | ICD-10-CM | POA: Insufficient documentation

## 2017-08-08 DIAGNOSIS — I493 Ventricular premature depolarization: Secondary | ICD-10-CM | POA: Insufficient documentation

## 2017-08-08 DIAGNOSIS — Z951 Presence of aortocoronary bypass graft: Secondary | ICD-10-CM | POA: Insufficient documentation

## 2017-08-14 ENCOUNTER — Ambulatory Visit (INDEPENDENT_AMBULATORY_CARE_PROVIDER_SITE_OTHER): Payer: Medicare Other | Admitting: Internal Medicine

## 2017-08-14 ENCOUNTER — Encounter: Payer: Self-pay | Admitting: Internal Medicine

## 2017-08-14 VITALS — BP 120/60 | HR 70 | Ht 65.0 in | Wt 133.0 lb

## 2017-08-14 DIAGNOSIS — I4892 Unspecified atrial flutter: Secondary | ICD-10-CM

## 2017-08-14 MED ORDER — APIXABAN 2.5 MG PO TABS: 2.5000 mg | ORAL_TABLET | Freq: Two times a day (BID) | ORAL | 1 refills | Status: DC

## 2017-08-14 NOTE — Patient Instructions (Addendum)
Medication Instructions:  Your physician has recommended you make the following change in your medication:   On August 21, 2017 you are going to STOP your ASPIRIN and PLAVIX and START: ELIQUIS 2.5 mg ONE TABLET by MOUTH TWICE A DAY.  DO NOT miss any doses of YOUR Eliquis or your procedure will be rescheduled.  Labwork: You will get lab work today:  BMP and CBC.  Testing/Procedures: Your physician has recommended that you have an ablation. Catheter ablation is a medical procedure used to treat some cardiac arrhythmias (irregular heartbeats). During catheter ablation, a long, thin, flexible tube is put into a blood vessel in your groin (upper thigh), or neck. This tube is called an ablation catheter. It is then guided to your heart through the blood vessel. Radio frequency waves destroy small areas of heart tissue where abnormal heartbeats may cause an arrhythmia to start. Please see the instruction sheet given to you today.  Follow-Up:  You will follow up with Dr. Lovena Le 4 weeks after your procedure.   Any Other Special Instructions Will Be Listed Below (If Applicable).  ABLATION INSTRUCTIONS:  Please arrive at the [facility] main entrance of [facility] hospital at:  6:30 am on September 12, 2017 Do not eat or drink after midnight prior to procedure Do not take any medications the morning of the procedure Plan for one night stay-but you may be discharged after your procedure You will need someone to drive you home at discharge  If you need a refill on your cardiac medications before your next appointment, please call your pharmacy.   Cardiac Ablation Cardiac ablation is a procedure to disable (ablate) a small amount of heart tissue in very specific places. The heart has many electrical connections. Sometimes these connections are abnormal and can cause the heart to beat very fast or irregularly. Ablating some of the problem areas can improve the heart rhythm or return it to normal.  Ablation may be done for people who:  Have Wolff-Parkinson-White syndrome.  Have fast heart rhythms (tachycardia).  Have taken medicines for an abnormal heart rhythm (arrhythmia) that were not effective or caused side effects.  Have a high-risk heartbeat that may be life-threatening.  During the procedure, a small incision is made in the neck or the groin, and a long, thin, flexible tube (catheter) is inserted into the incision and moved to the heart. Small devices (electrodes) on the tip of the catheter will send out electrical currents. A type of X-ray (fluoroscopy) will be used to help guide the catheter and to provide images of the heart. Tell a health care provider about:  Any allergies you have.  All medicines you are taking, including vitamins, herbs, eye drops, creams, and over-the-counter medicines.  Any problems you or family members have had with anesthetic medicines.  Any blood disorders you have.  Any surgeries you have had.  Any medical conditions you have, such as kidney failure.  Whether you are pregnant or may be pregnant. What are the risks? Generally, this is a safe procedure. However, problems may occur, including:  Infection.  Bruising and bleeding at the catheter insertion site.  Bleeding into the chest, especially into the sac that surrounds the heart. This is a serious complication.  Stroke or blood clots.  Damage to other structures or organs.  Allergic reaction to medicines or dyes.  Need for a permanent pacemaker if the normal electrical system is damaged. A pacemaker is a small computer that sends electrical signals to the heart  and helps your heart beat normally.  The procedure not being fully effective. This may not be recognized until months later. Repeat ablation procedures are sometimes required.  What happens before the procedure?  Follow instructions from your health care provider about eating or drinking restrictions.  Ask your  health care provider about: ? Changing or stopping your regular medicines. This is especially important if you are taking diabetes medicines or blood thinners. ? Taking medicines such as aspirin and ibuprofen. These medicines can thin your blood. Do not take these medicines before your procedure if your health care provider instructs you not to.  Plan to have someone take you home from the hospital or clinic.  If you will be going home right after the procedure, plan to have someone with you for 24 hours. What happens during the procedure?  To lower your risk of infection: ? Your health care team will wash or sanitize their hands. ? Your skin will be washed with soap. ? Hair may be removed from the incision area.  An IV tube will be inserted into one of your veins.  You will be given a medicine to help you relax (sedative).  The skin on your neck or groin will be numbed.  An incision will be made in your neck or your groin.  A needle will be inserted through the incision and into a large vein in your neck or groin.  A catheter will be inserted into the needle and moved to your heart.  Dye may be injected through the catheter to help your surgeon see the area of the heart that needs treatment.  Electrical currents will be sent from the catheter to ablate heart tissue in desired areas. There are three types of energy that may be used to ablate heart tissue: ? Heat (radiofrequency energy). ? Laser energy. ? Extreme cold (cryoablation).  When the necessary tissue has been ablated, the catheter will be removed.  Pressure will be held on the catheter insertion area to prevent excessive bleeding.  A bandage (dressing) will be placed over the catheter insertion area. The procedure may vary among health care providers and hospitals. What happens after the procedure?  Your blood pressure, heart rate, breathing rate, and blood oxygen level will be monitored until the medicines you were  given have worn off.  Your catheter insertion area will be monitored for bleeding. You will need to lie still for a few hours to ensure that you do not bleed from the catheter insertion area.  Do not drive for 24 hours or as long as directed by your health care provider. Summary  Cardiac ablation is a procedure to disable (ablate) a small amount of heart tissue in very specific places. Ablating some of the problem areas can improve the heart rhythm or return it to normal.  During the procedure, electrical currents will be sent from the catheter to ablate heart tissue in desired areas. This information is not intended to replace advice given to you by your health care provider. Make sure you discuss any questions you have with your health care provider. Document Released: 05/21/2008 Document Revised: 11/22/2015 Document Reviewed: 11/22/2015 Elsevier Interactive Patient Education  [employer].

## 2017-08-14 NOTE — Progress Notes (Signed)
HPI Jacob Hampton is referred today by Dr. Wynonia Lawman for evaluation of atrial flutter. He is a pleasant elderly man with CAD, s/p CABG twice, last about 5 years ago. He has developed atrial flutter with a CVR associated with worsening fatigue. He has not had syncope. He does not have palpitations. He has been on plavix.  No Known Allergies   Current Outpatient Medications  Medication Sig Dispense Refill  . aspirin EC 81 MG tablet Take 81 mg by mouth daily.    Marland Kitchen atorvastatin (LIPITOR) 80 MG tablet Take 80 mg by mouth at bedtime.     . clopidogrel (PLAVIX) 75 MG tablet Take 75 mg by mouth daily.    . folic acid (FOLVITE) 1 MG tablet Take 1 mg by mouth daily.      . nitroGLYCERIN (NITROSTAT) 0.4 MG SL tablet Place 0.4 mg under the tongue every 5 (five) minutes as needed. For chest pain    . pantoprazole (PROTONIX) 40 MG tablet Take 40 mg by mouth daily.    . tamsulosin (FLOMAX) 0.4 MG CAPS capsule Take 0.4 mg by mouth.    . traMADol (ULTRAM) 50 MG tablet Take by mouth every 6 (six) hours as needed.     No current facility-administered medications for this visit.      Past Medical History:  Diagnosis Date  . Anemia   . Angina   . Arthritis   . Atrial flutter ([place])   . Blood transfusion    Had 8 units for GI bleed  . CAD (coronary artery disease)    Dr. Wynonia Lawman is his Cardiologist  . Dizzy    "when I take my blood pressure medicine sometimes it makes me a little dizzy"  . GERD (gastroesophageal reflux disease)   . Heart murmur   . History of primary tuberculosis   . History of upper GI bleeding    8 units for GI bleed   . Hyperlipidemia   . Hypertension   . Kidney stones   . Malignant neoplasm of upper lobe, unspecified bronchus or lung ([place])   . Pneumonia    h/o walking pneumonia at least 20 years ago  . Presence of aortocoronary bypass graft   . Presence of coronary angioplasty implant and graft   . Shortness of breath 05/18/11   "sometimes lying down; sometimes  w/activity"  . Sinus drainage    11/24/10 "every morning when I get up I have to clear it out"  . Sleep disorder    "trouble staying asleep"  . Tuberculosis 1969   Was in sanitorium for 1 year  . Ventricular premature depolarization     ROS:   All systems reviewed and negative except as noted in the HPI.   Past Surgical History:  Procedure Laterality Date  . APPENDECTOMY    . bleeding ulcer     w/gallbladder  . CATARACT EXTRACTION W/ INTRAOCULAR LENS IMPLANT     bilaterally  . CHOLECYSTECTOMY     "w/bleeding ulcer OR"  . CORONARY ANGIOPLASTY WITH STENT PLACEMENT  02/2011  . CORONARY ARTERY BYPASS GRAFT  03/25/1999   CABG x5 using LIMA to LAD, SVG to Diag, SVG to OM, sequential SVG to PD and RPL, open SV harvest from left thigh and leg  . CORONARY ARTERY BYPASS GRAFT  11/22/2010   Procedure: REDO CORONARY ARTERY BYPASS GRAFTING (CABG)TIMES TWO;  Surgeon: Rexene Alberts, MD;  Location: [facility];  Service: Open Heart Surgery;  Laterality: N/A;  using endoscopically  harvested right saphenous vein and left radial artery; Transesophageal echocardiogram  . INGUINAL HERNIA REPAIR     ? left  . LEFT HEART CATHETERIZATION WITH CORONARY/GRAFT ANGIOGRAM N/A 03/01/2011   Procedure: LEFT HEART CATHETERIZATION WITH Beatrix Fetters;  Surgeon: Jettie Booze, MD;  Location: [facility] CATH LAB;  Service: Cardiovascular;  Laterality: N/A;  . LEFT HEART CATHETERIZATION WITH CORONARY/GRAFT ANGIOGRAM N/A 05/19/2011   Procedure: LEFT HEART CATHETERIZATION WITH Beatrix Fetters;  Surgeon: Jettie Booze, MD;  Location: [facility] CATH LAB;  Service: Cardiovascular;  Laterality: N/A;  . LITHOTRIPSY    . OTHER SURGICAL HISTORY  03/25/1999   reexploration for bleeding s/p CABG - coagulopathy without mechanical source  . PARTIAL GASTRECTOMY    . PERCUTANEOUS CORONARY STENT INTERVENTION (PCI-S) N/A 03/02/2011   Procedure: PERCUTANEOUS CORONARY STENT INTERVENTION (PCI-S);  Surgeon: Jettie Booze,  MD;  Location: [facility] C[facility] LAB;  Service: Cardiovascular;  Laterality: N/A;  . PERCUTANEOUS CORONARY STENT INTERVENTION (PCI-S)  05/19/2011   Procedure: PERCUTANEOUS CORONARY STENT INTERVENTION (PCI-S);  Surgeon: Jettie Booze, MD;  Location: [facility] CATH LAB;  Service: Cardiovascular;;  . RADIAL ARTERY HARVEST  11/22/2010   Procedure: RADIAL ARTERY HARVEST;  Surgeon: Rexene Alberts, MD;  Location: [facility];  Service: Open Heart Surgery;  Laterality: Left;  . Redo CABG  11/22/2010   CABG x2 with left radial to OM and SVG to RCA  . Right testicle surgery       Family History  Problem Relation Age of Onset  . Cancer Father   . Cancer Brother   . Stroke Brother      Social History   Socioeconomic History  . Marital status: Widowed    Spouse name: Not on file  . Number of children: Not on file  . Years of education: Not on file  . Highest education level: Not on file  Occupational History  . Not on file  Social Needs  . Financial resource strain: Not on file  . Food insecurity:    Worry: Not on file    Inability: Not on file  . Transportation needs:    Medical: Not on file    Non-medical: Not on file  Tobacco Use  . Smoking status: Former Smoker    Packs/day: 0.25    Years: 25.00    Pack years: 6.25    Types: Cigarettes    Last attempt to quit: 01/17/1975    Years since quitting: 42.6  . Smokeless tobacco: Never Used  Substance and Sexual Activity  . Alcohol use: No    Alcohol/week: 0.0 oz  . Drug use: No  . Sexual activity: Not on file  Lifestyle  . Physical activity:    Days per week: Not on file    Minutes per session: Not on file  . Stress: Not on file  Relationships  . Social connections:    Talks on phone: Not on file    Gets together: Not on file    Attends religious service: Not on file    Active member of club or organization: Not on file    Attends meetings of clubs or organizations: Not on file    Relationship status: Not on file  . Intimate partner  violence:    Fear of current or ex partner: Not on file    Emotionally abused: Not on file    Physically abused: Not on file    Forced sexual activity: Not on file  Other Topics Concern  . Not on file  Social History Narrative   Retired [employer], 2 sons, 2 daughters   Bowls regularly.     BP 120/60   Pulse 70   Ht 5\' 5"  (1.651 m)   Wt 133 lb (60.3 kg)   BMI 22.13 kg/m   Physical Exam:  Well appearing elderly man, NAD HEENT: Unremarkable Neck:  No JVD, no thyromegally Lymphatics:  No adenopathy Back:  No CVA tenderness Lungs:  Clear with no wheezes HEART:  Regular rate rhythm, no murmurs, no rubs, no clicks Abd:  soft, positive bowel sounds, no organomegally, no rebound, no guarding Ext:  2 plus pulses, no edema, no cyanosis, no clubbing Skin:  No rashes no nodules Neuro:  CN II through XII intact, motor grossly intact  EKG  - atrial flutter with a controlled VR   Assess/Plan: 1. Atrial fltutter - he is symptomatic and not a good candidate for long term anti-coagulation. I have reviewed the indications/risks/benefits/goals/expectations of the procedure and he wishes to proceed. We will start systemic anti-coagulation and wait 3 weeks. He will hold the plavix.  2. CAD - he is s/p CABG and denies anginal symptoms.  3. HTN - his blood pressure is controlled. We will follow.  Mikle Bosworth.D.

## 2017-08-14 NOTE — H&P (View-Only) (Signed)
HPI Jacob Hampton is referred today by Dr. Wynonia Lawman for evaluation of atrial flutter. He is a pleasant elderly man with CAD, s/p CABG twice, last about 5 years ago. He has developed atrial flutter with a CVR associated with worsening fatigue. He has not had syncope. He does not have palpitations. He has been on plavix.  No Known Allergies   Current Outpatient Medications  Medication Sig Dispense Refill  . aspirin EC 81 MG tablet Take 81 mg by mouth daily.    Marland Kitchen atorvastatin (LIPITOR) 80 MG tablet Take 80 mg by mouth at bedtime.     . clopidogrel (PLAVIX) 75 MG tablet Take 75 mg by mouth daily.    . folic acid (FOLVITE) 1 MG tablet Take 1 mg by mouth daily.      . nitroGLYCERIN (NITROSTAT) 0.4 MG SL tablet Place 0.4 mg under the tongue every 5 (five) minutes as needed. For chest pain    . pantoprazole (PROTONIX) 40 MG tablet Take 40 mg by mouth daily.    . tamsulosin (FLOMAX) 0.4 MG CAPS capsule Take 0.4 mg by mouth.    . traMADol (ULTRAM) 50 MG tablet Take by mouth every 6 (six) hours as needed.     No current facility-administered medications for this visit.      Past Medical History:  Diagnosis Date  . Anemia   . Angina   . Arthritis   . Atrial flutter ([place])   . Blood transfusion    Had 8 units for GI bleed  . CAD (coronary artery disease)    Dr. Wynonia Lawman is his Cardiologist  . Dizzy    "when I take my blood pressure medicine sometimes it makes me a little dizzy"  . GERD (gastroesophageal reflux disease)   . Heart murmur   . History of primary tuberculosis   . History of upper GI bleeding    8 units for GI bleed   . Hyperlipidemia   . Hypertension   . Kidney stones   . Malignant neoplasm of upper lobe, unspecified bronchus or lung ([place])   . Pneumonia    h/o walking pneumonia at least 20 years ago  . Presence of aortocoronary bypass graft   . Presence of coronary angioplasty implant and graft   . Shortness of breath 05/18/11   "sometimes lying down; sometimes  w/activity"  . Sinus drainage    11/24/10 "every morning when I get up I have to clear it out"  . Sleep disorder    "trouble staying asleep"  . Tuberculosis 1969   Was in sanitorium for 1 year  . Ventricular premature depolarization     ROS:   All systems reviewed and negative except as noted in the HPI.   Past Surgical History:  Procedure Laterality Date  . APPENDECTOMY    . bleeding ulcer     w/gallbladder  . CATARACT EXTRACTION W/ INTRAOCULAR LENS IMPLANT     bilaterally  . CHOLECYSTECTOMY     "w/bleeding ulcer OR"  . CORONARY ANGIOPLASTY WITH STENT PLACEMENT  02/2011  . CORONARY ARTERY BYPASS GRAFT  03/25/1999   CABG x5 using LIMA to LAD, SVG to Diag, SVG to OM, sequential SVG to PD and RPL, open SV harvest from left thigh and leg  . CORONARY ARTERY BYPASS GRAFT  11/22/2010   Procedure: REDO CORONARY ARTERY BYPASS GRAFTING (CABG)TIMES TWO;  Surgeon: Rexene Alberts, MD;  Location: [facility];  Service: Open Heart Surgery;  Laterality: N/A;  using endoscopically  harvested right saphenous vein and left radial artery; Transesophageal echocardiogram  . INGUINAL HERNIA REPAIR     ? left  . LEFT HEART CATHETERIZATION WITH CORONARY/GRAFT ANGIOGRAM N/A 03/01/2011   Procedure: LEFT HEART CATHETERIZATION WITH Beatrix Fetters;  Surgeon: Jettie Booze, MD;  Location: [facility] CATH LAB;  Service: Cardiovascular;  Laterality: N/A;  . LEFT HEART CATHETERIZATION WITH CORONARY/GRAFT ANGIOGRAM N/A 05/19/2011   Procedure: LEFT HEART CATHETERIZATION WITH Beatrix Fetters;  Surgeon: Jettie Booze, MD;  Location: [facility] CATH LAB;  Service: Cardiovascular;  Laterality: N/A;  . LITHOTRIPSY    . OTHER SURGICAL HISTORY  03/25/1999   reexploration for bleeding s/p CABG - coagulopathy without mechanical source  . PARTIAL GASTRECTOMY    . PERCUTANEOUS CORONARY STENT INTERVENTION (PCI-S) N/A 03/02/2011   Procedure: PERCUTANEOUS CORONARY STENT INTERVENTION (PCI-S);  Surgeon: Jettie Booze,  MD;  Location: [facility] CATH LAB;  Service: Cardiovascular;  Laterality: N/A;  . PERCUTANEOUS CORONARY STENT INTERVENTION (PCI-S)  05/19/2011   Procedure: PERCUTANEOUS CORONARY STENT INTERVENTION (PCI-S);  Surgeon: Jettie Booze, MD;  Location: [facility] CATH LAB;  Service: Cardiovascular;;  . RADIAL ARTERY HARVEST  11/22/2010   Procedure: RADIAL ARTERY HARVEST;  Surgeon: Rexene Alberts, MD;  Location: [place];  Service: Open Heart Surgery;  Laterality: Left;  . Redo CABG  11/22/2010   CABG x2 with left radial to OM and SVG to RCA  . Right testicle surgery       Family History  Problem Relation Age of Onset  . Cancer Father   . Cancer Brother   . Stroke Brother      Social History   Socioeconomic History  . Marital status: Widowed    Spouse name: Not on file  . Number of children: Not on file  . Years of education: Not on file  . Highest education level: Not on file  Occupational History  . Not on file  Social Needs  . Financial resource strain: Not on file  . Food insecurity:    Worry: Not on file    Inability: Not on file  . Transportation needs:    Medical: Not on file    Non-medical: Not on file  Tobacco Use  . Smoking status: Former Smoker    Packs/day: 0.25    Years: 25.00    Pack years: 6.25    Types: Cigarettes    Last attempt to quit: 01/17/1975    Years since quitting: 42.6  . Smokeless tobacco: Never Used  Substance and Sexual Activity  . Alcohol use: No    Alcohol/week: 0.0 oz  . Drug use: No  . Sexual activity: Not on file  Lifestyle  . Physical activity:    Days per week: Not on file    Minutes per session: Not on file  . Stress: Not on file  Relationships  . Social connections:    Talks on phone: Not on file    Gets together: Not on file    Attends religious service: Not on file    Active member of club or organization: Not on file    Attends meetings of clubs or organizations: Not on file    Relationship status: Not on file  . Intimate partner  violence:    Fear of current or ex partner: Not on file    Emotionally abused: Not on file    Physically abused: Not on file    Forced sexual activity: Not on file  Other Topics Concern  . Not on file  Social History Narrative   Retired [employer], 2 sons, 2 daughters   Bowls regularly.     BP 120/60   Pulse 70   Ht 5\' 5"  (1.651 m)   Wt 133 lb (60.3 kg)   BMI 22.13 kg/m   Physical Exam:  Well appearing elderly man, NAD HEENT: Unremarkable Neck:  No JVD, no thyromegally Lymphatics:  No adenopathy Back:  No CVA tenderness Lungs:  Clear with no wheezes HEART:  Regular rate rhythm, no murmurs, no rubs, no clicks Abd:  soft, positive bowel sounds, no organomegally, no rebound, no guarding Ext:  2 plus pulses, no edema, no cyanosis, no clubbing Skin:  No rashes no nodules Neuro:  CN II through XII intact, motor grossly intact  EKG  - atrial flutter with a controlled VR   Assess/Plan: 1. Atrial fltutter - he is symptomatic and not a good candidate for long term anti-coagulation. I have reviewed the indications/risks/benefits/goals/expectations of the procedure and he wishes to proceed. We will start systemic anti-coagulation and wait 3 weeks. He will hold the plavix.  2. CAD - he is s/p CABG and denies anginal symptoms.  3. HTN - his blood pressure is controlled. We will follow.  Mikle Bosworth.D.

## 2017-08-15 ENCOUNTER — Telehealth: Payer: Self-pay

## 2017-08-15 LAB — CBC WITH DIFFERENTIAL/PLATELET
BASOS ABS: 0 10*3/uL (ref 0.0–0.2)
Basos: 0 %
EOS (ABSOLUTE): 0.2 10*3/uL (ref 0.0–0.4)
EOS: 3 %
HEMATOCRIT: 40.2 % (ref 37.5–51.0)
Hemoglobin: 12.5 g/dL — ABNORMAL LOW (ref 13.0–17.7)
IMMATURE GRANULOCYTES: 0 %
Immature Grans (Abs): 0 10*3/uL (ref 0.0–0.1)
LYMPHS ABS: 1.9 10*3/uL (ref 0.7–3.1)
LYMPHS: 31 %
MCH: 29.6 pg (ref 26.6–33.0)
MCHC: 31.1 g/dL — AB (ref 31.5–35.7)
MCV: 95 fL (ref 79–97)
MONOCYTES: 8 %
MONOS ABS: 0.5 10*3/uL (ref 0.1–0.9)
Neutrophils Absolute: 3.6 10*3/uL (ref 1.4–7.0)
Neutrophils: 58 %
PLATELETS: 192 10*3/uL (ref 150–450)
RBC: 4.22 x10E6/uL (ref 4.14–5.80)
RDW: 15.6 % — AB (ref 12.3–15.4)
WBC: 6.2 10*3/uL (ref 3.4–10.8)

## 2017-08-15 LAB — BASIC METABOLIC PANEL
BUN / CREAT RATIO: 17 (ref 10–24)
BUN: 18 mg/dL (ref 10–36)
CO2: 25 mmol/L (ref 20–29)
CREATININE: 1.09 mg/dL (ref 0.76–1.27)
Calcium: 9.1 mg/dL (ref 8.6–10.2)
Chloride: 105 mmol/L (ref 96–106)
GFR calc Af Amer: 68 mL/min/{1.73_m2} (ref >59)
GFR calc non Af Amer: 59 mL/min/{1.73_m2} — ABNORMAL LOW (ref >59)
Glucose: 91 mg/dL (ref 65–99)
Potassium: 4.8 mmol/L (ref 3.5–5.2)
Sodium: 142 mmol/L (ref 134–144)

## 2017-08-15 MED ORDER — APIXABAN 5 MG PO TABS: 5.0000 mg | ORAL_TABLET | Freq: Two times a day (BID) | ORAL | 0 refills | Status: DC

## 2017-08-15 NOTE — Telephone Encounter (Signed)
-----   Message from Evans Lance, MD sent at 08/15/2017  8:38 AM EDT ----- Now that his kidney function has improved, he will need 5 mg of Eliquis twice daily before and after his ablation.

## 2017-08-15 NOTE — Telephone Encounter (Signed)
Call placed to Pt.  Advised Pt should take Eliquis 5 mg BID when he starts on August 21, 2017.  Pt indicates understanding.  Verbalizes he can take 2 tablets of the 2.5 mg Eliquis twice a day until those run out.    Will leave 2 weeks worth of 5 mg Eliquis with new prescription at front desk for Pt to pick up.

## 2017-08-29 ENCOUNTER — Institutional Professional Consult (permissible substitution): Payer: Medicare Other | Admitting: Internal Medicine

## 2017-09-11 ENCOUNTER — Telehealth: Payer: Self-pay

## 2017-09-11 NOTE — Telephone Encounter (Signed)
Attempted outreach.  No answer.  No VM.

## 2017-09-12 ENCOUNTER — Other Ambulatory Visit: Payer: Self-pay

## 2017-09-12 ENCOUNTER — Ambulatory Visit (HOSPITAL_COMMUNITY)
Admission: RE | Admit: 2017-09-12 | Discharge: 2017-09-12 | Disposition: A | Discharge status: Home / Self Care | Payer: Medicare Other | Source: Ambulatory Visit | Attending: Internal Medicine | Admitting: Internal Medicine

## 2017-09-12 ENCOUNTER — Encounter (HOSPITAL_COMMUNITY): Payer: Self-pay | Admitting: Internal Medicine

## 2017-09-12 ENCOUNTER — Encounter (HOSPITAL_COMMUNITY)
Admission: RE | Disposition: A | Discharge status: Home / Self Care | Payer: Self-pay | Source: Ambulatory Visit | Attending: Internal Medicine

## 2017-09-12 DIAGNOSIS — Z7982 Long term (current) use of aspirin: Secondary | ICD-10-CM | POA: Diagnosis not present

## 2017-09-12 DIAGNOSIS — Z85118 Personal history of other malignant neoplasm of bronchus and lung: Secondary | ICD-10-CM | POA: Diagnosis not present

## 2017-09-12 DIAGNOSIS — Z951 Presence of aortocoronary bypass graft: Secondary | ICD-10-CM | POA: Diagnosis not present

## 2017-09-12 DIAGNOSIS — I483 Typical atrial flutter: Secondary | ICD-10-CM | POA: Insufficient documentation

## 2017-09-12 DIAGNOSIS — Z9049 Acquired absence of other specified parts of digestive tract: Secondary | ICD-10-CM | POA: Insufficient documentation

## 2017-09-12 DIAGNOSIS — Z79899 Other long term (current) drug therapy: Secondary | ICD-10-CM | POA: Insufficient documentation

## 2017-09-12 DIAGNOSIS — Z823 Family history of stroke: Secondary | ICD-10-CM | POA: Insufficient documentation

## 2017-09-12 DIAGNOSIS — Z955 Presence of coronary angioplasty implant and graft: Secondary | ICD-10-CM | POA: Diagnosis not present

## 2017-09-12 DIAGNOSIS — Z87442 Personal history of urinary calculi: Secondary | ICD-10-CM | POA: Diagnosis not present

## 2017-09-12 DIAGNOSIS — M1990 Unspecified osteoarthritis, unspecified site: Secondary | ICD-10-CM | POA: Diagnosis not present

## 2017-09-12 DIAGNOSIS — E785 Hyperlipidemia, unspecified: Secondary | ICD-10-CM | POA: Diagnosis not present

## 2017-09-12 DIAGNOSIS — Z9842 Cataract extraction status, left eye: Secondary | ICD-10-CM | POA: Diagnosis not present

## 2017-09-12 DIAGNOSIS — Z9889 Other specified postprocedural states: Secondary | ICD-10-CM | POA: Diagnosis not present

## 2017-09-12 DIAGNOSIS — I10 Essential (primary) hypertension: Secondary | ICD-10-CM | POA: Insufficient documentation

## 2017-09-12 DIAGNOSIS — Z903 Acquired absence of stomach [part of]: Secondary | ICD-10-CM | POA: Diagnosis not present

## 2017-09-12 DIAGNOSIS — Z809 Family history of malignant neoplasm, unspecified: Secondary | ICD-10-CM | POA: Insufficient documentation

## 2017-09-12 DIAGNOSIS — Z87891 Personal history of nicotine dependence: Secondary | ICD-10-CM | POA: Diagnosis not present

## 2017-09-12 DIAGNOSIS — I2510 Atherosclerotic heart disease of native coronary artery without angina pectoris: Secondary | ICD-10-CM | POA: Insufficient documentation

## 2017-09-12 DIAGNOSIS — Z7902 Long term (current) use of antithrombotics/antiplatelets: Secondary | ICD-10-CM | POA: Insufficient documentation

## 2017-09-12 DIAGNOSIS — Z9841 Cataract extraction status, right eye: Secondary | ICD-10-CM | POA: Diagnosis not present

## 2017-09-12 DIAGNOSIS — K219 Gastro-esophageal reflux disease without esophagitis: Secondary | ICD-10-CM | POA: Insufficient documentation

## 2017-09-12 DIAGNOSIS — I4892 Unspecified atrial flutter: Secondary | ICD-10-CM | POA: Diagnosis present

## 2017-09-12 HISTORY — PX: A-FLUTTER ABLATION: EP1230

## 2017-09-12 SURGERY — A-FLUTTER ABLATION

## 2017-09-12 MED ORDER — BUPIVACAINE HCL (PF) 0.25 % IJ SOLN
INTRAMUSCULAR | Status: AC
  Filled 2017-09-12: qty 30

## 2017-09-12 MED ORDER — MIDAZOLAM HCL 5 MG/5ML IJ SOLN
INTRAMUSCULAR | Status: DC | PRN
  Administered 2017-09-12 (×8): 1 mg via INTRAVENOUS

## 2017-09-12 MED ORDER — FENTANYL CITRATE (PF) 100 MCG/2ML IJ SOLN
INTRAMUSCULAR | Status: AC
  Filled 2017-09-12: qty 2

## 2017-09-12 MED ORDER — MIDAZOLAM HCL 5 MG/5ML IJ SOLN
INTRAMUSCULAR | Status: AC
  Filled 2017-09-12: qty 5

## 2017-09-12 MED ORDER — HEPARIN (PORCINE) IN NACL 1000-0.9 UT/500ML-% IV SOLN
INTRAVENOUS | Status: DC | PRN
  Administered 2017-09-12: 500 mL

## 2017-09-12 MED ORDER — SODIUM CHLORIDE 0.9 % IV SOLN
INTRAVENOUS | Status: DC
  Administered 2017-09-12: 08:00:00 via INTRAVENOUS

## 2017-09-12 MED ORDER — BUPIVACAINE HCL (PF) 0.25 % IJ SOLN
INTRAMUSCULAR | Status: DC | PRN
  Administered 2017-09-12: 45 mL

## 2017-09-12 MED ORDER — FENTANYL CITRATE (PF) 100 MCG/2ML IJ SOLN
INTRAMUSCULAR | Status: DC | PRN
  Administered 2017-09-12 (×8): 12.5 ug via INTRAVENOUS

## 2017-09-12 MED ORDER — HEPARIN (PORCINE) IN NACL 1000-0.9 UT/500ML-% IV SOLN
INTRAVENOUS | Status: AC
  Filled 2017-09-12: qty 500

## 2017-09-12 SURGICAL SUPPLY — 12 items
CATH JOSEPH QUAD ALLRED 6F REP (CATHETERS) ×3 IMPLANT
CATH POLARIS X REPROCESSED (CATHETERS) ×3 IMPLANT
CATH SMTCH THERMOCOOL SF FJ (CATHETERS) ×3 IMPLANT
CATH WEBSTER BI DIR CS D-F CRV (CATHETERS) ×3 IMPLANT
PACK EP LATEX FREE (CUSTOM PROCEDURE TRAY) ×2
PACK EP LF (CUSTOM PROCEDURE TRAY) ×1 IMPLANT
PAD PRO RADIOLUCENT 2001M-C (PAD) ×3 IMPLANT
PATCH CARTO3 (PAD) ×3 IMPLANT
SHEATH PINNACLE 6F 10CM (SHEATH) ×3 IMPLANT
SHEATH PINNACLE 7F 10CM (SHEATH) ×6 IMPLANT
SHEATH PINNACLE 8F 10CM (SHEATH) ×3 IMPLANT
TUBING SMART ABLATE COOLFLOW (TUBING) ×3 IMPLANT

## 2017-09-12 NOTE — Interval H&P Note (Signed)
History and Physical Interval Note:  09/12/2017 7:21 AM  Jacob Hampton  has presented today for surgery, with the diagnosis of A flutter  The various methods of treatment have been discussed with the patient and family. After consideration of risks, benefits and other options for treatment, the patient has consented to  Procedure(s): A-FLUTTER ABLATION (N/A) as a surgical intervention .  The patient's history has been reviewed, patient examined, no change in status, stable for surgery.  I have reviewed the patient's chart and labs.  Questions were answered to the patient's satisfaction.     Jacob Hampton

## 2017-09-12 NOTE — Discharge Instructions (Signed)
Post procedure care/activity instructions No driving for 4 days. No lifting over 5 lbs for 1 week. No vigorous or sexual activity for 1 week. You may return to work in 7 days. Keep procedure site clean & dry. If you notice increased pain, swelling, bleeding or pus, call/return!  You may shower, but no soaking baths/hot tubs/pools for 1 week.     Cardiac Ablation, Care After This sheet gives you information about how to care for yourself after your procedure. Your health care provider may also give you more specific instructions. If you have problems or questions, contact your health care provider. What can I expect after the procedure? After the procedure, it is common to have:  Bruising around your puncture site.  Tenderness around your puncture site.  Skipped heartbeats.  Tiredness (fatigue).  Follow these instructions at home: Puncture site care  Follow instructions from your health care provider about how to take care of your puncture site. Make sure you: ? Wash your hands with soap and water before you change your bandage (dressing). If soap and water are not available, use hand sanitizer. ? Change your dressing as told by your health care provider. ? Leave stitches (sutures), skin glue, or adhesive strips in place. These skin closures may need to stay in place for up to 2 weeks. If adhesive strip edges start to loosen and curl up, you may trim the loose edges. Do not remove adhesive strips completely unless your health care provider tells you to do that.  Check your puncture site every day for signs of infection. Check for: ? Redness, swelling, or pain. ? Fluid or blood. If your puncture site starts to bleed, lie down on your back, apply firm pressure to the area, and contact your health care provider. ? Warmth. ? Pus or a bad smell. Driving  Ask your health care provider when it is safe for you to drive again after the procedure.  Do not drive or use heavy machinery while  taking prescription pain medicine.  Do not drive for 24 hours if you were given a medicine to help you relax (sedative) during your procedure. Activity  Avoid activities that take a lot of effort for at least 3 days after your procedure.  Do not lift anything that is heavier than 10 lb (4.5 kg), or the limit that you are told, until your health care provider says that it is safe.  Return to your normal activities as told by your health care provider. Ask your health care provider what activities are safe for you. General instructions  Take over-the-counter and prescription medicines only as told by your health care provider.  Do not use any products that contain nicotine or tobacco, such as cigarettes and e-cigarettes. If you need help quitting, ask your health care provider.  Do not take baths, swim, or use a hot tub until your health care provider approves.  Do not drink alcohol for 24 hours after your procedure.  Keep all follow-up visits as told by your health care provider. This is important. Contact a health care provider if:  You have redness, mild swelling, or pain around your puncture site.  You have fluid or blood coming from your puncture site that stops after applying firm pressure to the area.  Your puncture site feels warm to the touch.  You have pus or a bad smell coming from your puncture site.  You have a fever.  You have chest pain or discomfort that spreads to your  neck, jaw, or arm.  You are sweating a lot.  You feel nauseous.  You have a fast or irregular heartbeat.  You have shortness of breath.  You are dizzy or light-headed and feel the need to lie down.  You have pain or numbness in the arm or leg closest to your puncture site. Get help right away if:  Your puncture site suddenly swells.  Your puncture site is bleeding and the bleeding does not stop after applying firm pressure to the area. These symptoms may represent a serious problem that  is an emergency. Do not wait to see if the symptoms will go away. Get medical help right away. Call your local emergency services (911 in the [place] Do not drive yourself to the hospital. Summary  After the procedure, it is normal to have bruising and tenderness at the puncture site in your groin, neck, or forearm.  Check your puncture site every day for signs of infection.  Get help right away if your puncture site is bleeding and the bleeding does not stop after applying firm pressure to the area. This is a medical emergency. This information is not intended to replace advice given to you by your health care provider. Make sure you discuss any questions you have with your health care provider. Document Released: 04/13/2016 Document Revised: 04/13/2016 Document Reviewed: 04/13/2016 Elsevier Interactive Patient Education  2018 [employer].

## 2017-09-12 NOTE — Progress Notes (Signed)
Site area: right groin 3 fv sheaths Site Prior to Removal:  Level 0 Pressure Applied For:  20 minutes Manual:   yes Patient Status During Pull:  stable Post Pull Site:  Level 0 Post Pull Instructions Given:  yes Post Pull Pulses Present:  Rt dp palpable Dressing Applied:   Gauze and tegaderm Bedrest begins @ 1110 Comments: IV saline locked

## 2017-09-18 ENCOUNTER — Other Ambulatory Visit: Payer: Self-pay | Admitting: *Deleted

## 2017-09-18 MED ORDER — APIXABAN 5 MG PO TABS: 5.0000 mg | ORAL_TABLET | Freq: Two times a day (BID) | ORAL | 3 refills | Status: DC

## 2017-09-18 NOTE — Telephone Encounter (Signed)
Pt last saw Dr Lovena Le 08/14/17, last labs 08/14/17 Creat 1.09, age 84, weight 60.3kg, Based on specified criteria pt is on appropriate dosage of Eliquis 5mg  BID.  However weight is [place] and has historically dipped below 60kg.  Will refill current rx x 3 months, then reassess dosage based on weight trend.

## 2017-09-18 NOTE — Telephone Encounter (Signed)
Patients son called and requested that a thirty day rx for eliquis be sent to [facility] so that they can use the free trial card.

## 2017-10-11 ENCOUNTER — Encounter: Payer: Self-pay | Admitting: Internal Medicine

## 2017-10-11 ENCOUNTER — Ambulatory Visit (INDEPENDENT_AMBULATORY_CARE_PROVIDER_SITE_OTHER): Payer: Medicare Other | Admitting: Internal Medicine

## 2017-10-11 VITALS — BP 112/60 | HR 84 | Ht 65.0 in | Wt 129.8 lb

## 2017-10-11 DIAGNOSIS — Z23 Encounter for immunization: Secondary | ICD-10-CM | POA: Diagnosis not present

## 2017-10-11 DIAGNOSIS — I4892 Unspecified atrial flutter: Secondary | ICD-10-CM

## 2017-10-11 NOTE — Progress Notes (Signed)
HPI Jacob Hampton returns today for evaluation s/p catheter ablation of atrial flutter. He is a pleasant elderly man with CAD, s/p CABG twice, last about 5 years ago. He has developed atrial flutter with a CVR associated with worsening fatigue. He has not had syncope. He does not have palpitations. He underwent EPS/RFA of atrial flutter a few weeks ago. In the interim, he has done well although it was discovered that he had kidney stones and is pending lithotripsy.  No Known Allergies   Current Outpatient Medications  Medication Sig Dispense Refill  . aspirin EC 81 MG tablet Take 81 mg by mouth at bedtime.    Marland Kitchen atorvastatin (LIPITOR) 80 MG tablet Take 80 mg by mouth at bedtime.     . clopidogrel (PLAVIX) 75 MG tablet Take 75 mg by mouth at bedtime.    Marland Kitchen ezetimibe (ZETIA) 10 MG tablet Take 10 mg by mouth at bedtime.  12  . folic acid (FOLVITE) 1 MG tablet Take 1 mg by mouth at bedtime.     . nitroGLYCERIN (NITROSTAT) 0.4 MG SL tablet Place 0.4 mg under the tongue every 5 (five) minutes as needed. For chest pain     No current facility-administered medications for this visit.      Past Medical History:  Diagnosis Date  . Anemia   . Angina   . Arthritis   . Atrial flutter ([place])   . Blood transfusion    Had 8 units for GI bleed  . CAD (coronary artery disease)    Dr. Wynonia Lawman is his Cardiologist  . Dizzy    "when I take my blood pressure medicine sometimes it makes me a little dizzy"  . GERD (gastroesophageal reflux disease)   . Heart murmur   . History of primary tuberculosis   . History of upper GI bleeding    8 units for GI bleed   . Hyperlipidemia   . Hypertension   . Kidney stones   . Malignant neoplasm of upper lobe, unspecified bronchus or lung ([place])   . Pneumonia    h/o walking pneumonia at least 20 years ago  . Presence of aortocoronary bypass graft   . Presence of coronary angioplasty implant and graft   . Shortness of breath 05/18/11   "sometimes lying down;  sometimes w/activity"  . Sinus drainage    11/24/10 "every morning when I get up I have to clear it out"  . Sleep disorder    "trouble staying asleep"  . Tuberculosis 1969   Was in sanitorium for 1 year  . Ventricular premature depolarization     ROS:   All systems reviewed and negative except as noted in the HPI.   Past Surgical History:  Procedure Laterality Date  . A-FLUTTER ABLATION N/A 09/12/2017   Procedure: A-FLUTTER ABLATION;  Surgeon: Evans Lance, MD;  Location: [facility] CV LAB;  Service: Cardiovascular;  Laterality: N/A;  . APPENDECTOMY    . bleeding ulcer     w/gallbladder  . CATARACT EXTRACTION W/ INTRAOCULAR LENS IMPLANT     bilaterally  . CHOLECYSTECTOMY     "w/bleeding ulcer OR"  . CORONARY ANGIOPLASTY WITH STENT PLACEMENT  02/2011  . CORONARY ARTERY BYPASS GRAFT  03/25/1999   CABG x5 using LIMA to LAD, SVG to Diag, SVG to OM, sequential SVG to PD and RPL, open SV harvest from left thigh and leg  . CORONARY ARTERY BYPASS GRAFT  11/22/2010   Procedure: REDO CORONARY ARTERY BYPASS GRAFTING (  CABG)TIMES TWO;  Surgeon: Rexene Alberts, MD;  Location: [facility];  Service: Open Heart Surgery;  Laterality: N/A;  using endoscopically harvested right saphenous vein and left radial artery; Transesophageal echocardiogram  . INGUINAL HERNIA REPAIR     ? left  . LEFT HEART CATHETERIZATION WITH CORONARY/GRAFT ANGIOGRAM N/A 03/01/2011   Procedure: LEFT HEART CATHETERIZATION WITH Beatrix Fetters;  Surgeon: Jettie Booze, MD;  Location: [facility] CATH LAB;  Service: Cardiovascular;  Laterality: N/A;  . LEFT HEART CATHETERIZATION WITH CORONARY/GRAFT ANGIOGRAM N/A 05/19/2011   Procedure: LEFT HEART CATHETERIZATION WITH Beatrix Fetters;  Surgeon: Jettie Booze, MD;  Location: [facility] CATH LAB;  Service: Cardiovascular;  Laterality: N/A;  . LITHOTRIPSY    . OTHER SURGICAL HISTORY  03/25/1999   reexploration for bleeding s/p CABG - coagulopathy without mechanical source    . PARTIAL GASTRECTOMY    . PERCUTANEOUS CORONARY STENT INTERVENTION (PCI-S) N/A 03/02/2011   Procedure: PERCUTANEOUS CORONARY STENT INTERVENTION (PCI-S);  Surgeon: Jettie Booze, MD;  Location: [facility] CATH LAB;  Service: Cardiovascular;  Laterality: N/A;  . PERCUTANEOUS CORONARY STENT INTERVENTION (PCI-S)  05/19/2011   Procedure: PERCUTANEOUS CORONARY STENT INTERVENTION (PCI-S);  Surgeon: Jettie Booze, MD;  Location: [facility] CATH LAB;  Service: Cardiovascular;;  . RADIAL ARTERY HARVEST  11/22/2010   Procedure: RADIAL ARTERY HARVEST;  Surgeon: Rexene Alberts, MD;  Location: [place];  Service: Open Heart Surgery;  Laterality: Left;  . Redo CABG  11/22/2010   CABG x2 with left radial to OM and SVG to RCA  . Right testicle surgery       Family History  Problem Relation Age of Onset  . Cancer Father   . Cancer Brother   . Stroke Brother      Social History   Socioeconomic History  . Marital status: Widowed    Spouse name: Not on file  . Number of children: Not on file  . Years of education: Not on file  . Highest education level: Not on file  Occupational History  . Not on file  Social Needs  . Financial resource strain: Not on file  . Food insecurity:    Worry: Not on file    Inability: Not on file  . Transportation needs:    Medical: Not on file    Non-medical: Not on file  Tobacco Use  . Smoking status: Former Smoker    Packs/day: 0.25    Years: 25.00    Pack years: 6.25    Types: Cigarettes    Last attempt to quit: 01/17/1975    Years since quitting: 42.7  . Smokeless tobacco: Never Used  Substance and Sexual Activity  . Alcohol use: No    Alcohol/week: 0.0 standard drinks  . Drug use: No  . Sexual activity: Not on file  Lifestyle  . Physical activity:    Days per week: Not on file    Minutes per session: Not on file  . Stress: Not on file  Relationships  . Social connections:    Talks on phone: Not on file    Gets together: Not on file    Attends religious  service: Not on file    Active member of club or organization: Not on file    Attends meetings of clubs or organizations: Not on file    Relationship status: Not on file  . Intimate partner violence:    Fear of current or ex partner: Not on file    Emotionally abused: Not on file  Physically abused: Not on file    Forced sexual activity: Not on file  Other Topics Concern  . Not on file  Social History Narrative   Retired [employer], 2 sons, 2 daughters   Bowls regularly.     BP 112/60   Pulse 84   Ht 5\' 5"  (1.651 m)   Wt 129 lb 12.8 oz (58.9 kg)   SpO2 98%   BMI 21.60 kg/m   Physical Exam:  Well appearing NAD HEENT: Unremarkable Neck:  No JVD, no thyromegally Lymphatics:  No adenopathy Back:  No CVA tenderness Lungs:  Clear with no wheezes HEART:  Regular rate rhythm, no murmurs, no rubs, no clicks Abd:  soft, positive bowel sounds, no organomegally, no rebound, no guarding Ext:  2 plus pulses, no edema, no cyanosis, no clubbing Skin:  No rashes no nodules Neuro:  CN II through XII intact, motor grossly intact  EKG - nsr with RBBB  Assess/Plan: 1. Atrial flutter - he is s/p EP study and ablation and is maintaining NSR very nicely.  2. CAD - he denies anginal symptoms. 3. Renal stones - he is pending lithotripsy. He may proceed. He will be off of his Eliquis by the time he has the procedure done.  Mikle Bosworth.D.

## 2017-10-11 NOTE — Telephone Encounter (Signed)
This encounter was created in error - please disregard.

## 2017-10-11 NOTE — Patient Instructions (Addendum)
Medication Instructions:  Your physician has recommended you make the following change in your medication:   1.  Stop taking Eliquis  Labwork: None ordered.  Testing/Procedures: None ordered.  Follow-Up: Your physician wants you to follow-up in: as needed with Dr. Lovena Le.      Any Other Special Instructions Will Be Listed Below (If Applicable).  If you need a refill on your cardiac medications before your next appointment, please call your pharmacy.

## 2017-10-12 ENCOUNTER — Ambulatory Visit: Payer: Medicare Other | Admitting: Internal Medicine

## 2017-10-17 ENCOUNTER — Telehealth: Payer: Self-pay

## 2017-10-17 NOTE — Telephone Encounter (Signed)
   [facility] Pre-operative Risk Assessment    Request for surgical clearance:  1. What type of surgery is being performed?  Lithrotripsy   2. When is this surgery scheduled?  11/01/17   3. What type of clearance is required (medical clearance vs. Pharmacy clearance to hold med vs. Both)? both  4. Are there any medications that need to be held prior to surgery and how long?    5. Practice name and name of physician performing surgery?  [facility]/ Dr Thomasene Mohair   6. What is your office phone number [phone]    7.   What is your office fax number [phone]  8.   Anesthesia type (None, local, MAC, general) ?    Jacob Hampton  Jacob Hampton 10/17/2017, 10:33 AM  _________________________________________________________________   (provider comments below)

## 2017-10-18 NOTE — Telephone Encounter (Signed)
   Primary Cardiologist: Dr. Wynonia Lawman Dr. Lovena Le   Chart reviewed as part of pre-operative protocol coverage. Given past medical history and time since last visit, based on ACC/AHA guidelines, Jacob Hampton would be at acceptable risk for the planned procedure without further cardiovascular testing.   Pt recently seen by Dr.  Lovena Le 10/11/17 and was cleared for lithotripsy. Per Dr. Tanna Furry note, "Renal stones - he is pending lithotripsy. He may proceed. He will be off of his Eliquis by the time he has the procedure done". Eliquis was discontinued at [facility].   I will route this recommendation to the requesting party via Epic fax function and remove from pre-op pool.  Please call with questions.  Lyda Jester, PA-C 10/18/2017, 4:05 PM

## 2017-10-19 ENCOUNTER — Encounter: Payer: Self-pay | Admitting: Internal Medicine

## 2017-10-19 ENCOUNTER — Ambulatory Visit (INDEPENDENT_AMBULATORY_CARE_PROVIDER_SITE_OTHER)
Admission: RE | Admit: 2017-10-19 | Discharge: 2017-10-19 | Disposition: A | Discharge status: Home / Self Care | Payer: Medicare Other | Source: Ambulatory Visit | Attending: Internal Medicine | Admitting: Internal Medicine

## 2017-10-19 ENCOUNTER — Ambulatory Visit (INDEPENDENT_AMBULATORY_CARE_PROVIDER_SITE_OTHER): Payer: Medicare Other | Admitting: Internal Medicine

## 2017-10-19 VITALS — BP 100/56 | HR 80 | Ht 65.0 in | Wt 129.8 lb

## 2017-10-19 DIAGNOSIS — R911 Solitary pulmonary nodule: Secondary | ICD-10-CM | POA: Diagnosis not present

## 2017-10-19 NOTE — Patient Instructions (Signed)
Please remember to go to the  x-ray department downstairs in the basement  for your tests - we will call you with the results when they are available.      Please schedule a follow up visit in  6 months but call sooner if needed. 

## 2017-10-19 NOTE — Progress Notes (Signed)
ATC, NA and no option to leave msg, WCB

## 2017-10-19 NOTE — Progress Notes (Signed)
Subjective:    Patient ID: Jacob Hampton, male   DOB: 1924-05-24    MRN: [id number]    Brief patient profile:  89  yowm quit smoking in 1977  referred to pulmonary clinic 05/20/2015 by Dr Wynonia Lawman for abn cxr    History of Present Illness  05/20/2015 1st [facility] office visit/ Jacob Hampton   Chief Complaint  Patient presents with  . Pulmonary Consult    Referred by Dr. Tollie Eth for eval of abnormal cxr. Pt c/o DOE with minimal exertion and also unintended weight loss over the past few months.   doe x one month indolent onset/ progressive = MMRC3 = can't walk 100 yards even at a slow pace at a flat grade s stopping due to sob with gen weakness and wt loss - was having non-pleuritic L cp but this has resolved/ appetite poor s GI symptoms though has been having renal colic related to stones under eval by urology  rec PET done 05/27/15 isolated dz LLL       04/05/2016  f/u ov/Jacob Hampton re:  spn LLL sup segment medially  Chief Complaint  Patient presents with  . Follow-up    Pt states overall doing well besides some left shoulder pain that started 2 wks ago.    ? Acute onset L post cp 24/7 no fluctuation/  not pleuritic/positional and no rash or problem using L arm  rec Please remember to go to the  x-ray department downstairs in the basement  for your tests - we will call you with the results when they are available. For pain tylenol or vicdin (norco) one every 4 hours as needed > only took a few      07/07/2016  f/u ov/Jacob Hampton re:  Spn/ no resp or pain meds needed  Chief Complaint  Patient presents with  . Follow-up    Breathing is doing well. No new co's.   Not limited by breathing from desired activities  / no chest or shoulder pain rec F/u one year   07/11/2017  f/u ov/Jacob Hampton re: spn  Chief Complaint  Patient presents with  . Follow-up    weight loss about 20lbs since last ov.  Dyspnea:  Still going to mb and back but struggles a bit with doe back to house  Cough: no SABA use:  none 02: no No cp / poor appeitite since kidney stone pain  but it has resolved and was not localized to L lower chest  On the other hand appetite has not recovered since pain resolved and not requiring analgesics now  Likes fried flounder but "my heart doctor won't let me eat it due to my cholesterol" on lipitor 80  rec Consider a lower dose of lipitor per Dr Wynonia Lawman  Eat more fried fish and hush puppy's if that's what you like and ensure afterwards  Please schedule a follow up visit in 3 months but call sooner if needed with cxr on return     10/19/2017  f/u ov/Jacob Hampton re: spn/ chronic orthostasis  Chief Complaint  Patient presents with  . Follow-up    gained 7 lbs since the last visit. He states he stays dizzy all the time.   Dyspnea:  Not limited by breathing from desired activities  / still mb/ still bowling/ still driving Cough: no cough Sleeping: flat bed/ one pillow SABA use: none 02: none Only dizzy if stands, never sitting or lying down     No obvious day to day or daytime variability or  assoc excess/ purulent sputum or mucus plugs or hemoptysis or cp or chest tightness, subjective wheeze or overt sinus or hb symptoms.   Sleeping fine as above without nocturnal  or early am exacerbation  of respiratory  c/o's or need for noct saba. Also denies any obvious fluctuation of symptoms with weather or environmental changes or other aggravating or alleviating factors except as outlined above   No unusual exposure hx or h/o childhood pna/ asthma or knowledge of premature birth.  Current Allergies, Complete Past Medical History, Past Surgical History, Family History, and Social History were reviewed in [employer] record.  ROS  The following are not active complaints unless bolded Hoarseness, sore throat, dysphagia, dental problems, itching, sneezing,  nasal congestion or discharge of excess mucus or purulent secretions, ear ache,   fever, chills, sweats,  unintended wt loss or wt gain, classically pleuritic or exertional cp,  orthopnea pnd or arm/hand swelling  or leg swelling, presyncope, palpitations, abdominal pain, anorexia, nausea, vomiting, diarrhea  or change in bowel habits or change in bladder habits, change in stools or change in urine, dysuria, hematuria,  rash, arthralgias, visual complaints, headache, numbness, weakness or ataxia or problems with walking related to orthostasis or coordination,  change in mood or  memory.        Current Meds  Medication Sig  . aspirin EC 81 MG tablet Take 81 mg by mouth at bedtime.  Marland Kitchen atorvastatin (LIPITOR) 80 MG tablet Take 80 mg by mouth at bedtime.   . clopidogrel (PLAVIX) 75 MG tablet Take 75 mg by mouth at bedtime.  Marland Kitchen ezetimibe (ZETIA) 10 MG tablet Take 10 mg by mouth at bedtime.  . folic acid (FOLVITE) 1 MG tablet Take 1 mg by mouth at bedtime.   . nitroGLYCERIN (NITROSTAT) 0.4 MG SL tablet Place 0.4 mg under the tongue every 5 (five) minutes as needed. For chest pain               Objective:   Physical Exam    amb wm walks with bent knees   10/19/2017        129  07/11/2017       123  07/07/2016       144  04/05/2016       145  10/05/2015       140  07/05/2015       139   05/20/15 142 lb 3.2 oz (64.501 kg)  05/09/15 143 lb 7 oz (65.063 kg)  05/20/11 156 lb 4.9 oz (70.9 kg)    Vital signs reviewed - Note on arrival 02 sats  98% on RA       HEENT: nl  turbinates bilaterally, and oropharynx. Nl external ear canals without cough reflex - edentulous   NECK :  without JVD/Nodes/TM/ nl carotid upstrokes bilaterally   LUNGS: no acc muscle use,  Nl contour chest which is clear to A and P bilaterally without cough on insp or exp maneuvers   CV:  RRR  no s3 or murmur or increase in P2, and no edema   ABD:  soft and nontender with nl inspiratory excursion in the supine position. No bruits or organomegaly appreciated, bowel sounds nl  MS:  Nl gait/ ext warm with muscle wasting  apparent but no calf tenderness, cyanosis or clubbing No obvious joint restrictions   SKIN: warm and dry without lesions    NEURO:  alert, approp, nl sensorium with  no motor or cerebellar deficits apparent.  CXR PA and Lateral:   10/19/2017 :    I personally reviewed images and agree with radiology impression as follows:    Rounded left retrocardiac masslike opacity noted, corresponding to the nodule seen on prior CT. It is difficult to determine stability given the different modalities, but may have enlarged slightly, now measuring 2.9 cm by plain film compared to 2.6 cm by CT.           Assessment:

## 2017-10-21 ENCOUNTER — Encounter: Payer: Self-pay | Admitting: Internal Medicine

## 2017-10-21 NOTE — Assessment & Plan Note (Signed)
Incidentally detected 05/09/15 x LLL medially x 23mm  PET 05/27/2015 >  1. Hypermetabolic nodule in the LEFT lower lobe consistent with primary bronchogenic carcinoma. 2. No evidence mediastinal nodal metastasis or distant metastasis  - see ov 07/05/2015 > declined surgical opinion  - 10/05/2015 not viz on plain cxr > neg cxr 04/05/2016  And 07/07/2016   -    CT renal 06/13/17  LLL nodule 2.6 x 2.1 with 1.0 cm satellite lesion   Overview:  Overview:  Incidentally detected 05/09/15 x LLL medially x 61mm  PET 05/27/2015 >  1. Hypermetabolic nodule in the LEFT lower lobe consistent with primary bronchogenic carcinoma. 2. No evidence mediastinal nodal metastasis or distant metastasis  - see ov 07/05/2015 > declined surgical opinion  - 10/05/2015 not viz on plain cxr > neg cxr 04/05/2016  And 07/07/2016 > rec f/u yearly unless symptoms   Last Assessment & Plan:  Incidentally detected 05/09/15 x LLL medially x 75mm  PET 05/27/2015 >  1. Hypermetabolic nodule in the LEFT lower lobe consistent with primary bronchogenic carcinoma. 2. No evidence mediastinal nodal metastasis or distant metastasis  - see ov 07/05/2015 > declined surgical opinion  - 10/05/2015 not viz on plain cxr > neg cxr 04/05/2016  And 07/07/2016 > rec f/u yearly unless symptoms   Studies reviewed/ pt declines w/u but might be convinced re accepting palliative rt if any symptoms develop requiring palliation eg hemoptysis, cp  Discussed in detail all the  indications, usual  risks and alternatives  relative to the benefits with patient who agrees to proceed with conservative f/u as outlined  = cxr q 6 m   I had an extended discussion with the patient reviewing all relevant studies completed to date and  lasting 15 to 20 minutes of a 25 minute visit    Each maintenance medication was reviewed in detail including most importantly the difference between maintenance and prns and under what circumstances the prns are to be triggered using an  action plan format that is not reflected in the computer generated alphabetically organized AVS.     Please see AVS for specific instructions unique to this visit that I personally wrote and verbalized to the the pt in detail and then reviewed with pt  by my nurse highlighting any  changes in therapy recommended at today's visit to their plan of care.

## 2017-10-22 NOTE — Progress Notes (Signed)
Spoke with pt and notified of results per Dr. Wert. Pt verbalized understanding and denied any questions. 

## 2017-10-26 ENCOUNTER — Ambulatory Visit (INDEPENDENT_AMBULATORY_CARE_PROVIDER_SITE_OTHER): Payer: Medicare Other | Admitting: Cardiology

## 2017-10-26 ENCOUNTER — Encounter: Payer: Self-pay | Admitting: Cardiology

## 2017-10-26 VITALS — BP 120/64 | HR 88 | Ht 65.0 in | Wt 133.0 lb

## 2017-10-26 DIAGNOSIS — Z951 Presence of aortocoronary bypass graft: Secondary | ICD-10-CM

## 2017-10-26 DIAGNOSIS — I483 Typical atrial flutter: Secondary | ICD-10-CM

## 2017-10-26 DIAGNOSIS — I2510 Atherosclerotic heart disease of native coronary artery without angina pectoris: Secondary | ICD-10-CM | POA: Diagnosis not present

## 2017-10-26 NOTE — Progress Notes (Signed)
Cardiology Office Note:    Date:  10/26/2017   ID:  Jacob Hampton, DOB 05-20-1924, MRN [id number]  PCP:  Patient, No Pcp Per  Cardiologist:  Jenne Campus, MD    Referring MD: No ref. provider found   Chief Complaint  Patient presents with  . Pre-op Exam    Lithotripsy with Dr. Thomasene Mohair  I am doing well  History of Present Illness:    Jacob Hampton is a 84 y.o. male with multiple medical problems that include coronary artery disease, status post coronary artery bypass graft x2, status post angioplasties, hyperlipidemia, dizziness, recent history of atrial flutter with cardioversion to sinus rhythm.  He is being excellently managed by his cardiologist Dr. Wynonia Lawman.  Overall he is doing well denies have any chest pain tightness squeezing pressure burning chest the leading complaint is dizziness that he suffered from 4 years quite extensive evaluation has been done and being unrevealing it looks like it somewhat positional also dizziness is worse when he turns his head.  In spite of that he is fully functional he still drives there is no history of passing out.  There is no chest pain tightness squeezing pressure burning chest.  He did have lithotripsy done few months ago before lithotripsy he was find to have atrial flutter he was converted with cardioversion now maintaining sinus rhythm he is not anticoagulated because of history of significant GI bleeding he is on aspirin Plavix.  Past Medical History:  Diagnosis Date  . Anemia   . Angina   . Arthritis   . Atrial flutter ([place])   . Blood transfusion    Had 8 units for GI bleed  . CAD (coronary artery disease)    Dr. Wynonia Lawman is his Cardiologist  . Dizzy    "when I take my blood pressure medicine sometimes it makes me a little dizzy"  . GERD (gastroesophageal reflux disease)   . Heart murmur   . History of primary tuberculosis   . History of upper GI bleeding    8 units for GI bleed   . Hyperlipidemia   . Hypertension   . Kidney  stones   . Malignant neoplasm of upper lobe, unspecified bronchus or lung ([place])   . Pneumonia    h/o walking pneumonia at least 20 years ago  . Presence of aortocoronary bypass graft   . Presence of coronary angioplasty implant and graft   . Shortness of breath 05/18/11   "sometimes lying down; sometimes w/activity"  . Sinus drainage    11/24/10 "every morning when I get up I have to clear it out"  . Sleep disorder    "trouble staying asleep"  . Tuberculosis 1969   Was in sanitorium for 1 year  . Ventricular premature depolarization     Past Surgical History:  Procedure Laterality Date  . A-FLUTTER ABLATION N/A 09/12/2017   Procedure: A-FLUTTER ABLATION;  Surgeon: Evans Lance, MD;  Location: [facility];  Service: Cardiovascular;  Laterality: N/A;  . APPENDECTOMY    . bleeding ulcer     w/gallbladder  . CATARACT EXTRACTION W/ INTRAOCULAR LENS IMPLANT     bilaterally  . CHOLECYSTECTOMY     "w/bleeding ulcer OR"  . CORONARY ANGIOPLASTY WITH STENT PLACEMENT  02/2011  . CORONARY ARTERY BYPASS GRAFT  03/25/1999   CABG x5 using LIMA to LAD, SVG to Diag, SVG to OM, sequential SVG to PD and RPL, open SV harvest from left thigh and leg  . CORONARY ARTERY BYPASS GRAFT  11/22/2010   Procedure: REDO CORONARY ARTERY BYPASS GRAFTING (CABG)TIMES TWO;  Surgeon: Rexene Alberts, MD;  Location: [facility];  Service: Open Heart Surgery;  Laterality: N/A;  using endoscopically harvested right saphenous vein and left radial artery; Transesophageal echocardiogram  . INGUINAL HERNIA REPAIR     ? left  . LEFT HEART CATHETERIZATION WITH CORONARY/GRAFT ANGIOGRAM N/A 03/01/2011   Procedure: LEFT HEART CATHETERIZATION WITH Beatrix Fetters;  Surgeon: Jettie Booze, MD;  [facility] C[facility] LAB;  Service: Cardiovascular;  Laterality: N/A;  . LEFT HEART CATHETERIZATION WITH CORONARY/GRAFT ANGIOGRAM N/A 05/19/2011   Procedure: LEFT HEART CATHETERIZATION WITH Beatrix Fetters;  Surgeon:  Jettie Booze, MD;  Location: [facility] C[facility] LAB;  Service: Cardiovascular;  Laterality: N/A;  . LITHOTRIPSY    . OTHER SURGICAL HISTORY  03/25/1999   reexploration for bleeding s/p CABG - coagulopathy without mechanical source  . PARTIAL GASTRECTOMY    . PERCUTANEOUS CORONARY STENT INTERVENTION (PCI-S) N/A 03/02/2011   Procedure: PERCUTANEOUS CORONARY STENT INTERVENTION (PCI-S);  Surgeon: Jettie Booze, MD;  Location: [facility] C[facility] LAB;  Service: Cardiovascular;  Laterality: N/A;  . PERCUTANEOUS CORONARY STENT INTERVENTION (PCI-S)  05/19/2011   Procedure: PERCUTANEOUS CORONARY STENT INTERVENTION (PCI-S);  Surgeon: Jettie Booze, MD;  Location: [facility] CATH LAB;  Service: Cardiovascular;;  . RADIAL ARTERY HARVEST  11/22/2010   Procedure: RADIAL ARTERY HARVEST;  Surgeon: Rexene Alberts, MD;  Location: [facility];  Service: Open Heart Surgery;  Laterality: Left;  . Redo CABG  11/22/2010   CABG x2 with left radial to OM and SVG to RCA  . Right testicle surgery      Current Medications: Current Meds  Medication Sig  . aspirin EC 81 MG tablet Take 81 mg by mouth at bedtime.  Marland Kitchen atorvastatin (LIPITOR) 80 MG tablet Take 80 mg by mouth at bedtime.   . clopidogrel (PLAVIX) 75 MG tablet Take 75 mg by mouth at bedtime.  Marland Kitchen ezetimibe (ZETIA) 10 MG tablet Take 10 mg by mouth at bedtime.  . folic acid (FOLVITE) 1 MG tablet Take 1 mg by mouth at bedtime.   . nitroGLYCERIN (NITROSTAT) 0.4 MG SL tablet Place 0.4 mg under the tongue every 5 (five) minutes as needed. For chest pain     Allergies:   Patient has no known allergies.   Social History   Socioeconomic History  . Marital status: Widowed    Spouse name: Not on file  . Number of children: Not on file  . Years of education: Not on file  . Highest education level: Not on file  Occupational History  . Not on file  Social Needs  . Financial resource strain: Not on file  . Food insecurity:    Worry: Not on file    Inability: Not on file  .  Transportation needs:    Medical: Not on file    Non-medical: Not on file  Tobacco Use  . Smoking status: Former Smoker    Packs/day: 0.25    Years: 25.00    Pack years: 6.25    Types: Cigarettes    Last attempt to quit: 01/17/1975    Years since quitting: 42.8  . Smokeless tobacco: Never Used  Substance and Sexual Activity  . Alcohol use: No    Alcohol/week: 0.0 standard drinks  . Drug use: No  . Sexual activity: Not on file  Lifestyle  . Physical activity:    Days per week: Not on file    Minutes per session: Not on  file  . Stress: Not on file  Relationships  . Social connections:    Talks on phone: Not on file    Gets together: Not on file    Attends religious service: Not on file    Active member of club or organization: Not on file    Attends meetings of clubs or organizations: Not on file    Relationship status: Not on file  Other Topics Concern  . Not on file  Social History Narrative   Retired [employer], 2 sons, 2 daughters   Bowls regularly.     Family History: The patient's family history includes Alzheimer's disease in his sister; Bowel Disease in his brother; Cancer in his brother and father; Heart attack in his sister; Stroke in his brother; Unexplained death in his brother and mother. ROS:   Please see the history of present illness.    All 14 point review of systems negative except as described per history of present illness  EKGs/Labs/Other Studies Reviewed:      Recent Labs: 03/24/2017: ALT 53 08/14/2017: BUN 18; Creatinine, Ser 1.09; Hemoglobin 12.5; Platelets 192; Potassium 4.8; Sodium 142  Recent Lipid Panel    Component Value Date/Time   CHOL 117 03/01/2011 0531   TRIG 68 03/01/2011 0531   HDL 42 03/01/2011 0531   CHOLHDL 2.8 03/01/2011 0531   VLDL 14 03/01/2011 0531   LDLCALC 61 03/01/2011 0531    Physical Exam:    VS:  BP 120/64   Pulse 88   Ht 5\' 5"  (1.651 m)   Wt 133 lb (60.3 kg)   SpO2 93%   BMI 22.13  kg/m     Wt Readings from Last 3 Encounters:  10/26/17 133 lb (60.3 kg)  10/19/17 129 lb 12.8 oz (58.9 kg)  10/11/17 129 lb 12.8 oz (58.9 kg)     GEN:  Well nourished, well developed in no acute distress HEENT: Normal NECK: No JVD; No carotid bruits LYMPHATICS: No lymphadenopathy CARDIAC: RRR, no murmurs, no rubs, no gallops RESPIRATORY:  Clear to auscultation without rales, wheezing or rhonchi  ABDOMEN: Soft, non-tender, non-distended MUSCULOSKELETAL:  No edema; No deformity  SKIN: Warm and dry LOWER EXTREMITIES: no swelling NEUROLOGIC:  Alert and oriented x 3 PSYCHIATRIC:  Normal affect   ASSESSMENT:    1. Coronary artery disease involving native coronary artery of native heart without angina pectoris   2. Typical atrial flutter ([place])   3. History of CABG   4. Presence of aortocoronary bypass graft    PLAN:    In order of problems listed above:  1. Coronary artery disease status post coronary artery bypass graft x2 doing well asymptomatic we will continue present management. 2. History of paroxysmal typical atrial flutter.  He is maintaining sinus rhythm today EKG showed sinus rhythm with right bundle branch block as before.  He is not anticoagulated because of history of GI bleed advanced age.  He is on aspirin Plavix 3. Dyslipidemia.  Cholesterol is excellent we will continue present management.  Overall he is doing very well.  He is scheduled to have lithotripsy and there should be no objection from cardiac standpoint to be to do it obviously he need to be monitoring times of his heart rhythm   Medication Adjustments/Labs and Tests Ordered: Current medicines are reviewed at length with the patient today.  Concerns regarding medicines are outlined above.  No orders of the defined types were placed in this encounter.  Medication changes: No orders of the  defined types were placed in this encounter.   Signed, Park Liter, MD, [facility] 10/26/2017 10:33 AM      [place]

## 2017-10-26 NOTE — Patient Instructions (Signed)
Medication Instructions:  Your physician recommends that you continue on your current medications as directed. Please refer to the Current Medication list given to you today.  If you need a refill on your cardiac medications before your next appointment, please call your pharmacy.   Lab work: None. If you have labs (blood work) drawn today and your tests are completely normal, you will receive your results only by: Marland Kitchen MyChart Message (if you have MyChart) OR . A paper copy in the mail If you have any lab test that is abnormal or we need to change your treatment, we will call you to review the results.  Testing/Procedures: None   Follow-Up: At [facility], you and your health needs are our priority.  As part of our continuing mission to provide you with exceptional heart care, we have created designated Provider Care Teams.  These Care Teams include your primary Cardiologist (physician) and Advanced Practice Providers (APPs -  Physician Assistants and Nurse Practitioners) who all work together to provide you with the care you need, when you need it. You will need a follow up appointment in 5 months.  Please call our office 2 months in advance to schedule this appointment.  You may see No primary care provider on file. or another member of our [employer] Provider Team in [place]: Shirlee More, MD . Jyl Heinz, MD  Any Other Special Instructions Will Be Listed Below (If Applicable).

## 2017-10-30 NOTE — Progress Notes (Signed)
Jacob Hampton    Date of visit:  08/01/2017 DOB:  April 30, 1924    Age:  84 yrs. Medical record number:  [id number]     Account number:  [id number] Primary Care Provider: Margy Clarks ____________________________ CURRENT DIAGNOSES  1. CAD Native without angina  2. Atrial flutter, typical  3. Presence of aortocoronary bypass graft  4. Presence of coronary angioplasty implant and graft  5. Peripheral vascular disease, unspecified  6. Ventricular premature depolarization  7. Malignant Neoplasm Of Upper Lobe, Unspecified Bronchus Or Lung  8. Hyperlipidemia  9. Dizziness and giddiness  10. Other long term (current) drug therapy  11. Personal history of tuberculosis  12. Personal history of peptic ulcer disease ____________________________ ALLERGIES  No Known Allergies ____________________________ MEDICATIONS  1. aspirin 81 mg chewable tablet, 1 p.o. daily  2. atorvastatin 80 mg tablet, 1 p.o. daily  3. clopidogrel 75 mg tablet, 1 p.o. daily  4. ezetimibe 10 mg tablet, 1 p.o. daily  5. folic acid 1 mg tablet, 1 p.o. q.d.  6. nitroglycerin 0.4 mg sublingual tablet, PRN  7. oxycodone-acetaminophen 5 mg-325 mg tablet, PRN ____________________________ CHIEF COMPLAINTS  Followup of Atrial flutter, typical ____________________________ HISTORY OF PRESENT ILLNESS Patient returns for cardiac followup. He was able to have his lithotripsy for his renal stones and also had to have a cystoscopy and extraction. He has been able to return to bowling but has episodic spells of dizziness. The dizziness has been evaluated through the years with event monitoring. Most recently on the lithotripsy he was found to be in atrial flutter. He has a history of redo bypass grafting as well as stenting of the vein graft previously. He currently has no angina. When he was diagnosed with atrial flutter we wanted him to go ahead and have the lithotripsy and then we would deal with atrial flutter later. It is  pertinent that he has a prior history of a GI bleed requiring at least 8 units of packed cells previously. For this reason we thought he would be at high risk of anticoagulation. He is quite functional although he is 49. He and his son were back today and we talked about the management of atrial flutter. I had thought originally that it might be reasonable to try a short course of anticoagulation consider ablation of the atrial flutter circuit and then perhaps getting off of anticoagulation at that point. ____________________________ PAST HISTORY  Past Medical Illnesses:  hyperlipidemia, history of tuberculosis, history of peptic ulcer disease;  Cardiovascular Illnesses:  CAD, dilated left subclavian  artery, atrial flutter;  Surgical Procedures:  CABG w LIMA to LAD, SVG to dx, SVG to OM, SVg to PD-PL 03/25/99 Dr. Roxy Manns, herniorrhaphy, R. testicle surgery, gastrectomy-subtotal, Redo CABG 11/12 Dr. Roxy Manns left radial to OM, SVG to RCA;  NYHA Classification:  I;  Canadian Angina Classification:  Class 0: Asymptomatic;  Cardiology Procedures-Invasive:  PTCA of the RCA 1988, PTCA of the RCA 1991, cardiac cath (left) May 2013, BMS stent 2013 to prox RCA and ostium of radial graft to OM, Promus stent ostium of radial graft for ISR May 2013;  Cardiology Procedures-Noninvasive:  echocardiogram May 2019;  Cardiac Cath Results:  99% stenosis distal Left main, occluded LAD, widely patent stent RCA, mid 70%, 70% stenosis mid RCA, occluded RCA SVG, ostial ISR stenosis of radial graft to OM, occluded Diag 1 SVG, widely patent LAD LIMA graft;  Peripheral Vascular Procedures:  CT scan of subclavian 7/01/24/2004;  LVEF of 60% documented via echocardiogram on 05/18/2017,  ____________________________ Caleen Essex TEST DATES EKG Date:  08/01/2017;  CABG: 11/22/2010;  Stent Placement Date: 05/19/2011;  Holter/Event Monitor Date: 08/19/2014;  Echocardiography Date: 05/18/2017;  Chest Xray Date: 05/18/2011;  CT Scan Date:  02/04/2005    ____________________________ FAMILY HISTORY Brother -- Brother dead Brother -- Brother dead, Unknown Disease Brother -- Brother dead, Cancer, Deceased Brother -- Brother dead, Bowel cancer, Deceased Brother -- Brother dead, Unknown Disease, Deceased Brother -- Brother dead, Deceased Brother -- Brother dead, Unknown Disease, Deceased Father -- Father dead, Unknown Disease, Deceased Mother -- Mother dead, Unknown Disease, Deceased Sister -- Sister alive with problem, Dementia/Alzheimer's Sister -- Sister dead, Heart Attack, Deceased ____________________________ SOCIAL HISTORY Alcohol Use:  no alcohol use;  Smoking:  used to smoke but quit 1996;  Diet:  regular diet;  Lifestyle:  widower, 2 sons and 2 daughters;  Exercise:  treadmill and bowling;  Occupation:  retired and [employer];  Residence:  lives with son;   ____________________________ REVIEW OF SYSTEMS General:  no change in weight Eyes: glaucoma, cataract extraction bilaterally Respiratory: denies dyspnea, cough, wheezing or hemoptysis. Cardiovascular:  please review HPI Abdominal: denies dyspepsia, GI bleeding, constipation, or diarrhea Genitourinary-Male: see HPI  Musculoskeletal:  arthritis of the knee Neurological:  dizziness  ____________________________ PHYSICAL EXAMINATION VITAL SIGNS  Blood Pressure:  100/66 Sitting, Left arm, regular cuff  , 90/60 Standing, Left arm and regular cuff   Pulse:  70/min. Weight:  132.00 lbs. Height:  68"BMI: 20  Constitutional:  pleasant elderly white male in no acute distress Skin:  scattered hemangiomas, scattered ecchymosis Head:  normocephalic, normal hair pattern, no masses or tenderness Neck:  supple, no masses, thyromegaly, JVD. Carotid pulses are full and equal bilaterally without bruits. Chest:  reduced breath sounds left base, healed median sternotomy scar Cardiac:  irregular rhythm, normal S1 and S2, no S3 or S4 Peripheral Pulses:  femoral pulses 2+,  posterior tibial pulses 3+, dorsalis pedis pulses 2+ Extremities & Back:  well healed saphenous vein donor site LLE, no edema present Neurological:  unsteady gait ____________________________ MOST RECENT LIPID PANEL 01/26/17  CHOL TOTL 187 mg/dl, LDL 109 NM, HDL 57 mg/dl, TRIGLYCER 101 mg/dl and CHOL/HDL 3.3 (Calc) ____________________________ IMPRESSIONS/PLAN  1. Persistent atrial flutter 2. CAD with previous stenting of the vein graft to the marginal 3. Prior history of GI bleeding  Recommendations:  Total EKG personally reviewed shows PVCs and atrial flutter with controlled response. He does have episodic dizziness that we have not been able to correlate this with arrhythmia through the years. I would like him to have a consultation with electrophysiologist to see what he thinks about ablation followed by short course of anticoagulation following that. He does have advanced age and the other question would be just leave things alone. Awaiting EP opinion. Followup in 3 months. ____________________________ TODAYS ORDERS  1. 12 Lead EKG: Today  2. Return Visit: 3 months                       ____________________________ Cardiology Physician:  Kerry Hough MD [facility]

## 2017-10-30 NOTE — Addendum Note (Signed)
Addended by: Mattie Marlin on: 10/30/2017 08:33 AM   Modules accepted: Orders

## 2017-11-16 ENCOUNTER — Emergency Department (HOSPITAL_BASED_OUTPATIENT_CLINIC_OR_DEPARTMENT_OTHER): Payer: Medicare Other

## 2017-11-16 ENCOUNTER — Other Ambulatory Visit: Payer: Self-pay

## 2017-11-16 ENCOUNTER — Encounter (HOSPITAL_BASED_OUTPATIENT_CLINIC_OR_DEPARTMENT_OTHER): Payer: Self-pay

## 2017-11-16 ENCOUNTER — Emergency Department (HOSPITAL_BASED_OUTPATIENT_CLINIC_OR_DEPARTMENT_OTHER)
Admission: EM | Admit: 2017-11-16 | Discharge: 2017-11-16 | Payer: Medicare Other | Attending: Emergency Medicine | Admitting: Emergency Medicine

## 2017-11-16 DIAGNOSIS — Z7982 Long term (current) use of aspirin: Secondary | ICD-10-CM | POA: Diagnosis not present

## 2017-11-16 DIAGNOSIS — R0602 Shortness of breath: Secondary | ICD-10-CM | POA: Diagnosis present

## 2017-11-16 DIAGNOSIS — Z7902 Long term (current) use of antithrombotics/antiplatelets: Secondary | ICD-10-CM | POA: Insufficient documentation

## 2017-11-16 DIAGNOSIS — R319 Hematuria, unspecified: Secondary | ICD-10-CM | POA: Insufficient documentation

## 2017-11-16 DIAGNOSIS — Z87891 Personal history of nicotine dependence: Secondary | ICD-10-CM | POA: Insufficient documentation

## 2017-11-16 DIAGNOSIS — Z9049 Acquired absence of other specified parts of digestive tract: Secondary | ICD-10-CM | POA: Diagnosis not present

## 2017-11-16 DIAGNOSIS — N390 Urinary tract infection, site not specified: Secondary | ICD-10-CM

## 2017-11-16 DIAGNOSIS — J449 Chronic obstructive pulmonary disease, unspecified: Secondary | ICD-10-CM | POA: Diagnosis not present

## 2017-11-16 DIAGNOSIS — I2510 Atherosclerotic heart disease of native coronary artery without angina pectoris: Secondary | ICD-10-CM | POA: Insufficient documentation

## 2017-11-16 DIAGNOSIS — Z951 Presence of aortocoronary bypass graft: Secondary | ICD-10-CM | POA: Insufficient documentation

## 2017-11-16 DIAGNOSIS — I10 Essential (primary) hypertension: Secondary | ICD-10-CM | POA: Insufficient documentation

## 2017-11-16 DIAGNOSIS — Z79899 Other long term (current) drug therapy: Secondary | ICD-10-CM | POA: Insufficient documentation

## 2017-11-16 HISTORY — DX: Urinary tract infection, site not specified: N39.0

## 2017-11-16 LAB — URINALYSIS, ROUTINE W REFLEX MICROSCOPIC
Bilirubin Urine: NEGATIVE
Glucose, UA: NEGATIVE mg/dL
Ketones, ur: NEGATIVE mg/dL
NITRITE: NEGATIVE
PH: 7 (ref 5.0–8.0)
Protein, ur: NEGATIVE mg/dL
SPECIFIC GRAVITY, URINE: 1.01 (ref 1.005–1.030)

## 2017-11-16 LAB — CBC WITH DIFFERENTIAL/PLATELET
ABS IMMATURE GRANULOCYTES: 0.02 10*3/uL (ref 0.00–0.07)
BASOS ABS: 0 10*3/uL (ref 0.0–0.1)
Basophils Relative: 1 %
Eosinophils Absolute: 0.2 10*3/uL (ref 0.0–0.5)
Eosinophils Relative: 2 %
HEMATOCRIT: 33.5 % — AB (ref 39.0–52.0)
HEMOGLOBIN: 10.2 g/dL — AB (ref 13.0–17.0)
IMMATURE GRANULOCYTES: 0 %
LYMPHS ABS: 1.5 10*3/uL (ref 0.7–4.0)
Lymphocytes Relative: 22 %
MCH: 30.6 pg (ref 26.0–34.0)
MCHC: 30.4 g/dL (ref 30.0–36.0)
MCV: 100.6 fL — ABNORMAL HIGH (ref 80.0–100.0)
Monocytes Absolute: 0.4 10*3/uL (ref 0.1–1.0)
Monocytes Relative: 5 %
NEUTROS ABS: 4.9 10*3/uL (ref 1.7–7.7)
NEUTROS PCT: 70 %
NRBC: 0 % (ref 0.0–0.2)
Platelets: 332 10*3/uL (ref 150–400)
RBC: 3.33 MIL/uL — ABNORMAL LOW (ref 4.22–5.81)
RDW: 16.4 % — ABNORMAL HIGH (ref 11.5–15.5)
WBC: 7 10*3/uL (ref 4.0–10.5)

## 2017-11-16 LAB — COMPREHENSIVE METABOLIC PANEL
ALT: 68 U/L — AB (ref 0–44)
ANION GAP: 11 (ref 5–15)
AST: 92 U/L — ABNORMAL HIGH (ref 15–41)
Albumin: 3.3 g/dL — ABNORMAL LOW (ref 3.5–5.0)
Alkaline Phosphatase: 74 U/L (ref 38–126)
BUN: 25 mg/dL — ABNORMAL HIGH (ref 8–23)
CHLORIDE: 98 mmol/L (ref 98–111)
CO2: 25 mmol/L (ref 22–32)
CREATININE: 1.02 mg/dL (ref 0.61–1.24)
Calcium: 8.9 mg/dL (ref 8.9–10.3)
GFR calc non Af Amer: >60 mL/min (ref >60)
Glucose, Bld: 202 mg/dL — ABNORMAL HIGH (ref 70–99)
Potassium: 3.6 mmol/L (ref 3.5–5.1)
Sodium: 134 mmol/L — ABNORMAL LOW (ref 135–145)
Total Bilirubin: 2 mg/dL — ABNORMAL HIGH (ref 0.3–1.2)
Total Protein: 7.4 g/dL (ref 6.5–8.1)

## 2017-11-16 LAB — URINALYSIS, MICROSCOPIC (REFLEX)

## 2017-11-16 LAB — I-STAT CG4 LACTIC ACID, ED
LACTIC ACID, VENOUS: 1.21 mmol/L (ref 0.5–1.9)
Lactic Acid, Venous: 3.4 mmol/L (ref 0.5–1.9)

## 2017-11-16 LAB — TROPONIN I

## 2017-11-16 MED ORDER — SODIUM CHLORIDE 0.9 % IV BOLUS
500.0000 mL | Freq: Once | INTRAVENOUS | Status: AC
  Administered 2017-11-16: 500 mL via INTRAVENOUS

## 2017-11-16 MED ORDER — IOPAMIDOL (ISOVUE-370) INJECTION 76%
100.0000 mL | Freq: Once | INTRAVENOUS | Status: AC | PRN
  Administered 2017-11-16: 72 mL via INTRAVENOUS

## 2017-11-16 MED ORDER — SODIUM CHLORIDE 0.9 % IV SOLN
1.0000 g | Freq: Once | INTRAVENOUS | Status: AC
  Administered 2017-11-16: 1 g via INTRAVENOUS
  Filled 2017-11-16: qty 10

## 2017-11-16 MED ORDER — SODIUM CHLORIDE 0.9 % IV BOLUS
500.0000 mL | Freq: Once | INTRAVENOUS | Status: AC
  Administered 2017-11-16: 1000 mL via INTRAVENOUS

## 2017-11-16 NOTE — ED Provider Notes (Signed)
[facility] Provider Note   CSN: [id number] Arrival date & time: 11/16/17  1114     History   Chief Complaint Chief Complaint  Patient presents with  . Shortness of Breath    HPI Ayden Hardwick is a 84 y.o. male.  Patient is a 84 year old gentleman with a history of coronary artery disease status post stent placement and CABG, intermittent atrial flutter, anemia after upper GI bleed resulting in gastrectomy who recently had kidney stone lithotripsy and stent placement 2 weeks ago presenting today with 2 days of worsening shortness of breath.  Family states for years he has had gradually worsening shortness of breath with exertion to where now he usually only walks short distances but patient states over the last 2 days it is seemed much worse.  He feels slightly short of breath just at rest but it is much worse when he tries to walk.  He denies any chest discomfort of any kind, abdominal pain, nausea or vomiting.  Family states since he had the stent placed he had poor p.o. intake but he denies any fever or chills.  He is still making urine.  Unclear when the stent will be removed because it depends if there is any further lithotripsy that will be necessary.  Patient was on an antibiotic after the procedure but that finished approximately 4 days ago.  He is still taking a pill that they state they were given for his stent 3 times a day.  The history is provided by the patient and a relative.  Shortness of Breath  This is a new problem. The average episode lasts 2 days. The problem occurs continuously.The current episode started 2 days ago. The problem has been gradually worsening. Pertinent negatives include no fever, no cough, no sputum production, no hemoptysis, no wheezing, no chest pain, no vomiting, no leg pain and no leg swelling. Associated symptoms comments: Decreased appetite per family. It is unknown what precipitated the problem. He has tried nothing  for the symptoms. The treatment provided no relief. Associated medical issues include CAD and recent surgery.    Past Medical History:  Diagnosis Date  . Anemia   . Angina   . Arthritis   . Atrial flutter ([place])   . Blood transfusion    Had 8 units for GI bleed  . CAD (coronary artery disease)    Dr. Wynonia Lawman is his Cardiologist  . Dizzy    "when I take my blood pressure medicine sometimes it makes me a little dizzy"  . GERD (gastroesophageal reflux disease)   . Heart murmur   . History of primary tuberculosis   . History of upper GI bleeding    8 units for GI bleed   . Hyperlipidemia   . Hypertension   . Kidney stones   . Malignant neoplasm of upper lobe, unspecified bronchus or lung ([place])   . Pneumonia    h/o walking pneumonia at least 20 years ago  . Presence of aortocoronary bypass graft   . Presence of coronary angioplasty implant and graft   . Shortness of breath 05/18/11   "sometimes lying down; sometimes w/activity"  . Sinus drainage    11/24/10 "every morning when I get up I have to clear it out"  . Sleep disorder    "trouble staying asleep"  . Tuberculosis 1969   Was in sanitorium for 1 year  . Ventricular premature depolarization     Patient Active Problem List   Diagnosis Date Noted  .  Atrial flutter ([place])   . Presence of aortocoronary bypass graft   . Presence of coronary angioplasty implant and graft   . Ventricular premature depolarization   . History of atrial fibrillation 12/08/2016  . COPD GOLD I  07/05/2015  . Calculus of kidney 06/08/2015  . Solitary pulmonary nodule 05/20/2015  . History of tuberculosis 11/17/2010  . CAD (coronary artery disease) 11/16/2010  . Hyperlipidemia 11/16/2010  . History of upper GI bleeding 11/16/2010  . History of CABG 11/16/2010    Past Surgical History:  Procedure Laterality Date  . A-FLUTTER ABLATION N/A 09/12/2017   Procedure: A-FLUTTER ABLATION;  Surgeon: Evans Lance, MD;  Location: [place] [facility];   Service: Cardiovascular;  Laterality: N/A;  . APPENDECTOMY    . bleeding ulcer     w/gallbladder  . CATARACT EXTRACTION W/ INTRAOCULAR LENS IMPLANT     bilaterally  . CHOLECYSTECTOMY     "w/bleeding ulcer OR"  . CORONARY ANGIOPLASTY WITH STENT PLACEMENT  02/2011  . CORONARY ARTERY BYPASS GRAFT  03/25/1999   CABG x5 using LIMA to LAD, SVG to Diag, SVG to OM, sequential SVG to PD and RPL, open SV harvest from left thigh and leg  . CORONARY ARTERY BYPASS GRAFT  11/22/2010   Procedure: REDO CORONARY ARTERY BYPASS GRAFTING (CABG)TIMES TWO;  Surgeon: Rexene Alberts, MD;  Location: [place];  Service: Open Heart Surgery;  Laterality: N/A;  using endoscopically harvested right saphenous vein and left radial artery; Transesophageal echocardiogram  . INGUINAL HERNIA REPAIR     ? left  . LEFT HEART CATHETERIZATION WITH CORONARY/GRAFT ANGIOGRAM N/A 03/01/2011   Procedure: LEFT HEART CATHETERIZATION WITH Beatrix Fetters;  Surgeon: Jettie Booze, MD;  Location: [facility] CATH LAB;  Service: Cardiovascular;  Laterality: N/A;  . LEFT HEART CATHETERIZATION WITH CORONARY/GRAFT ANGIOGRAM N/A 05/19/2011   Procedure: LEFT HEART CATHETERIZATION WITH Beatrix Fetters;  Surgeon: Jettie Booze, MD;  Location: [facility] CATH LAB;  Service: Cardiovascular;  Laterality: N/A;  . LITHOTRIPSY    . OTHER SURGICAL HISTORY  03/25/1999   reexploration for bleeding s/p CABG - coagulopathy without mechanical source  . PARTIAL GASTRECTOMY    . PERCUTANEOUS CORONARY STENT INTERVENTION (PCI-S) N/A 03/02/2011   Procedure: PERCUTANEOUS CORONARY STENT INTERVENTION (PCI-S);  Surgeon: Jettie Booze, MD;  Location: [facility] CATH LAB;  Service: Cardiovascular;  Laterality: N/A;  . PERCUTANEOUS CORONARY STENT INTERVENTION (PCI-S)  05/19/2011   Procedure: PERCUTANEOUS CORONARY STENT INTERVENTION (PCI-S);  Surgeon: Jettie Booze, MD;  Location: [facility] CATH LAB;  Service: Cardiovascular;;  . RADIAL ARTERY HARVEST  11/22/2010    Procedure: RADIAL ARTERY HARVEST;  Surgeon: Rexene Alberts, MD;  Location: [place];  Service: Open Heart Surgery;  Laterality: Left;  . Redo CABG  11/22/2010   CABG x2 with left radial to OM and SVG to RCA  . Right testicle surgery          Home Medications    Prior to Admission medications   Medication Sig Start Date End Date Taking? Authorizing Provider  aspirin EC 81 MG tablet Take 81 mg by mouth at bedtime.    [provider]  atorvastatin (LIPITOR) 80 MG tablet Take 80 mg by mouth at bedtime.     [provider]  clopidogrel (PLAVIX) 75 MG tablet Take 75 mg by mouth at bedtime.    [provider]  ezetimibe (ZETIA) 10 MG tablet Take 10 mg by mouth at bedtime. 08/22/17   [provider]  folic acid (FOLVITE)  1 MG tablet Take 1 mg by mouth at bedtime.     [provider]  nitroGLYCERIN (NITROSTAT) 0.4 MG SL tablet Place 0.4 mg under the tongue every 5 (five) minutes as needed. For chest pain    [provider]    Family History Family History  Problem Relation Age of Onset  . Cancer Father   . Unexplained death Mother   . Unexplained death Brother   . Bowel Disease Brother   . Alzheimer's disease Sister   . Cancer Brother   . Stroke Brother   . Heart attack Sister     Social History Social History   Tobacco Use  . Smoking status: Former Smoker    Packs/day: 0.25    Years: 25.00    Pack years: 6.25    Types: Cigarettes    Last attempt to quit: 01/17/1975    Years since quitting: 42.8  . Smokeless tobacco: Never Used  Substance Use Topics  . Alcohol use: No    Alcohol/week: 0.0 standard drinks  . Drug use: No     Allergies   Patient has no known allergies.   Review of Systems Review of Systems  Constitutional: Negative for fever.  Respiratory: Positive for shortness of breath. Negative for cough, hemoptysis, sputum production and wheezing.   Cardiovascular: Negative for chest pain and leg swelling.    Gastrointestinal: Negative for vomiting.  All other systems reviewed and are negative.    Physical Exam Updated Vital Signs BP (!) 110/55   Pulse 69   Temp 98.8 F (37.1 C) (Rectal)   Resp (!) 23   SpO2 99%   Physical Exam  Constitutional: He is oriented to person, place, and time. He appears well-developed and well-nourished. No distress.  HENT:  Head: Normocephalic and atraumatic.  Mouth/Throat: Oropharynx is clear and moist.  edentulous  Eyes: Pupils are equal, round, and reactive to light. Conjunctivae and EOM are normal.  Neck: Normal range of motion. Neck supple.  Cardiovascular: Regular rhythm and intact distal pulses. Tachycardia present.  No murmur heard. Pulmonary/Chest: Effort normal and breath sounds normal. Tachypnea noted. No respiratory distress. He has no wheezes. He has no rales.  Abdominal: Soft. He exhibits no distension. There is tenderness. There is no rebound and no guarding.  Mild LLQ tenderness.  No flank tenderness  Musculoskeletal: Normal range of motion. He exhibits no edema or tenderness.       Right lower leg: He exhibits no edema.       Left lower leg: He exhibits no edema.  Neurological: He is alert and oriented to person, place, and time.  Skin: Skin is warm and dry. No rash noted. No erythema. There is pallor.  Psychiatric: He has a normal mood and affect. His behavior is normal.  Nursing note and vitals reviewed.    ED Treatments / Results  Labs (all labs ordered are listed, but only abnormal results are displayed) Labs Reviewed  COMPREHENSIVE METABOLIC PANEL - Abnormal; Notable for the following components:      Result Value   Sodium 134 (*)    Glucose, Bld 202 (*)    BUN 25 (*)    Albumin 3.3 (*)    AST 92 (*)    ALT 68 (*)    Total Bilirubin 2.0 (*)    All other components within normal limits  CBC WITH DIFFERENTIAL/PLATELET - Abnormal; Notable for the following components:   RBC 3.33 (*)    Hemoglobin 10.2 (*)  HCT 33.5  (*)    MCV 100.6 (*)    RDW 16.4 (*)    All other components within normal limits  I-STAT CG4 LACTIC ACID, ED - Abnormal; Notable for the following components:   Lactic Acid, Venous 3.40 (*)    All other components within normal limits  CULTURE, BLOOD (ROUTINE X 2)  CULTURE, BLOOD (ROUTINE X 2)  URINE CULTURE  TROPONIN I  URINALYSIS, ROUTINE W REFLEX MICROSCOPIC  I-STAT CG4 LACTIC ACID, ED    EKG EKG Interpretation  Date/Time:  Friday November 16 2017 11:23:12 EDT Ventricular Rate:  99 PR Interval:    QRS Duration: 128 QT Interval:  379 QTC Calculation: 482 R Axis:   73 Text Interpretation:  Sinus rhythm Ventricular premature complex  Right bundle branch block from  Probable posterior infarct, acute  Baseline wander in lead(s) V1 No significant change since last tracing 10/26/17 Reconfirmed by Blanchie Dessert [phone]) on 11/16/2017 11:40:01 AM   Radiology Dg Chest Port 1 View  Result Date: 11/16/2017 CLINICAL DATA:  Shortness of breath for a day.  Former smoker. EXAM: PORTABLE CHEST 1 VIEW COMPARISON:  Chest x-rays dated 10/19/2017, 12/08/2016 and 07/07/2016. FINDINGS: Heart size and mediastinal contours are stable. Median sternotomy wires are intact and stable in alignment. Coarse interstitial lung markings are again seen bilaterally indicating chronic interstitial lung disease no new confluent opacity to suggest a developing pneumonia. No pleural effusion or pneumothorax seen. The retrocardiac nodule described on most recent chest x-ray of 10/19/2017 is not as well seen on today's exam, likely obscured by the overlying diaphragm, corresponding to the suspicious spiculated LEFT lower lobe pulmonary nodule described on chest CT and PET-CT in 2017. IMPRESSION: 1. No active disease.  No evidence of pneumonia or pulmonary edema. 2. Chronic interstitial lung disease. Electronically Signed   By: Franki Cabot M.D.   On: 11/16/2017 12:02   Dg Abd Portable 1 View  Result Date:  11/16/2017 CLINICAL DATA:  Shortness of breath one day. EXAM: PORTABLE ABDOMEN - 1 VIEW COMPARISON:  Plain film of the abdomen dated 07/12/2017. FINDINGS: Fairly large amount of stool in the RIGHT colon. Overall bowel gas pattern appears nonobstructive. LEFT-sided nephroureteral stent in place. 2 cm LEFT renal stone, not appreciably changed compared to earlier CT abdomen of 06/13/2017. Cholecystectomy clips in the RIGHT upper quadrant. IMPRESSION: 1. Fairly large amount of stool in the RIGHT colon (constipation?). 2. Overall bowel gas pattern is nonobstructive. 3. LEFT nephrolithiasis. LEFT nephroureteral stent appears appropriately positioned. Electronically Signed   By: Franki Cabot M.D.   On: 11/16/2017 12:04    Procedures Procedures (including critical care time)  Medications Ordered in ED Medications  sodium chloride 0.9 % bolus 500 mL (0 mLs Intravenous Stopped 11/16/17 1234)  sodium chloride 0.9 % bolus 500 mL (0 mLs Intravenous Stopped 11/16/17 1423)  sodium chloride 0.9 % bolus 500 mL (500 mLs Intravenous New Bag/Given 11/16/17 1432)  iopamidol (ISOVUE-370) 76 % injection 100 mL (100 mLs Intravenous Contrast Given 11/16/17 1515)     Initial Impression / Assessment and Plan / ED Course  I have reviewed the triage vital signs and the nursing notes.  Pertinent labs & imaging results that were available during my care of the patient were reviewed by me and considered in my medical decision making (see chart for details).     Elderly male presenting today with complaints of shortness of breath but concern for possible sepsis.  Patient is awake alert and oriented.  He has had  2 days of worsening shortness of breath but denies cough or chest pain.  Patient feels warm today and concern for possible fever in the setting of stent placement and lithotripsy approximately 2 weeks ago.  Patient has been off antibiotics for 3 to 4 days now.  He has ongoing chronic shortness of breath but states it is  worse now.  Also concern for possible dehydration his family states he is not eating or drinking.  Patient has a long history of cardiac disease including atrial fibrillation and coronary artery disease status post CABG and stents.  Today patient's EKG shows a right bundle branch block and ST depression in V1 through V3 with concern for possible posterior infarct.  However last EKG was 10/26/17 and is unchanged.  Patient's breath sounds are clear bilaterally but he is tachypneic.  He has no evidence of fluid overload today.  He has mild tenderness in his left lower quadrant but no flank pain or frank abdominal discomfort.  Concern for possible sepsis in the setting of may be a UTI with recent stent placement.  However also concern for atypical ACS vs anemia.  Lower suspicion for clot.  Code sepsis order sets initiated.  Patient given gentle bolus until further information obtained.  2:58 PM Patient does have some improvement of symptoms after IV bolus.  Lactic acid of 3.4.  Normal creatinine but LFTs elevated at 92 and 68 today.  White count is within normal limits, troponin is within normal limits, EKG is unchanged.  X-ray without acute findings and abdomen film shows satisfactory placement of stent.  Still waiting on urine.  Patient's blood pressure dropping into the 56Y and 61U systolic.  We will do CT to rule out PE as he is recently had procedures and been hospitalized and is increased risk for PE as well as prior hx of bronchogenic carcinoma in the LLL that he did not undergo treatment in 2017.  3:24 PM Pt checked out to Dr. Lita Mains at 1500 Final Clinical Impressions(s) / ED Diagnoses   Final diagnoses:  None    ED Discharge Orders    None       Blanchie Dessert, MD 11/16/17 1525

## 2017-11-16 NOTE — ED Provider Notes (Signed)
Received signout from Dr. Maryan Rued.  CT angio chest with left base lung mass.  Also has subcapsular hematoma to the left kidney.  Patient also with evidence of urinary tract infection.  Blood pressure remains borderline low with systolic in the 94R.  Patient is alert and mentating well.  Initiated antibiotics.  Discussed with Dr. Wonda Amis who will accept patient in transfer to [facility] hospital.  Discussed results with patient and family.  Agrees with plan.   Julianne Rice, MD 11/16/17 1739

## 2017-11-16 NOTE — ED Triage Notes (Signed)
Pt c/o increase SOB, had a kidney stone blasted last week and has a decreased appetite since; states decreased in drinking fluid also

## 2017-11-17 DIAGNOSIS — Z85118 Personal history of other malignant neoplasm of bronchus and lung: Secondary | ICD-10-CM

## 2017-11-17 DIAGNOSIS — Z960 Presence of urogenital implants: Secondary | ICD-10-CM

## 2017-11-17 DIAGNOSIS — S37012A Minor contusion of left kidney, initial encounter: Secondary | ICD-10-CM | POA: Insufficient documentation

## 2017-11-17 HISTORY — DX: Presence of urogenital implants: Z96.0

## 2017-11-17 HISTORY — DX: Personal history of other malignant neoplasm of bronchus and lung: Z85.118

## 2017-11-17 HISTORY — DX: Minor contusion of left kidney, initial encounter: S37.012A

## 2017-11-19 LAB — URINE CULTURE: Culture: >=100000 — AB

## 2017-11-20 ENCOUNTER — Telehealth: Payer: Self-pay | Admitting: Emergency Medicine

## 2017-11-20 NOTE — Telephone Encounter (Signed)
Post ED Visit - Positive Culture Follow-up  Culture report reviewed by antimicrobial stewardship pharmacist:  []  Elenor Quinones, Pharm.D. []  Heide Guile, Pharm.D., BCPS AQ-ID []  Parks Neptune, Pharm.D., BCPS []  Alycia Rossetti, Pharm.D., BCPS []  Lipan, Pharm.D., BCPS, AAHIVP []  Legrand Como, Pharm.D., BCPS, AAHIVP []  Salome Arnt, PharmD, BCPS []  Johnnette Gourd, PharmD, BCPS []  Hughes Better, PharmD, BCPS []  Leeroy Cha, PharmD J. Tessin PharmD  Positive urine culture Treated with none, asymptomatic,  no further patient follow-up is required at this time.  Hazle Nordmann 11/20/2017, 2:41 PM

## 2017-11-21 LAB — CULTURE, BLOOD (ROUTINE X 2)
CULTURE: NO GROWTH
Culture: NO GROWTH
SPECIAL REQUESTS: ADEQUATE
Special Requests: ADEQUATE

## 2017-11-23 ENCOUNTER — Encounter: Payer: Self-pay | Admitting: Cardiology

## 2017-11-23 ENCOUNTER — Ambulatory Visit (INDEPENDENT_AMBULATORY_CARE_PROVIDER_SITE_OTHER): Payer: Medicare Other | Admitting: Cardiology

## 2017-11-23 VITALS — BP 124/60 | HR 56 | Ht 65.0 in | Wt 128.4 lb

## 2017-11-23 DIAGNOSIS — I483 Typical atrial flutter: Secondary | ICD-10-CM | POA: Diagnosis not present

## 2017-11-23 DIAGNOSIS — I2510 Atherosclerotic heart disease of native coronary artery without angina pectoris: Secondary | ICD-10-CM | POA: Diagnosis not present

## 2017-11-23 DIAGNOSIS — Z951 Presence of aortocoronary bypass graft: Secondary | ICD-10-CM

## 2017-11-23 DIAGNOSIS — N200 Calculus of kidney: Secondary | ICD-10-CM | POA: Diagnosis not present

## 2017-11-23 DIAGNOSIS — I4891 Unspecified atrial fibrillation: Secondary | ICD-10-CM

## 2017-11-23 NOTE — Progress Notes (Signed)
Cardiology Office Note:    Date:  11/23/2017   ID:  Jacob Hampton, DOB [birth year], MRN [id number]  PCP:  Patient, No Pcp Per  Cardiologist:  Jenne Campus, MD    Referring MD: No ref. provider found   Chief Complaint  Patient presents with  . Hospitalization Follow-up  I am doing fair  History of Present Illness:    Jacob Hampton is a 84 y.o. male whom I seen a month ago for evaluation before lithotripsy.  He does have history of paroxysmal atrial flutter which was typical isthmus related and in the summer it was ablated by Dr. Dorothea Ogle.  He is doing well overall denies having any chest pain tightness squeezing pressure burning chest he went to lithotripsy with no major difficulties however few days later he came to the emergency room being dizzy hypotensive and also short of breath.  He was resuscitated with fluid did well and was discharged home now doing fair complain of being weak tired exhausted some fatigue and tiredness and dizziness when he gets up quickly he looks very pale to me today.  He did have anemia that did not require blood transfusion.  He does have some subcapsular hematoma on the lithotripsy side.  Denies having any hematuria.  Past Medical History:  Diagnosis Date  . Anemia   . Angina   . Arthritis   . Atrial flutter ([place])   . Blood transfusion    Had 8 units for GI bleed  . CAD (coronary artery disease)    Dr. Wynonia Lawman is his Cardiologist  . Dizzy    "when I take my blood pressure medicine sometimes it makes me a little dizzy"  . GERD (gastroesophageal reflux disease)   . Heart murmur   . History of primary tuberculosis   . History of upper GI bleeding    8 units for GI bleed   . Hyperlipidemia   . Hypertension   . Kidney stones   . Malignant neoplasm of upper lobe, unspecified bronchus or lung ([place])   . Pneumonia    h/o walking pneumonia at least 20 years ago  . Presence of aortocoronary bypass graft   . Presence of coronary angioplasty implant and  graft   . Shortness of breath 05/18/11   "sometimes lying down; sometimes w/activity"  . Sinus drainage    11/24/10 "every morning when I get up I have to clear it out"  . Sleep disorder    "trouble staying asleep"  . Tuberculosis 1969   Was in sanitorium for 1 year  . Ventricular premature depolarization     Past Surgical History:  Procedure Laterality Date  . A-FLUTTER ABLATION N/A 09/12/2017   Procedure: A-FLUTTER ABLATION;  Surgeon: Evans Lance, MD;  Location: [facility];  Service: Cardiovascular;  Laterality: N/A;  . APPENDECTOMY    . bleeding ulcer     w/gallbladder  . CATARACT EXTRACTION W/ INTRAOCULAR LENS IMPLANT     bilaterally  . CHOLECYSTECTOMY     "w/bleeding ulcer OR"  . CORONARY ANGIOPLASTY WITH STENT PLACEMENT  02/2011  . CORONARY ARTERY BYPASS GRAFT  03/25/1999   CABG x5 using LIMA to LAD, SVG to Diag, SVG to OM, sequential SVG to PD and RPL, open SV harvest from left thigh and leg  . CORONARY ARTERY BYPASS GRAFT  11/22/2010   Procedure: REDO CORONARY ARTERY BYPASS GRAFTING (CABG)TIMES TWO;  Surgeon: Rexene Alberts, MD;  Location: [facility];  Service: Open Heart Surgery;  Laterality: N/A;  using endoscopically harvested right saphenous vein and left radial artery; Transesophageal echocardiogram  . INGUINAL HERNIA REPAIR     ? left  . LEFT HEART CATHETERIZATION WITH CORONARY/GRAFT ANGIOGRAM N/A 03/01/2011   Procedure: LEFT HEART CATHETERIZATION WITH Beatrix Fetters;  Surgeon: Jettie Booze, MD;  Location: [facility] C[facility] LAB;  Service: Cardiovascular;  Laterality: N/A;  . LEFT HEART CATHETERIZATION WITH CORONARY/GRAFT ANGIOGRAM N/A 05/19/2011   Procedure: LEFT HEART CATHETERIZATION WITH Beatrix Fetters;  Surgeon: Jettie Booze, MD;  Location: [facility] CATH LAB;  Service: Cardiovascular;  Laterality: N/A;  . LITHOTRIPSY    . OTHER SURGICAL HISTORY  03/25/1999   reexploration for bleeding s/p CABG - coagulopathy without mechanical source  . PARTIAL  GASTRECTOMY    . PERCUTANEOUS CORONARY STENT INTERVENTION (PCI-S) N/A 03/02/2011   Procedure: PERCUTANEOUS CORONARY STENT INTERVENTION (PCI-S);  Surgeon: Jettie Booze, MD;  Location: [facility] CATH LAB;  Service: Cardiovascular;  Laterality: N/A;  . PERCUTANEOUS CORONARY STENT INTERVENTION (PCI-S)  05/19/2011   Procedure: PERCUTANEOUS CORONARY STENT INTERVENTION (PCI-S);  Surgeon: Jettie Booze, MD;  Location: [facility] CATH LAB;  Service: Cardiovascular;;  . RADIAL ARTERY HARVEST  11/22/2010   Procedure: RADIAL ARTERY HARVEST;  Surgeon: Rexene Alberts, MD;  Location: [place];  Service: Open Heart Surgery;  Laterality: Left;  . Redo CABG  11/22/2010   CABG x2 with left radial to OM and SVG to RCA  . Right testicle surgery      Current Medications: Current Meds  Medication Sig  . aspirin EC 81 MG tablet Take 81 mg by mouth at bedtime.  Marland Kitchen atorvastatin (LIPITOR) 80 MG tablet Take 80 mg by mouth at bedtime.   . clopidogrel (PLAVIX) 75 MG tablet Take 75 mg by mouth at bedtime.  Marland Kitchen ezetimibe (ZETIA) 10 MG tablet Take 10 mg by mouth at bedtime.  . folic acid (FOLVITE) 1 MG tablet Take 1 mg by mouth at bedtime.   . nitroGLYCERIN (NITROSTAT) 0.4 MG SL tablet Place 0.4 mg under the tongue every 5 (five) minutes as needed. For chest pain  . sulfamethoxazole-trimethoprim (BACTRIM DS,SEPTRA DS) 800-160 MG tablet Take 1 tablet by mouth 2 (two) times daily. for 7 days     Allergies:   Patient has no known allergies.   Social History   Socioeconomic History  . Marital status: Widowed    Spouse name: Not on file  . Number of children: Not on file  . Years of education: Not on file  . Highest education level: Not on file  Occupational History  . Not on file  Social Needs  . Financial resource strain: Not on file  . Food insecurity:    Worry: Not on file    Inability: Not on file  . Transportation needs:    Medical: Not on file    Non-medical: Not on file  Tobacco Use  . Smoking status: Former  Smoker    Packs/day: 0.25    Years: 25.00    Pack years: 6.25    Types: Cigarettes    Last attempt to quit: 01/17/1975    Years since quitting: 42.8  . Smokeless tobacco: Never Used  Substance and Sexual Activity  . Alcohol use: No    Alcohol/week: 0.0 standard drinks  . Drug use: No  . Sexual activity: Not on file  Lifestyle  . Physical activity:    Days per week: Not on file    Minutes per session: Not on file  . Stress: Not on file  Relationships  .  Social connections:    Talks on phone: Not on file    Gets together: Not on file    Attends religious service: Not on file    Active member of club or organization: Not on file    Attends meetings of clubs or organizations: Not on file    Relationship status: Not on file  Other Topics Concern  . Not on file  Social History Narrative   Retired [employer], 2 sons, 2 daughters   Bowls regularly.     Family History: The patient's family history includes Alzheimer's disease in his sister; Bowel Disease in his brother; Cancer in his brother and father; Heart attack in his sister; Stroke in his brother; Unexplained death in his brother and mother. ROS:   Please see the history of present illness.    All 14 point review of systems negative except as described per history of present illness  EKGs/Labs/Other Studies Reviewed:      Recent Labs: 11/16/2017: ALT 68; BUN 25; Creatinine, Ser 1.02; Hemoglobin 10.2; Platelets 332; Potassium 3.6; Sodium 134  Recent Lipid Panel    Component Value Date/Time   CHOL 117 03/01/2011 0531   TRIG 68 03/01/2011 0531   HDL 42 03/01/2011 0531   CHOLHDL 2.8 03/01/2011 0531   VLDL 14 03/01/2011 0531   LDLCALC 61 03/01/2011 0531    Physical Exam:    VS:  BP 124/60   Pulse (!) 56   Ht 5\' 5"  (1.651 m)   Wt 128 lb 6.4 oz (58.2 kg)   SpO2 94%   BMI 21.37 kg/m     Wt Readings from Last 3 Encounters:  11/23/17 128 lb 6.4 oz (58.2 kg)  10/26/17 133 lb (60.3 kg)    10/19/17 129 lb 12.8 oz (58.9 kg)     GEN:  Well nourished, well developed in no acute distress HEENT: Normal NECK: No JVD; No carotid bruits LYMPHATICS: No lymphadenopathy CARDIAC: RRR, no murmurs, no rubs, no gallops RESPIRATORY:  Clear to auscultation without rales, wheezing or rhonchi  ABDOMEN: Soft, non-tender, non-distended MUSCULOSKELETAL:  No edema; No deformity  SKIN: Warm and dry LOWER EXTREMITIES: no swelling NEUROLOGIC:  Alert and oriented x 3 PSYCHIATRIC:  Normal affect   ASSESSMENT:    1. Presence of aortocoronary bypass graft   2. Typical atrial flutter ([place])   3. Coronary artery disease involving native coronary artery of native heart without angina pectoris   4. Calculus of kidney    PLAN:    In order of problems listed above:  1. Coronary artery disease stable after coronary artery bypass graft years ago continue present conservative approach 2. History of typical atrial flutter status post ablation done in the summer denies having a palpitations but he does have some irregularity of his rhythm today therefore I will do EKG 3. Calculus of the kidney recent lithotripsy performed. 4. Overall he looks very pale therefore I will check his CBC today.   Medication Adjustments/Labs and Tests Ordered: Current medicines are reviewed at length with the patient today.  Concerns regarding medicines are outlined above.  No orders of the defined types were placed in this encounter.  Medication changes: No orders of the defined types were placed in this encounter.   Signed, Park Liter, MD, [facility] 11/23/2017 2:30 PM    [place]

## 2017-11-23 NOTE — Patient Instructions (Signed)
Medication Instructions:  Your physician recommends that you continue on your current medications as directed. Please refer to the Current Medication list given to you today.  If you need a refill on your cardiac medications before your next appointment, please call your pharmacy.   Lab work: Your physician recommends that you return for lab work today: CBC   If you have labs (blood work) drawn today and your tests are completely normal, you will receive your results only by: Marland Kitchen MyChart Message (if you have MyChart) OR . A paper copy in the mail If you have any lab test that is abnormal or we need to change your treatment, we will call you to review the results.  Testing/Procedures: None.   Follow-Up: At [facility], you and your health needs are our priority.  As part of our continuing mission to provide you with exceptional heart care, we have created designated Provider Care Teams.  These Care Teams include your primary Cardiologist (physician) and Advanced Practice Providers (APPs -  Physician Assistants and Nurse Practitioners) who all work together to provide you with the care you need, when you need it. You will need a follow up appointment in 3 months.  Please call our office 2 months in advance to schedule this appointment.  You may see No primary care provider on file. or another member of our [employer] Provider Team in [place]: Shirlee More, MD . Jyl Heinz, MD  Any Other Special Instructions Will Be Listed Below (If Applicable).

## 2017-11-24 LAB — CBC
HEMATOCRIT: 33.3 % — AB (ref 37.5–51.0)
Hemoglobin: 10.7 g/dL — ABNORMAL LOW (ref 13.0–17.7)
MCH: 30.7 pg (ref 26.6–33.0)
MCHC: 32.1 g/dL (ref 31.5–35.7)
MCV: 95 fL (ref 79–97)
Platelets: 344 10*3/uL (ref 150–450)
RBC: 3.49 x10E6/uL — ABNORMAL LOW (ref 4.14–5.80)
RDW: 15.4 % (ref 12.3–15.4)
WBC: 6.8 10*3/uL (ref 3.4–10.8)

## 2017-12-29 ENCOUNTER — Encounter (HOSPITAL_COMMUNITY)
Admission: EM | Disposition: A | Discharge status: Home / Self Care | Payer: Self-pay | Source: Home / Self Care | Attending: Internal Medicine

## 2017-12-29 ENCOUNTER — Other Ambulatory Visit: Payer: Self-pay

## 2017-12-29 ENCOUNTER — Emergency Department (HOSPITAL_BASED_OUTPATIENT_CLINIC_OR_DEPARTMENT_OTHER): Payer: Medicare Other

## 2017-12-29 ENCOUNTER — Observation Stay (HOSPITAL_COMMUNITY): Payer: Medicare Other | Admitting: Anesthesiology

## 2017-12-29 ENCOUNTER — Inpatient Hospital Stay (HOSPITAL_BASED_OUTPATIENT_CLINIC_OR_DEPARTMENT_OTHER)
Admission: EM | Admit: 2017-12-29 | Discharge: 2017-12-31 | DRG: 660 | Disposition: A | Discharge status: Home / Self Care | Payer: Medicare Other | Attending: Internal Medicine | Admitting: Internal Medicine

## 2017-12-29 ENCOUNTER — Observation Stay (HOSPITAL_COMMUNITY): Payer: Medicare Other

## 2017-12-29 ENCOUNTER — Encounter (HOSPITAL_BASED_OUTPATIENT_CLINIC_OR_DEPARTMENT_OTHER): Payer: Self-pay | Admitting: Emergency Medicine

## 2017-12-29 DIAGNOSIS — I712 Thoracic aortic aneurysm, without rupture: Secondary | ICD-10-CM | POA: Diagnosis present

## 2017-12-29 DIAGNOSIS — N179 Acute kidney failure, unspecified: Secondary | ICD-10-CM | POA: Diagnosis present

## 2017-12-29 DIAGNOSIS — I2510 Atherosclerotic heart disease of native coronary artery without angina pectoris: Secondary | ICD-10-CM | POA: Diagnosis not present

## 2017-12-29 DIAGNOSIS — N136 Pyonephrosis: Secondary | ICD-10-CM | POA: Diagnosis present

## 2017-12-29 DIAGNOSIS — N201 Calculus of ureter: Secondary | ICD-10-CM

## 2017-12-29 DIAGNOSIS — N135 Crossing vessel and stricture of ureter without hydronephrosis: Secondary | ICD-10-CM | POA: Diagnosis present

## 2017-12-29 DIAGNOSIS — N182 Chronic kidney disease, stage 2 (mild): Secondary | ICD-10-CM | POA: Diagnosis present

## 2017-12-29 DIAGNOSIS — E871 Hypo-osmolality and hyponatremia: Secondary | ICD-10-CM | POA: Diagnosis present

## 2017-12-29 DIAGNOSIS — Z951 Presence of aortocoronary bypass graft: Secondary | ICD-10-CM

## 2017-12-29 DIAGNOSIS — I9589 Other hypotension: Secondary | ICD-10-CM | POA: Diagnosis not present

## 2017-12-29 DIAGNOSIS — Z79899 Other long term (current) drug therapy: Secondary | ICD-10-CM

## 2017-12-29 DIAGNOSIS — L8990 Pressure ulcer of unspecified site, unspecified stage: Secondary | ICD-10-CM | POA: Diagnosis present

## 2017-12-29 DIAGNOSIS — I4892 Unspecified atrial flutter: Secondary | ICD-10-CM | POA: Diagnosis present

## 2017-12-29 DIAGNOSIS — E875 Hyperkalemia: Secondary | ICD-10-CM | POA: Diagnosis present

## 2017-12-29 DIAGNOSIS — N390 Urinary tract infection, site not specified: Secondary | ICD-10-CM | POA: Diagnosis present

## 2017-12-29 DIAGNOSIS — K5900 Constipation, unspecified: Secondary | ICD-10-CM

## 2017-12-29 DIAGNOSIS — Z87442 Personal history of urinary calculi: Secondary | ICD-10-CM | POA: Diagnosis not present

## 2017-12-29 DIAGNOSIS — Z87891 Personal history of nicotine dependence: Secondary | ICD-10-CM

## 2017-12-29 DIAGNOSIS — D696 Thrombocytopenia, unspecified: Secondary | ICD-10-CM | POA: Diagnosis present

## 2017-12-29 DIAGNOSIS — Z6821 Body mass index (BMI) 21.0-21.9, adult: Secondary | ICD-10-CM

## 2017-12-29 DIAGNOSIS — I129 Hypertensive chronic kidney disease with stage 1 through stage 4 chronic kidney disease, or unspecified chronic kidney disease: Secondary | ICD-10-CM | POA: Diagnosis present

## 2017-12-29 DIAGNOSIS — I483 Typical atrial flutter: Secondary | ICD-10-CM | POA: Diagnosis not present

## 2017-12-29 DIAGNOSIS — Z8719 Personal history of other diseases of the digestive system: Secondary | ICD-10-CM

## 2017-12-29 DIAGNOSIS — D539 Nutritional anemia, unspecified: Secondary | ICD-10-CM | POA: Diagnosis present

## 2017-12-29 DIAGNOSIS — Z955 Presence of coronary angioplasty implant and graft: Secondary | ICD-10-CM | POA: Diagnosis not present

## 2017-12-29 DIAGNOSIS — E785 Hyperlipidemia, unspecified: Secondary | ICD-10-CM | POA: Diagnosis not present

## 2017-12-29 DIAGNOSIS — R9431 Abnormal electrocardiogram [ECG] [EKG]: Secondary | ICD-10-CM

## 2017-12-29 DIAGNOSIS — J449 Chronic obstructive pulmonary disease, unspecified: Secondary | ICD-10-CM | POA: Diagnosis present

## 2017-12-29 DIAGNOSIS — E860 Dehydration: Secondary | ICD-10-CM

## 2017-12-29 DIAGNOSIS — E440 Moderate protein-calorie malnutrition: Secondary | ICD-10-CM | POA: Diagnosis not present

## 2017-12-29 DIAGNOSIS — K219 Gastro-esophageal reflux disease without esophagitis: Secondary | ICD-10-CM | POA: Diagnosis present

## 2017-12-29 DIAGNOSIS — Z7982 Long term (current) use of aspirin: Secondary | ICD-10-CM

## 2017-12-29 DIAGNOSIS — N132 Hydronephrosis with renal and ureteral calculous obstruction: Secondary | ICD-10-CM

## 2017-12-29 DIAGNOSIS — R319 Hematuria, unspecified: Secondary | ICD-10-CM

## 2017-12-29 DIAGNOSIS — Z8611 Personal history of tuberculosis: Secondary | ICD-10-CM

## 2017-12-29 DIAGNOSIS — Z7902 Long term (current) use of antithrombotics/antiplatelets: Secondary | ICD-10-CM

## 2017-12-29 DIAGNOSIS — C3432 Malignant neoplasm of lower lobe, left bronchus or lung: Secondary | ICD-10-CM | POA: Diagnosis not present

## 2017-12-29 HISTORY — DX: Pressure ulcer of unspecified site, unspecified stage: L89.90

## 2017-12-29 HISTORY — DX: Urinary tract infection, site not specified: N39.0

## 2017-12-29 HISTORY — DX: Acute kidney failure, unspecified: N17.9

## 2017-12-29 HISTORY — DX: Hypo-osmolality and hyponatremia: E87.1

## 2017-12-29 HISTORY — DX: Calculus of ureter: N20.1

## 2017-12-29 HISTORY — PX: CYSTOSCOPY W/ URETERAL STENT PLACEMENT: SHX1429

## 2017-12-29 HISTORY — DX: Abnormal electrocardiogram (ECG) (EKG): R94.31

## 2017-12-29 HISTORY — DX: Dehydration: E86.0

## 2017-12-29 HISTORY — DX: Hyperkalemia: E87.5

## 2017-12-29 HISTORY — DX: Constipation, unspecified: K59.00

## 2017-12-29 LAB — CBC WITH DIFFERENTIAL/PLATELET
Abs Immature Granulocytes: 0.07 10*3/uL (ref 0.00–0.07)
BASOS ABS: 0 10*3/uL (ref 0.0–0.1)
BASOS PCT: 0 %
EOS ABS: 0 10*3/uL (ref 0.0–0.5)
Eosinophils Relative: 0 %
HCT: 34.7 % — ABNORMAL LOW (ref 39.0–52.0)
Hemoglobin: 11.1 g/dL — ABNORMAL LOW (ref 13.0–17.0)
Immature Granulocytes: 1 %
Lymphocytes Relative: 7 %
Lymphs Abs: 1 10*3/uL (ref 0.7–4.0)
MCH: 31.9 pg (ref 26.0–34.0)
MCHC: 32 g/dL (ref 30.0–36.0)
MCV: 99.7 fL (ref 80.0–100.0)
Monocytes Absolute: 0.5 10*3/uL (ref 0.1–1.0)
Monocytes Relative: 4 %
NRBC: 0 % (ref 0.0–0.2)
Neutro Abs: 11.5 10*3/uL — ABNORMAL HIGH (ref 1.7–7.7)
Neutrophils Relative %: 88 %
PLATELETS: 254 10*3/uL (ref 150–400)
RBC: 3.48 MIL/uL — AB (ref 4.22–5.81)
RDW: 17.2 % — AB (ref 11.5–15.5)
WBC: 13.1 10*3/uL — ABNORMAL HIGH (ref 4.0–10.5)

## 2017-12-29 LAB — URINALYSIS, ROUTINE W REFLEX MICROSCOPIC
Bilirubin Urine: NEGATIVE
Glucose, UA: NEGATIVE mg/dL
Ketones, ur: NEGATIVE mg/dL
NITRITE: NEGATIVE
PROTEIN: 30 mg/dL — AB
Specific Gravity, Urine: 1.025 (ref 1.005–1.030)
pH: 6 (ref 5.0–8.0)

## 2017-12-29 LAB — URINALYSIS, MICROSCOPIC (REFLEX)

## 2017-12-29 LAB — COMPREHENSIVE METABOLIC PANEL
ALK PHOS: 70 U/L (ref 38–126)
ALT: 35 U/L (ref 0–44)
ANION GAP: 10 (ref 5–15)
AST: 90 U/L — ABNORMAL HIGH (ref 15–41)
Albumin: 4.2 g/dL (ref 3.5–5.0)
BUN: 44 mg/dL — ABNORMAL HIGH (ref 8–23)
CALCIUM: 9.4 mg/dL (ref 8.9–10.3)
CO2: 23 mmol/L (ref 22–32)
Chloride: 98 mmol/L (ref 98–111)
Creatinine, Ser: 2.13 mg/dL — ABNORMAL HIGH (ref 0.61–1.24)
GFR calc non Af Amer: 26 mL/min — ABNORMAL LOW (ref >60)
GFR, EST AFRICAN AMERICAN: 30 mL/min — AB (ref >60)
Glucose, Bld: 118 mg/dL — ABNORMAL HIGH (ref 70–99)
Potassium: 5.4 mmol/L — ABNORMAL HIGH (ref 3.5–5.1)
SODIUM: 131 mmol/L — AB (ref 135–145)
TOTAL PROTEIN: 7.8 g/dL (ref 6.5–8.1)
Total Bilirubin: 1.1 mg/dL (ref 0.3–1.2)

## 2017-12-29 SURGERY — CYSTOSCOPY, WITH RETROGRADE PYELOGRAM AND URETERAL STENT INSERTION
Anesthesia: General | Laterality: Left

## 2017-12-29 MED ORDER — ATORVASTATIN CALCIUM 40 MG PO TABS
80.0000 mg | ORAL_TABLET | Freq: Every day | ORAL | Status: DC
  Administered 2017-12-30: 80 mg via ORAL
  Filled 2017-12-29: qty 2

## 2017-12-29 MED ORDER — SODIUM CHLORIDE 0.9 % IV SOLN
INTRAVENOUS | Status: AC
  Administered 2017-12-29: via INTRAVENOUS

## 2017-12-29 MED ORDER — ONDANSETRON HCL 4 MG/2ML IJ SOLN: 4.0000 mg | Freq: Four times a day (QID) | INTRAMUSCULAR | Status: DC | PRN

## 2017-12-29 MED ORDER — ACETAMINOPHEN 325 MG PO TABS: 650.0000 mg | ORAL_TABLET | Freq: Four times a day (QID) | ORAL | Status: DC | PRN

## 2017-12-29 MED ORDER — BISACODYL 10 MG RE SUPP: 10.0000 mg | Freq: Every day | RECTAL | Status: DC | PRN

## 2017-12-29 MED ORDER — LIDOCAINE 2% (20 MG/ML) 5 ML SYRINGE
INTRAMUSCULAR | Status: DC | PRN
  Administered 2017-12-29: 60 mg via INTRAVENOUS

## 2017-12-29 MED ORDER — STERILE WATER FOR IRRIGATION IR SOLN
Status: DC | PRN
  Administered 2017-12-29: 3000 mL

## 2017-12-29 MED ORDER — ONDANSETRON HCL 4 MG PO TABS: 4.0000 mg | ORAL_TABLET | Freq: Four times a day (QID) | ORAL | Status: DC | PRN

## 2017-12-29 MED ORDER — PROPOFOL 10 MG/ML IV BOLUS
INTRAVENOUS | Status: AC
  Filled 2017-12-29: qty 20

## 2017-12-29 MED ORDER — SODIUM CHLORIDE 0.9 % IV SOLN
1.0000 g | INTRAVENOUS | Status: DC
  Administered 2017-12-30: 1 g via INTRAVENOUS
  Filled 2017-12-29: qty 10
  Filled 2017-12-29: qty 1

## 2017-12-29 MED ORDER — ACETAMINOPHEN 650 MG RE SUPP: 650.0000 mg | Freq: Four times a day (QID) | RECTAL | Status: DC | PRN

## 2017-12-29 MED ORDER — SENNA 8.6 MG PO TABS
1.0000 | ORAL_TABLET | Freq: Two times a day (BID) | ORAL | Status: DC
  Administered 2017-12-30: 8.6 mg via ORAL
  Filled 2017-12-29: qty 1

## 2017-12-29 MED ORDER — PHENYLEPHRINE 40 MCG/ML (10ML) SYRINGE FOR IV PUSH (FOR BLOOD PRESSURE SUPPORT)
PREFILLED_SYRINGE | INTRAVENOUS | Status: DC | PRN
  Administered 2017-12-29 (×3): 120 ug via INTRAVENOUS

## 2017-12-29 MED ORDER — SODIUM CHLORIDE 0.9 % IV SOLN
INTRAVENOUS | Status: DC | PRN
  Administered 2017-12-29: 250 mL via INTRAVENOUS

## 2017-12-29 MED ORDER — FENTANYL CITRATE (PF) 100 MCG/2ML IJ SOLN
INTRAMUSCULAR | Status: DC | PRN
  Administered 2017-12-29: 50 ug via INTRAVENOUS

## 2017-12-29 MED ORDER — HYDROCODONE-ACETAMINOPHEN 5-325 MG PO TABS: 1.0000 | ORAL_TABLET | ORAL | Status: DC | PRN

## 2017-12-29 MED ORDER — PROPOFOL 10 MG/ML IV BOLUS
INTRAVENOUS | Status: DC | PRN
  Administered 2017-12-29: 50 mg via INTRAVENOUS

## 2017-12-29 MED ORDER — FENTANYL CITRATE (PF) 100 MCG/2ML IJ SOLN
INTRAMUSCULAR | Status: AC
  Filled 2017-12-29: qty 2

## 2017-12-29 MED ORDER — SODIUM CHLORIDE 0.9 % IV SOLN
1.0000 g | Freq: Once | INTRAVENOUS | Status: AC
  Administered 2017-12-29: 1 g via INTRAVENOUS
  Filled 2017-12-29: qty 10

## 2017-12-29 MED ORDER — LACTATED RINGERS IV SOLN
INTRAVENOUS | Status: DC | PRN
  Administered 2017-12-29: 22:00:00 via INTRAVENOUS

## 2017-12-29 MED ORDER — SODIUM CHLORIDE (PF) 0.9 % IJ SOLN
INTRAMUSCULAR | Status: DC | PRN
  Administered 2017-12-29: 50 mL

## 2017-12-29 MED ORDER — SODIUM CHLORIDE 0.9 % IV BOLUS
500.0000 mL | Freq: Once | INTRAVENOUS | Status: AC
  Administered 2017-12-29: 500 mL via INTRAVENOUS

## 2017-12-29 MED ORDER — SODIUM CHLORIDE 0.9 % IV SOLN
1.0000 g | INTRAVENOUS | Status: AC
  Administered 2017-12-29: 1 g via INTRAVENOUS
  Filled 2017-12-29: qty 1

## 2017-12-29 MED ORDER — SODIUM CHLORIDE 0.9 % IV SOLN
INTRAVENOUS | Status: AC
  Filled 2017-12-29: qty 10

## 2017-12-29 SURGICAL SUPPLY — 13 items
BAG URO CATCHER STRL LF (MISCELLANEOUS) ×2 IMPLANT
CATH URET 5FR 28IN OPEN ENDED (CATHETERS) ×2 IMPLANT
CLOTH BEACON ORANGE TIMEOUT ST (SAFETY) ×2 IMPLANT
COVER WAND RF STERILE (DRAPES) IMPLANT
GLOVE SURG SS PI 8.0 STRL IVOR (GLOVE) IMPLANT
GOWN STRL REUS W/TWL LRG LVL3 (GOWN DISPOSABLE) ×4 IMPLANT
GOWN STRL REUS W/TWL XL LVL3 (GOWN DISPOSABLE) ×2 IMPLANT
GUIDEWIRE STR DUAL SENSOR (WIRE) ×2 IMPLANT
MANIFOLD NEPTUNE II (INSTRUMENTS) ×2 IMPLANT
PACK CYSTO (CUSTOM PROCEDURE TRAY) ×2 IMPLANT
STENT CONTOUR 6FRX24X.038 (STENTS) ×2 IMPLANT
TRAY FOLEY MTR SLVR 16FR STAT (SET/KITS/TRAYS/PACK) ×2 IMPLANT
TUBING CONNECTING 10 (TUBING) ×2 IMPLANT

## 2017-12-29 NOTE — Anesthesia Procedure Notes (Signed)
Procedure Name: LMA Insertion Performed by: Kayler Buckholtz J, CRNA Pre-anesthesia Checklist: Patient identified, Emergency Drugs available, Suction available, Patient being monitored and Timeout performed Patient Re-evaluated:Patient Re-evaluated prior to induction Oxygen Delivery Method: Circle system utilized Preoxygenation: Pre-oxygenation with 100% oxygen Induction Type: IV induction Ventilation: Mask ventilation without difficulty LMA: LMA inserted LMA Size: 4.0 Number of attempts: 1 Placement Confirmation: positive ETCO2 and breath sounds checked- equal and bilateral Tube secured with: Tape Dental Injury: Teeth and Oropharynx as per pre-operative assessment        

## 2017-12-29 NOTE — Consult Note (Signed)
Urology Consult Note   Requesting Attending Physician:  Alma Friendly, MD Service Providing Consult: Urology  Consulting Attending: Irine Seal, MD   Reason for Consult: Left hydronephrosis in setting of ureteral stent and UTI  HPI: Jacob Hampton is seen in consultation for reasons noted above at the request of Alma Friendly, MD for evaluation of left hydronephrosis with left ureteral stent in place and UA c/w UTI.  This is a 84 y.o. male with a history of multiple medical comorbidities including nephrolithiasis. In October, he underwent left ureteral stent placement in [place] by Dr. Thomasene Mohair and subsequent underwent ESWL on 11/06/17.   He was subsequently admitted to [place] center on 11/16/17 for worsened dyspnea and CT PE was negative for PE. However, it did show 4.6 cm ascending thoracic aortic aneurysm, 4.2 x 2.8 cm known left lower lobe lung mass (reportedly declined lung cancer treatment), and a large left renal subcapsular hematoma. Repeat CT demonstrated stable hematoma. Since then, his urologist has followed him expectantly, obtaining repeat imaging to evaluate for passage of steinstrasse in the left proximal ureter. 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 Willaim Bane note. He is scheduled for ESWL on Monday, 12/31/17.   However, he presented to the ED today with constipation and decreased po intake. He endorses nausea, no vomiting. No fevers. He complained of pain in his left abdomen and pain improved after BM in ED. Urinalysis demonstrated many bacteria, > 50 WBCs, 21-50 RBCs, large LE, negative nitrites. WBC 13.1 and Creatinine 2.13 with baseline of 1.0.   The ED admitted him to the hospitalist service for dehydration and possible UTI. Urology was asked to consult due to CT scan today demonstrating hydronephrosis despite ureteral stent in place.   He received 1 g ceftriaxone in the ED.   Past Medical  History: Past Medical History:  Diagnosis Date  . Anemia   . Angina   . Arthritis   . Atrial flutter ([place])   . Blood transfusion    Had 8 units for GI bleed  . CAD (coronary artery disease)    Dr. Wynonia Lawman is his Cardiologist  . Dizzy    "when I take my blood pressure medicine sometimes it makes me a little dizzy"  . GERD (gastroesophageal reflux disease)   . Heart murmur   . History of primary tuberculosis   . History of upper GI bleeding    8 units for GI bleed   . Hyperlipidemia   . Hypertension   . Kidney stones   . Malignant neoplasm of upper lobe, unspecified bronchus or lung ([place])   . Pneumonia    h/o walking pneumonia at least 20 years ago  . Presence of aortocoronary bypass graft   . Presence of coronary angioplasty implant and graft   . Shortness of breath 05/18/11   "sometimes lying down; sometimes w/activity"  . Sinus drainage    11/24/10 "every morning when I get up I have to clear it out"  . Sleep disorder    "trouble staying asleep"  . Tuberculosis 1969   Was in sanitorium for 1 year  . Ventricular premature depolarization     Past Surgical History:  Past Surgical History:  Procedure Laterality Date  . A-FLUTTER ABLATION N/A 09/12/2017   Procedure: A-FLUTTER ABLATION;  Surgeon: Evans Lance, MD;  Location: [facility];  Service: Cardiovascular;  Laterality: N/A;  . APPENDECTOMY    . bleeding ulcer  w/gallbladder  . CATARACT EXTRACTION W/ INTRAOCULAR LENS IMPLANT     bilaterally  . CHOLECYSTECTOMY     "w/bleeding ulcer OR"  . CORONARY ANGIOPLASTY WITH STENT PLACEMENT  02/2011  . CORONARY ARTERY BYPASS GRAFT  03/25/1999   CABG x5 using LIMA to LAD, SVG to Diag, SVG to OM, sequential SVG to PD and RPL, open SV harvest from left thigh and leg  . CORONARY ARTERY BYPASS GRAFT  11/22/2010   Procedure: REDO CORONARY ARTERY BYPASS GRAFTING (CABG)TIMES TWO;  Surgeon: Rexene Alberts, MD;  Location: [facility];  Service: Open Heart Surgery;  Laterality: N/A;   using endoscopically harvested right saphenous vein and left radial artery; Transesophageal echocardiogram  . INGUINAL HERNIA REPAIR     ? left  . LEFT HEART CATHETERIZATION WITH CORONARY/GRAFT ANGIOGRAM N/A 03/01/2011   Procedure: LEFT HEART CATHETERIZATION WITH Beatrix Fetters;  Surgeon: Jettie Booze, MD;  Location: [facility];  Service: Cardiovascular;  Laterality: N/A;  . LEFT HEART CATHETERIZATION WITH CORONARY/GRAFT ANGIOGRAM N/A 05/19/2011   Procedure: LEFT HEART CATHETERIZATION WITH Beatrix Fetters;  Surgeon: Jettie Booze, MD;  Location: [facility] CATH LAB;  Service: Cardiovascular;  Laterality: N/A;  . LITHOTRIPSY    . OTHER SURGICAL HISTORY  03/25/1999   reexploration for bleeding s/p CABG - coagulopathy without mechanical source  . PARTIAL GASTRECTOMY    . PERCUTANEOUS CORONARY STENT INTERVENTION (PCI-S) N/A 03/02/2011   Procedure: PERCUTANEOUS CORONARY STENT INTERVENTION (PCI-S);  Surgeon: Jettie Booze, MD;  Location: [facility] CATH LAB;  Service: Cardiovascular;  Laterality: N/A;  . PERCUTANEOUS CORONARY STENT INTERVENTION (PCI-S)  05/19/2011   Procedure: PERCUTANEOUS CORONARY STENT INTERVENTION (PCI-S);  Surgeon: Jettie Booze, MD;  Location: [facility] C[facility] LAB;  Service: Cardiovascular;;  . RADIAL ARTERY HARVEST  11/22/2010   Procedure: RADIAL ARTERY HARVEST;  Surgeon: Rexene Alberts, MD;  Location: [facility];  Service: Open Heart Surgery;  Laterality: Left;  . Redo CABG  11/22/2010   CABG x2 with left radial to OM and SVG to RCA  . Right testicle surgery      Medication: Current Facility-Administered Medications  Medication Dose Route Frequency Provider Last Rate Last Dose  . 0.9 %  sodium chloride infusion   Intravenous PRN Malvin Johns, MD 10 mL/hr at 12/29/17 1618 250 mL at 12/29/17 1618    Allergies: No Known Allergies  Social History: Social History   Tobacco Use  . Smoking status: Former Smoker    Packs/day: 0.25    Years: 25.00    Pack  years: 6.25    Types: Cigarettes    Last attempt to quit: 01/17/1975    Years since quitting: 42.9  . Smokeless tobacco: Never Used  Substance Use Topics  . Alcohol use: No    Alcohol/week: 0.0 standard drinks  . Drug use: No    Family History Family History  Problem Relation Age of Onset  . Cancer Father   . Unexplained death Mother   . Unexplained death Brother   . Bowel Disease Brother   . Alzheimer's disease Sister   . Cancer Brother   . Stroke Brother   . Heart attack Sister     Review of Systems 10 systems were reviewed and are negative except as noted specifically in the HPI.  Objective   Vital signs in last 24 hours: BP 106/62 (BP Location: Left Arm)   Pulse 90   Temp 98.2 F (36.8 C) (Oral)   Resp 14   Ht 5\' 5"  (1.651 m)  Wt 58.1 kg   SpO2 100%   BMI 21.30 kg/m   Physical Exam General: NAD, A&O, resting, appropriate HEENT: [place]/AT, EOMI, MMM Pulmonary: NWOB on RA Cardiovascular: HDS, adequate peripheral perfusion Abdomen: Soft, mild left abdominal TTP, nondistended.  GU: no CVA tenderness bilaterally Extremities: warm and well perfused Neuro: Appropriate, no focal neurological deficits  Most Recent Labs: Lab Results  Component Value Date   WBC 13.1 (H) 12/29/2017   HGB 11.1 (L) 12/29/2017   HCT 34.7 (L) 12/29/2017   PLT 254 12/29/2017    Lab Results  Component Value Date   NA 131 (L) 12/29/2017   K 5.4 (H) 12/29/2017   CL 98 12/29/2017   CO2 23 12/29/2017   BUN 44 (H) 12/29/2017   CREATININE 2.13 (H) 12/29/2017   CALCIUM 9.4 12/29/2017   MG 2.0 11/28/2010    Lab Results  Component Value Date   INR 1.14 05/18/2011   APTT 30 05/18/2011     IMAGING: Ct Renal Stone Study  Result Date: 12/29/2017 CLINICAL DATA:  Constipation for several days with decreased oral intake. History of tuberculosis. EXAM: CT ABDOMEN AND PELVIS WITHOUT CONTRAST TECHNIQUE: Multidetector CT imaging of the abdomen and pelvis was performed following the  standard protocol without IV contrast. COMPARISON:  Abdominal CT 06/13/2017 and 11/27/2017. Chest CT 11/16/2017 and 05/14/2015. FINDINGS: Lower chest: There is incomplete visualization of a left lower lobe mass seen on the study done last month. This measures up to 3.0 x 2.7 cm on image 1 of series 3. On the most recent examination, this measured up to 4.2 cm and has enlarged from 2017, highly worrisome for bronchogenic carcinoma. No significant residual left pleural effusion. Status post median sternotomy with diffuse aortic and coronary artery atherosclerosis. Hepatobiliary: The liver appears unremarkable in the noncontrast state. No focal hepatic abnormality or biliary dilatation post cholecystectomy. Pancreas: Unremarkable. No pancreatic ductal dilatation or surrounding inflammatory changes. Spleen: Normal in size without focal abnormality. Adrenals/Urinary Tract: Both adrenal glands appear normal. There is bilateral nephrolithiasis. In the lower pole of the right kidney, there is a 15 mm calculus on image 29/2. There is no right-sided hydronephrosis or hydroureter. Left ureteral stent remains in place. There are multiple stone fragments along the course of the ureteral stent. Stone burden in the lower pole calyces of the left kidney has decreased. Dilatation of the left renal pelvis and calyces has mildly increased. Previously demonstrated left subcapsular hematoma has slightly improved. There is a stable cyst in the upper pole of the left kidney. The bladder appears unremarkable. Stomach/Bowel: No evidence of bowel wall thickening, distention or surrounding inflammatory change. Large amount of stool throughout the colon, especially in the rectum. Vascular/Lymphatic: There are no enlarged abdominal or pelvic lymph nodes. Extensive aortic and branch vessel atherosclerosis. 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 Reproductive: Diffuse calcifications are again noted throughout the prostate gland  which is unchanged in size. Other: No evidence of abdominal wall mass or hernia. No ascites. Musculoskeletal: No acute or significant osseous findings. Stable lumbar spondylosis. Probable decubitus changes over both ischial tuberosities without bone destruction or focal fluid collection. IMPRESSION: 1. Partially imaged left lower lobe mass consistent with bronchogenic carcinoma as seen on recent prior studies. 2. Interval increased dilatation of the left renal collecting system and proximal ureter. The left ureteral stent appears well positioned, although there are multiple stone fragments along the course of the stent status post apparent interval lithotripsy, and the stent may be dysfunctional. Right renal calculi  are unchanged. 3. Left renal subcapsular hematoma has improved compared with the previous study. 4. Prominent stool in the rectum.  No evidence of bowel obstruction. 5.  Aortic Atherosclerosis (ICD10-I70.0). Electronically Signed   By: Richardean Sale M.D.   On: 12/29/2017 13:08    ------  Assessment:  84 y.o. male with a history of many medical comorbidities who presents with mild hydronephrosis with left ureteral stent in place after ESWL on 11/06/17. Urinalysis does appear to be consistent with urinary tract infection.   CT scan shows mild hydronephrosis with a relatively decompressed bladder which may indicate stent dysfunction. Additionally, the patient demonstrates an acute kidney injury significantly above baseline. While this may be due to dehydration or drug-induced toxicity, in the setting of hydronephrosis, it is concerning that ureteral obstruction may be contributing to AKI. Finally, patient likely has a urinary tract infection and does have signs of systemic infection with leukocytosis of 13 and tachycardia of 115 at presentation.   I discussed management options with the patient and his family, including continued observation vs left ureteral stent exchange. With the combination  of leukocytosis, tachycardia, UTI and hydronephrosis, observation is considered high risk for clinical deterioration. Risks of left ureteral stent exchange were also discussed, including worsening infection, damage to surrounding organs and blood vessels and low risk of inability to replace stent after removal of current stent. Discussed risks of anesthesia. Recommend left ureteral stent exchange tonight and patient and his family, while concerned about risks of anesthesia, were in agreement.   Recommendations: - Recommend cystoscopy with left ureteral stent exchange tonight.  - Empiric treatment of possible urinary tract infection. Follow-up urine culture and adjust antibiotics appropriately.  - Please keep patient NPO until after procedure.    Discussed with Dr. Jeffie Pollock who is in agreement.  Discussed with patient and his family and they are in agreement.  Thank you for this consult. Please contact the urology consult pager with any further questions/concerns.

## 2017-12-29 NOTE — ED Provider Notes (Addendum)
[place] EMERGENCY DEPARTMENT Provider Note   CSN: [id number] Arrival date & time: 12/29/17  1100     History   Chief Complaint Chief Complaint  Patient presents with  . Constipation    HPI Jacob Hampton is a 84 y.o. male who presents with constipation..  Patient is a 84 year old male who presents with constipation.  He has a history of coronary artery disease, atrial flutter, hypertension, hyperlipidemia and recent kidney stones.  He had a recent lithotripsy from his urologist in [facility]  He supposed to go back on Monday for further treatment.  Over the last several days he has been constipated and unable to have a bowel movement.  He has been using some mag citrate without improvement in symptoms.  Since yesterday he has not really been eating or drinking much.  He has had some nausea but no vomiting.  No known fevers.  He is complaining of pain in his left abdomen.  No urinary symptoms other than he does have some ongoing hematuria related to the recent lithotripsy procedure.     Past Medical History:  Diagnosis Date  . Anemia   . Angina   . Arthritis   . Atrial flutter ([place])   . Blood transfusion    Had 8 units for GI bleed  . CAD (coronary artery disease)    Dr. Wynonia Lawman is his Cardiologist  . Dizzy    "when I take my blood pressure medicine sometimes it makes me a little dizzy"  . GERD (gastroesophageal reflux disease)   . Heart murmur   . History of primary tuberculosis   . History of upper GI bleeding    8 units for GI bleed   . Hyperlipidemia   . Hypertension   . Kidney stones   . Malignant neoplasm of upper lobe, unspecified bronchus or lung ([place])   . Pneumonia    h/o walking pneumonia at least 20 years ago  . Presence of aortocoronary bypass graft   . Presence of coronary angioplasty implant and graft   . Shortness of breath 05/18/11   "sometimes lying down; sometimes w/activity"  . Sinus drainage    11/24/10 "every morning when I get up I  have to clear it out"  . Sleep disorder    "trouble staying asleep"  . Tuberculosis 1969   Was in sanitorium for 1 year  . Ventricular premature depolarization     Patient Active Problem List   Diagnosis Date Noted  . Atrial flutter ([place])   . Presence of aortocoronary bypass graft   . Presence of coronary angioplasty implant and graft   . Ventricular premature depolarization   . History of atrial fibrillation 12/08/2016  . COPD GOLD I  07/05/2015  . Calculus of kidney 06/08/2015  . Solitary pulmonary nodule 05/20/2015  . History of tuberculosis 11/17/2010  . CAD (coronary artery disease) 11/16/2010  . Hyperlipidemia 11/16/2010  . History of upper GI bleeding 11/16/2010  . History of CABG 11/16/2010    Past Surgical History:  Procedure Laterality Date  . A-FLUTTER ABLATION N/A 09/12/2017   Procedure: A-FLUTTER ABLATION;  Surgeon: Evans Lance, MD;  Location: [facility];  Service: Cardiovascular;  Laterality: N/A;  . APPENDECTOMY    . bleeding ulcer     w/gallbladder  . CATARACT EXTRACTION W/ INTRAOCULAR LENS IMPLANT     bilaterally  . CHOLECYSTECTOMY     "w/bleeding ulcer OR"  . CORONARY ANGIOPLASTY WITH STENT PLACEMENT  02/2011  . CORONARY  ARTERY BYPASS GRAFT  03/25/1999   CABG x5 using LIMA to LAD, SVG to Diag, SVG to OM, sequential SVG to PD and RPL, open SV harvest from left thigh and leg  . CORONARY ARTERY BYPASS GRAFT  11/22/2010   Procedure: REDO CORONARY ARTERY BYPASS GRAFTING (CABG)TIMES TWO;  Surgeon: Rexene Alberts, MD;  Location: [facility];  Service: Open Heart Surgery;  Laterality: N/A;  using endoscopically harvested right saphenous vein and left radial artery; Transesophageal echocardiogram  . INGUINAL HERNIA REPAIR     ? left  . LEFT HEART CATHETERIZATION WITH CORONARY/GRAFT ANGIOGRAM N/A 03/01/2011   Procedure: LEFT HEART CATHETERIZATION WITH Beatrix Fetters;  Surgeon: Jettie Booze, MD;  Location: [facility] CATH LAB;  Service: Cardiovascular;   Laterality: N/A;  . LEFT HEART CATHETERIZATION WITH CORONARY/GRAFT ANGIOGRAM N/A 05/19/2011   Procedure: LEFT HEART CATHETERIZATION WITH Beatrix Fetters;  Surgeon: Jettie Booze, MD;  Location: [facility] CATH LAB;  Service: Cardiovascular;  Laterality: N/A;  . LITHOTRIPSY    . OTHER SURGICAL HISTORY  03/25/1999   reexploration for bleeding s/p CABG - coagulopathy without mechanical source  . PARTIAL GASTRECTOMY    . PERCUTANEOUS CORONARY STENT INTERVENTION (PCI-S) N/A 03/02/2011   Procedure: PERCUTANEOUS CORONARY STENT INTERVENTION (PCI-S);  Surgeon: Jettie Booze, MD;  Location: [facility] CATH LAB;  Service: Cardiovascular;  Laterality: N/A;  . PERCUTANEOUS CORONARY STENT INTERVENTION (PCI-S)  05/19/2011   Procedure: PERCUTANEOUS CORONARY STENT INTERVENTION (PCI-S);  Surgeon: Jettie Booze, MD;  Location: [facility] CATH LAB;  Service: Cardiovascular;;  . RADIAL ARTERY HARVEST  11/22/2010   Procedure: RADIAL ARTERY HARVEST;  Surgeon: Rexene Alberts, MD;  Location: [place];  Service: Open Heart Surgery;  Laterality: Left;  . Redo CABG  11/22/2010   CABG x2 with left radial to OM and SVG to RCA  . Right testicle surgery          Home Medications    Prior to Admission medications   Medication Sig Start Date End Date Taking? Authorizing Provider  aspirin EC 81 MG tablet Take 81 mg by mouth at bedtime.    [provider]  atorvastatin (LIPITOR) 80 MG tablet Take 80 mg by mouth at bedtime.     [provider]  clopidogrel (PLAVIX) 75 MG tablet Take 75 mg by mouth at bedtime.    [provider]  ezetimibe (ZETIA) 10 MG tablet Take 10 mg by mouth at bedtime. 08/22/17   [provider]  folic acid (FOLVITE) 1 MG tablet Take 1 mg by mouth at bedtime.     [provider]  nitroGLYCERIN (NITROSTAT) 0.4 MG SL tablet Place 0.4 mg under the tongue every 5 (five) minutes as needed. For chest pain    [provider]  sulfamethoxazole-trimethoprim  (BACTRIM DS,SEPTRA DS) 800-160 MG tablet Take 1 tablet by mouth 2 (two) times daily. for 7 days 11/19/17   [provider]    Family History Family History  Problem Relation Age of Onset  . Cancer Father   . Unexplained death Mother   . Unexplained death Brother   . Bowel Disease Brother   . Alzheimer's disease Sister   . Cancer Brother   . Stroke Brother   . Heart attack Sister     Social History Social History   Tobacco Use  . Smoking status: Former Smoker    Packs/day: 0.25    Years: 25.00    Pack years: 6.25    Types: Cigarettes    Last attempt to  quit: 01/17/1975    Years since quitting: 42.9  . Smokeless tobacco: Never Used  Substance Use Topics  . Alcohol use: No    Alcohol/week: 0.0 standard drinks  . Drug use: No     Allergies   Patient has no known allergies.   Review of Systems Review of Systems  Constitutional: Negative for chills, diaphoresis, fatigue and fever.  HENT: Negative for congestion, rhinorrhea and sneezing.   Eyes: Negative.   Respiratory: Negative for cough, chest tightness and shortness of breath.   Cardiovascular: Negative for chest pain and leg swelling.  Gastrointestinal: Positive for abdominal pain, constipation and nausea. Negative for blood in stool, diarrhea and vomiting.  Genitourinary: Positive for hematuria. Negative for difficulty urinating, flank pain and frequency.  Musculoskeletal: Negative for arthralgias and back pain.  Skin: Negative for rash.  Neurological: Negative for dizziness, speech difficulty, weakness, numbness and headaches.     Physical Exam Updated Vital Signs BP 105/68 (BP Location: Left Arm)   Pulse 88   Temp 98.2 F (36.8 C) (Oral)   Resp 18   Ht 5\' 5"  (1.651 m)   Wt 58.1 kg   SpO2 99%   BMI 21.30 kg/m   Physical Exam Constitutional:      Appearance: He is well-developed.  HENT:     Head: Normocephalic and atraumatic.  Eyes:     Pupils: Pupils are equal, round, and reactive to  light.  Neck:     Musculoskeletal: Normal range of motion and neck supple.  Cardiovascular:     Rate and Rhythm: Normal rate and regular rhythm.     Heart sounds: Normal heart sounds.  Pulmonary:     Effort: Pulmonary effort is normal. No respiratory distress.     Breath sounds: Normal breath sounds. No wheezing or rales.  Chest:     Chest wall: No tenderness.  Abdominal:     General: Bowel sounds are normal.     Palpations: Abdomen is soft.     Tenderness: There is abdominal tenderness in the left upper quadrant and left lower quadrant. There is no guarding or rebound.  Genitourinary:    Comments: Large amount of stool in the rectal vault with no impaction Musculoskeletal: Normal range of motion.  Lymphadenopathy:     Cervical: No cervical adenopathy.  Skin:    General: Skin is warm and dry.     Findings: No rash.  Neurological:     Mental Status: He is alert and oriented to person, place, and time.      ED Treatments / Results  Labs (all labs ordered are listed, but only abnormal results are displayed) Labs Reviewed  COMPREHENSIVE METABOLIC PANEL - Abnormal; Notable for the following components:      Result Value   Sodium 131 (*)    Potassium 5.4 (*)    Glucose, Bld 118 (*)    BUN 44 (*)    Creatinine, Ser 2.13 (*)    AST 90 (*)    GFR calc non Af Amer 26 (*)    GFR calc Af Amer 30 (*)    All other components within normal limits  CBC WITH DIFFERENTIAL/PLATELET - Abnormal; Notable for the following components:   WBC 13.1 (*)    RBC 3.48 (*)    Hemoglobin 11.1 (*)    HCT 34.7 (*)    RDW 17.2 (*)    Neutro Abs 11.5 (*)    All other components within normal limits  URINALYSIS, ROUTINE W REFLEX MICROSCOPIC -  Abnormal; Notable for the following components:   APPearance CLOUDY (*)    Hgb urine dipstick LARGE (*)    Protein, ur 30 (*)    Leukocytes, UA LARGE (*)    All other components within normal limits  URINALYSIS, MICROSCOPIC (REFLEX) - Abnormal; Notable for  the following components:   Bacteria, UA MANY (*)    All other components within normal limits  URINE CULTURE    EKG None  Radiology Ct Renal Stone Study  Result Date: 12/29/2017 CLINICAL DATA:  Constipation for several days with decreased oral intake. History of tuberculosis. EXAM: CT ABDOMEN AND PELVIS WITHOUT CONTRAST TECHNIQUE: Multidetector CT imaging of the abdomen and pelvis was performed following the standard protocol without IV contrast. COMPARISON:  Abdominal CT 06/13/2017 and 11/27/2017. Chest CT 11/16/2017 and 05/14/2015. FINDINGS: Lower chest: There is incomplete visualization of a left lower lobe mass seen on the study done last month. This measures up to 3.0 x 2.7 cm on image 1 of series 3. On the most recent examination, this measured up to 4.2 cm and has enlarged from 2017, highly worrisome for bronchogenic carcinoma. No significant residual left pleural effusion. Status post median sternotomy with diffuse aortic and coronary artery atherosclerosis. Hepatobiliary: The liver appears unremarkable in the noncontrast state. No focal hepatic abnormality or biliary dilatation post cholecystectomy. Pancreas: Unremarkable. No pancreatic ductal dilatation or surrounding inflammatory changes. Spleen: Normal in size without focal abnormality. Adrenals/Urinary Tract: Both adrenal glands appear normal. There is bilateral nephrolithiasis. In the lower pole of the right kidney, there is a 15 mm calculus on image 29/2. There is no right-sided hydronephrosis or hydroureter. Left ureteral stent remains in place. There are multiple stone fragments along the course of the ureteral stent. Stone burden in the lower pole calyces of the left kidney has decreased. Dilatation of the left renal pelvis and calyces has mildly increased. Previously demonstrated left subcapsular hematoma has slightly improved. There is a stable cyst in the upper pole of the left kidney. The bladder appears unremarkable.  Stomach/Bowel: No evidence of bowel wall thickening, distention or surrounding inflammatory change. Large amount of stool throughout the colon, especially in the rectum. Vascular/Lymphatic: There are no enlarged abdominal or pelvic lymph nodes. Extensive aortic and branch vessel atherosclerosis. 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 Reproductive: Diffuse calcifications are again noted throughout the prostate gland which is unchanged in size. Other: No evidence of abdominal wall mass or hernia. No ascites. Musculoskeletal: No acute or significant osseous findings. Stable lumbar spondylosis. Probable decubitus changes over both ischial tuberosities without bone destruction or focal fluid collection. IMPRESSION: 1. Partially imaged left lower lobe mass consistent with bronchogenic carcinoma as seen on recent prior studies. 2. Interval increased dilatation of the left renal collecting system and proximal ureter. The left ureteral stent appears well positioned, although there are multiple stone fragments along the course of the stent status post apparent interval lithotripsy, and the stent may be dysfunctional. Right renal calculi are unchanged. 3. Left renal subcapsular hematoma has improved compared with the previous study. 4. Prominent stool in the rectum.  No evidence of bowel obstruction. 5.  Aortic Atherosclerosis (ICD10-I70.0). Electronically Signed   By: Richardean Sale M.D.   On: 12/29/2017 13:08    Procedures Procedures (including critical care time)  Medications Ordered in ED Medications  cefTRIAXone (ROCEPHIN) 1 g in sodium chloride 0.9 % 100 mL IVPB (1 g Intravenous New Bag/Given 12/29/17 1619)  0.9 %  sodium chloride infusion (250 mLs Intravenous  New Bag/Given 12/29/17 1618)  sodium chloride 0.9 % bolus 500 mL (0 mLs Intravenous Stopped 12/29/17 1406)     Initial Impression / Assessment and Plan / ED Course  I have reviewed the triage vital signs and the nursing  notes.  Pertinent labs & imaging results that were available during my care of the patient were reviewed by me and considered in my medical decision making (see chart for details).     Patient is a 84 year old male who presents with left lower abdominal pain and associated constipation.  He was given a soapsuds enema and his pain is improved.  CT scan was performed given his recent lithotripsy and this shows an intact ureteral stent.  However there is some stone debris in the stent and some mildly increased hydronephrosis.  His urine does appear infected.  His white count is elevated.  He has an acute kidney injury with mild elevation in his potassium.  He was given IV fluids and Rocephin.  His urine was sent for culture.  He does not appear to be septic.  He has no hypotension.  His pain has improved after the enema.  He had a large bowel movement.  The family was pretty adamant about being admitted to [facility] however they have no beds and not excepting outside patients at this time.  I explained this to the patient and they decided they wanted to go to [facility].  However in discussion with the hospitalist at [facility] and the urologist, Dr. Carlean Purl, it is preferred that patient go to [facility] in case he has to have a urologic procedure.  I rediscussed this with the family and they are amenable to this.  Final Clinical Impressions(s) / ED Diagnoses   Final diagnoses:  Constipation, unspecified constipation type  Hydronephrosis with renal and ureteral calculus obstruction  Urinary tract infection with hematuria, site unspecified    ED Discharge Orders    None       Malvin Johns, MD 12/29/17 1633    Malvin Johns, MD 12/29/17 [phone]

## 2017-12-29 NOTE — Anesthesia Preprocedure Evaluation (Addendum)
Anesthesia Evaluation  Patient identified by MRN, date of birth, ID band Patient awake    Reviewed: Allergy & Precautions, H&P , NPO status , Patient's Chart, lab work & pertinent test results  Airway Mallampati: I  TM Distance: >3 FB Neck ROM: Full    Dental no notable dental hx. (+) Edentulous Upper, Edentulous Lower, Dental Advisory Given   Pulmonary COPD, former smoker,    Pulmonary exam normal breath sounds clear to auscultation       Cardiovascular hypertension, + CAD, + Cardiac Stents and + CABG   Rhythm:Regular Rate:Normal     Neuro/Psych negative neurological ROS  negative psych ROS   GI/Hepatic Neg liver ROS, GERD  ,  Endo/Other  negative endocrine ROS  Renal/GU Renal disease  negative genitourinary   Musculoskeletal  (+) Arthritis , Osteoarthritis,    Abdominal   Peds  Hematology negative hematology ROS (+)   Anesthesia Other Findings   Reproductive/Obstetrics negative OB ROS                            Anesthesia Physical Anesthesia Plan  ASA: III  Anesthesia Plan: General   Post-op Pain Management:    Induction: Intravenous  PONV Risk Score and Plan: 3 and Ondansetron, Treatment may vary due to age or medical condition and Dexamethasone  Airway Management Planned: LMA  Additional Equipment:   Intra-op Plan:   Post-operative Plan: Extubation in OR  Informed Consent: I have reviewed the patients History and Physical, chart, labs and discussed the procedure including the risks, benefits and alternatives for the proposed anesthesia with the patient or authorized representative who has indicated his/her understanding and acceptance.   Dental advisory given  Plan Discussed with: CRNA  Anesthesia Plan Comments:         Anesthesia Quick Evaluation

## 2017-12-29 NOTE — ED Notes (Signed)
C/o constipation since thursday night,  Pt is having "small " stool leakage  Denies pain

## 2017-12-29 NOTE — ED Notes (Signed)
Soap sud enema administered- pt had leakage but no full BM. Will reassess. Pt tolerated well.

## 2017-12-29 NOTE — Op Note (Addendum)
PATIENT:  Jacob Hampton  Preoperative diagnosis:  1. Nephrolithiasis 2. Left ureteral obstruction with hydronephrosis  Postoperative diagnosis:  1.   Nephrolithiasis 2.   Left ureteral obstruction with hydronephrosis  Procedure:  1. Cystoscopy 2. Left ureteral stent placement (6 Fr x 24 cm without dangler)   Surgeon: Irine Seal, M.D.  Resident: Andee Poles, MD  Anesthesia: General  Complications: None  EBL: Minimal  Specimens: None  Indication: Jacob Hampton is a 84 y.o. male with a history of nephrolithiasis and urinar tract infections who presented with likely UTI, hydronephrosis in setting of previously placed ureteral stent and obstructing kidney stones, and acute kidney injury. Given these findings, stent was thought to be malfunctioning and stent exchange was recommended. After reviewing the management options for treatment, they have elected to proceed with the above surgical procedure(s). We have discussed the potential benefits and risks of the procedure, side effects of the proposed treatment, the likelihood of the patient achieving the goals of the procedure, and any potential problems that might occur during the procedure or recuperation. Informed consent has been obtained.  Findings:  Unable to remove previously placed ureteral stent; proximal curl would not pass ureteral stones. Second stent placed alongside the first with proximal curl in the kidney. At completion of the procedure, patient has two stents in the left ureter.   Description of procedure:   The patient was taken to the operating room and general anesthesia was induced.  The patient was placed in the dorsal lithotomy position, prepped and draped in the usual sterile fashion, and preoperative antibiotics were administered. A preoperative time-out was performed.   Cystourethroscopy was performed.  The patient's urethra was examined and was normal. The bladder was then systematically examined in its  entirety. There was no evidence for any bladder tumors, stones, or other mucosal pathology.    Attention then turned to the left ureteral orifice with ureteral stent emanating through the orifice. Using the string and under direct visualization of the cystoscope, the stent was attempted to be pulled to the urethral meatus. Resistance was met with the distal end in the penile urethra and fluoroscopy demonstrated the proximal curl just proximal to the ureteral stones. Given risk of ureteral injury, the stent was not removed. Using the assistance of a 5 Fr open ended catheter, a sensor wire was passed into the renal pelvis. A 25f open ended catheter was advanced to the pelvis. A proximal culture was obtained.   A Sensor guidewire was then re-advanced to the left renal pelvis under fluoroscopic guidance. Next, a [id number] 24 cm JJ ureteral stent without dangler was advanced into the renal pelvis. The stent was positioned appropriately under fluoroscopic and cystoscopic guidance.  The wire was then removed with an adequate stent curl noted in the renal pelvis as well as in the bladder. String attached to original stent was placed in the bladder.   The bladder was then emptied and the procedure ended.  The patient appeared to tolerate the procedure well and without complications.  The patient was able to be awakened and transferred to the recovery unit in satisfactory condition.   Plan:  - Continue IV antibiotics, follow-up urine culture results and tailor antibiotics appropriately. - Continue to monitor creatinine to ensure improvement. - Patient to follow-up with Dr. CoThomasene Mohairor definitive stone management.    CC: Dr. PaFenton Malling

## 2017-12-29 NOTE — ED Triage Notes (Addendum)
Pt c/o constipation for several days. Family states they gave him mag citrate on Thursday without relief. Family also reports decreased PO intake and concern about dehydration.

## 2017-12-29 NOTE — ED Notes (Signed)
ED Provider at bedside. 

## 2017-12-29 NOTE — Anesthesia Postprocedure Evaluation (Signed)
Anesthesia Post Note  Patient: Theatre stage manager  Procedure(s) Performed: CYSTOSCOPY WITH RETROGRADE PYELOGRAM/URETERAL STENT EXCHANGE (Left )     Patient location during evaluation: PACU Anesthesia Type: General Level of consciousness: awake and alert Pain management: pain level controlled Vital Signs Assessment: post-procedure vital signs reviewed and stable Respiratory status: spontaneous breathing, nonlabored ventilation, respiratory function stable and patient connected to nasal cannula oxygen Cardiovascular status: blood pressure returned to baseline and stable Postop Assessment: no apparent nausea or vomiting Anesthetic complications: no    Last Vitals:  Vitals:   12/29/17 2114 12/29/17 2233  BP:    Pulse: 80   Resp: 12   Temp:  (!) 35.9 C  SpO2: 98%     Last Pain:  Vitals:   12/29/17 2233  TempSrc:   PainSc: 0-No pain                 Camren Lipsett,W. EDMOND

## 2017-12-29 NOTE — H&P (Signed)
Jacob Hampton IRJ:[id number] DOB: 1924-10-29 DOA: 12/29/2017     PCP: Patient, No Pcp Per   Outpatient Specialists:  CARDS: Dr. Agustin Cree, Marily Lente, MD Dr. Wynonia Lawman  Urology Dr. Thomasene Mohair  Patient arrived to ER on 12/29/17 at 1100  Patient coming from: home Lives  With family  Chief Complaint:  Chief Complaint  Patient presents with  . Constipation    HPI: Jacob Hampton is a 84 y.o. male with medical history significant of left ureteral calculi, hyperlipidemia hypertension, CAD status post CABG, kidney stones with multiple stent placements, a flutter now in sinus, Lung mass declined treatment    Presented with constipation for several days family attempted to use mag citrate 3 days ago with no relief he has not been eating much and family became concerned he become dehydrated.  He has been having small stools but not able to have a normal BM.  Recently has been admitted to [facility] on 1 November for worsening shortness of breath at that time CT PE was negative for PE patient was found to have 4.6 cm ascending thoracic aortic aneurysm as well as left lower lobe lung mass that has been known but in the past patient has refused treatment also noted to have large left renal subcapsular hematoma Repeat CT scan showed stable hematoma.  He was followed by urology with observation  Have had extensive history of kidney stones requiring recurrent stent placements on the left by urology at [facility]   Urologist has obtained repeat imaging to evaluate for passage of steinstrasse in the left proximal ureter. 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 Willaim Bane note. He is scheduled for ESWL on Monday, 12/31/17.   Status post recent lithotripsy by urologist at [facility] the plan is for him to return on Monday for further treatment Recently was started on Bactrim by urology on 4 December  He reports nausea no vomiting no fevers he has been  having worsening pain in the left flank and left lower abdomen no urinary complaints otherwise but has ongoing hematuria since his recent lithotripsy procedure  Regarding pertinent Chronic problems: Re Hx of CAD status post coronary artery bypass graft x2, status post angioplasties history of atrial flutter with cardioversion to sinus rhythm  now maintaining sinus rhythm he is not anticoagulated because of history of significant GI bleeding he is on aspirin Plavix. While in ER: She presented to [place] was given soapsuds enema had some leakage but no full bowel movement  Noted to have weighted potassium up to 5.4 CT obtained showed prominent stool and question whether his left ureteral stent was functioning given interval increase in dilation of the left renal collecting system and proximal ureter. Creatinine noted to be up from baseline suggestive AKI  UA was worrisome for urinary tract infection urology was consulted who requested transfer to [facility] hospital Patient was started on Rocephin for presumed UTI in the setting of stent placement  The following Work up has been ordered so far:  Orders Placed This Encounter  Procedures  . Urine culture  . CT Renal Stone Study  . Comprehensive metabolic panel  . CBC with Differential  . Urinalysis, Routine w reflex microscopic  . Urinalysis, Microscopic (reflex)  . Soap suds enema  . Vital signs  . Cardiac monitoring  . Consult to hospitalist  . Consult to urology  . Place in observation (patient's expected length of stay will be less  than 2 midnights)    Following Medications were ordered in ER: Medications  0.9 %  sodium chloride infusion (250 mLs Intravenous New Bag/Given 12/29/17 1618)  sodium chloride 0.9 % bolus 500 mL (0 mLs Intravenous Stopped 12/29/17 1406)  cefTRIAXone (ROCEPHIN) 1 g in sodium chloride 0.9 % 100 mL IVPB ( Intravenous Stopped 12/29/17 1650)    Significant initial  Findings: Abnormal Labs  Reviewed  COMPREHENSIVE METABOLIC PANEL - Abnormal; Notable for the following components:      Result Value   Sodium 131 (*)    Potassium 5.4 (*)    Glucose, Bld 118 (*)    BUN 44 (*)    Creatinine, Ser 2.13 (*)    AST 90 (*)    GFR calc non Af Amer 26 (*)    GFR calc Af Amer 30 (*)    All other components within normal limits  CBC WITH DIFFERENTIAL/PLATELET - Abnormal; Notable for the following components:   WBC 13.1 (*)    RBC 3.48 (*)    Hemoglobin 11.1 (*)    HCT 34.7 (*)    RDW 17.2 (*)    Neutro Abs 11.5 (*)    All other components within normal limits  URINALYSIS, ROUTINE W REFLEX MICROSCOPIC - Abnormal; Notable for the following components:   APPearance CLOUDY (*)    Hgb urine dipstick LARGE (*)    Protein, ur 30 (*)    Leukocytes, UA LARGE (*)    All other components within normal limits  URINALYSIS, MICROSCOPIC (REFLEX) - Abnormal; Notable for the following components:   Bacteria, UA MANY (*)    All other components within normal limits     Lactic Acid, Venous    Component Value Date/Time   LATICACIDVEN 1.21 11/16/2017 1516    Na 131  K 5.4  Cr    Up from baseline see below Lab Results  Component Value Date   CREATININE 2.13 (H) 12/29/2017   CREATININE 1.02 11/16/2017   CREATININE 1.09 08/14/2017      WBC  13.1  HG/HCT  stable,       Component Value Date/Time   HGB 11.1 (L) 12/29/2017 1226   HGB 10.7 (L) 11/23/2017 1452   HCT 34.7 (L) 12/29/2017 1226   HCT 33.3 (L) 11/23/2017 1452     UA  evidence of UTI       CTabd/pelvis -likely bronchogenic carcinoma, left-sided hydronephrosis, large amount of stool in the rectum  ECG:  Personally reviewed by me showing: HR : 83 Rhythm:  NSR,   RBBB, with PAC's   no evidence of ischemic changes QTC 505  ED Triage Vitals  Enc Vitals Group     BP 12/29/17 1119 (!) 151/88     Pulse Rate 12/29/17 1119 (!) 114     Resp 12/29/17 1119 18     Temp 12/29/17 1119 98.2 F (36.8 C)     Temp Source  12/29/17 1119 Oral     SpO2 12/29/17 1119 100 %     Weight 12/29/17 1119 128 lb (58.1 kg)     Height 12/29/17 1119 5\' 5"  (1.651 m)     Head Circumference --      Peak Flow --      Pain Score 12/29/17 1158 0     Pain Loc --      Pain Edu? --      Excl. in [place]? --   TMAX(24)@       Latest  Blood pressure 107/66, pulse 81,  temperature 97.9 F (36.6 C), temperature source Oral, resp. rate 18, height 5\' 5"  (1.651 m), weight 58.1 kg, SpO2 98 %.    ER Provider Called:  Urology      Hospitalist was called for admission for AKI secondary to dehydration, hydronephrosis, obstipation   Review of Systems:    Pertinent positives include: fatigue, Decreased appetite, and separation  Constitutional:  No weight loss, night sweats, Fevers, chills,   weight loss  HEENT:  No headaches, Difficulty swallowing,Tooth/dental problems, Sore throat,  No sneezing, itching, ear ache, nasal congestion, post nasal drip,  Cardio-vascular:  No chest pain, Orthopnea, PND, anasarca, dizziness, palpitations. no Bilateral lower extremity swelling  GI:  No heartburn, indigestion, abdominal pain, nausea, vomiting, diarrhea, change in bowel habits, loss of appetite, melena, blood in stool, hematemesis Resp:  no shortness of breath at rest. No dyspnea on exertion, No excess mucus, no productive cough, No non-productive cough, No coughing up of blood.No change in color of mucus.No wheezing. Skin:  no rash or lesions. No jaundice GU:  no dysuria, change in color of urine, no urgency or frequency. No straining to urinate.  No flank pain.  Musculoskeletal:  No joint pain or no joint swelling. No decreased range of motion. No back pain.  Psych:  No change in mood or affect. No depression or anxiety. No memory loss.  Neuro: no localizing neurological complaints, no tingling, no weakness, no double vision, no gait abnormality, no slurred speech, no confusion  All systems reviewed and apart from [place] all are  negative  Past Medical History:   Past Medical History:  Diagnosis Date  . Anemia   . Angina   . Arthritis   . Atrial flutter ([place])   . Blood transfusion    Had 8 units for GI bleed  . CAD (coronary artery disease)    Dr. Wynonia Lawman is his Cardiologist  . Dizzy    "when I take my blood pressure medicine sometimes it makes me a little dizzy"  . GERD (gastroesophageal reflux disease)   . Heart murmur   . History of primary tuberculosis   . History of upper GI bleeding    8 units for GI bleed   . Hyperlipidemia   . Hypertension   . Kidney stones   . Malignant neoplasm of upper lobe, unspecified bronchus or lung ([place])   . Pneumonia    h/o walking pneumonia at least 20 years ago  . Presence of aortocoronary bypass graft   . Presence of coronary angioplasty implant and graft   . Shortness of breath 05/18/11   "sometimes lying down; sometimes w/activity"  . Sinus drainage    11/24/10 "every morning when I get up I have to clear it out"  . Sleep disorder    "trouble staying asleep"  . Tuberculosis 1969   Was in sanitorium for 1 year  . Ventricular premature depolarization       Past Surgical History:  Procedure Laterality Date  . A-FLUTTER ABLATION N/A 09/12/2017   Procedure: A-FLUTTER ABLATION;  Surgeon: Evans Lance, MD;  Location: [facility];  Service: Cardiovascular;  Laterality: N/A;  . APPENDECTOMY    . bleeding ulcer     w/gallbladder  . CATARACT EXTRACTION W/ INTRAOCULAR LENS IMPLANT     bilaterally  . CHOLECYSTECTOMY     "w/bleeding ulcer OR"  . CORONARY ANGIOPLASTY WITH STENT PLACEMENT  02/2011  . CORONARY ARTERY BYPASS GRAFT  03/25/1999   CABG x5 using LIMA to LAD, SVG  to Diag, SVG to OM, sequential SVG to PD and RPL, open SV harvest from left thigh and leg  . CORONARY ARTERY BYPASS GRAFT  11/22/2010   Procedure: REDO CORONARY ARTERY BYPASS GRAFTING (CABG)TIMES TWO;  Surgeon: Rexene Alberts, MD;  Location: [place];  Service: Open Heart Surgery;  Laterality:  N/A;  using endoscopically harvested right saphenous vein and left radial artery; Transesophageal echocardiogram  . INGUINAL HERNIA REPAIR     ? left  . LEFT HEART CATHETERIZATION WITH CORONARY/GRAFT ANGIOGRAM N/A 03/01/2011   Procedure: LEFT HEART CATHETERIZATION WITH Beatrix Fetters;  Surgeon: Jettie Booze, MD;  Location: [facility] CATH LAB;  Service: Cardiovascular;  Laterality: N/A;  . LEFT HEART CATHETERIZATION WITH CORONARY/GRAFT ANGIOGRAM N/A 05/19/2011   Procedure: LEFT HEART CATHETERIZATION WITH Beatrix Fetters;  Surgeon: Jettie Booze, MD;  Location: [facility] CATH LAB;  Service: Cardiovascular;  Laterality: N/A;  . LITHOTRIPSY    . OTHER SURGICAL HISTORY  03/25/1999   reexploration for bleeding s/p CABG - coagulopathy without mechanical source  . PARTIAL GASTRECTOMY    . PERCUTANEOUS CORONARY STENT INTERVENTION (PCI-S) N/A 03/02/2011   Procedure: PERCUTANEOUS CORONARY STENT INTERVENTION (PCI-S);  Surgeon: Jettie Booze, MD;  Location: [facility] CATH LAB;  Service: Cardiovascular;  Laterality: N/A;  . PERCUTANEOUS CORONARY STENT INTERVENTION (PCI-S)  05/19/2011   Procedure: PERCUTANEOUS CORONARY STENT INTERVENTION (PCI-S);  Surgeon: Jettie Booze, MD;  Location: [facility] CATH LAB;  Service: Cardiovascular;;  . RADIAL ARTERY HARVEST  11/22/2010   Procedure: RADIAL ARTERY HARVEST;  Surgeon: Rexene Alberts, MD;  Location: [place];  Service: Open Heart Surgery;  Laterality: Left;  . Redo CABG  11/22/2010   CABG x2 with left radial to OM and SVG to RCA  . Right testicle surgery      Social History:  Ambulatory independently      reports that he quit smoking about 42 years ago. His smoking use included cigarettes. He has a 6.25 pack-year smoking history. He has never used smokeless tobacco. He reports that he does not drink alcohol or use drugs.    Family History:   Family History  Problem Relation Age of Onset  . Cancer Father   . Unexplained death Mother   .  Unexplained death Brother   . Bowel Disease Brother   . Alzheimer's disease Sister   . Cancer Brother   . Stroke Brother   . Heart attack Sister     Allergies: No Known Allergies   Prior to Admission medications   Medication Sig Start Date End Date Taking? Authorizing Provider  aspirin EC 81 MG tablet Take 81 mg by mouth at bedtime.    [provider]  atorvastatin (LIPITOR) 80 MG tablet Take 80 mg by mouth at bedtime.     [provider]  clopidogrel (PLAVIX) 75 MG tablet Take 75 mg by mouth at bedtime.    [provider]  ezetimibe (ZETIA) 10 MG tablet Take 10 mg by mouth at bedtime. 08/22/17   [provider]  folic acid (FOLVITE) 1 MG tablet Take 1 mg by mouth at bedtime.     [provider]  nitroGLYCERIN (NITROSTAT) 0.4 MG SL tablet Place 0.4 mg under the tongue every 5 (five) minutes as needed. For chest pain    [provider]  sulfamethoxazole-trimethoprim (BACTRIM DS,SEPTRA DS) 800-160 MG tablet Take 1 tablet by mouth 2 (two) times daily. for 7 days 11/19/17   [provider]   Physical Exam: Blood pressure 107/66, pulse  81, temperature 97.9 F (36.6 C), temperature source Oral, resp. rate 18, height 5\' 5"  (1.651 m), weight 58.1 kg, SpO2 98 %. 1. General:  in No Acute distress   Chronically ill  -appearing frail thin 2. Psychological: Alert and    Oriented 3. Head/ENT:   Dry Mucous Membranes                          Head Non traumatic, neck supple                           Poor Dentition 4. SKIN:   decreased Skin turgor,  Skin clean Dry and intact no rash 5. Heart: Regular rate and rhythm no  Murmur, no Rub or gallop 6. Lungs: Clear to auscultation bilaterally, no wheezes or crackles   7. Abdomen: Soft, non-tender, Non distended bowel sounds present 8. Lower extremities: no clubbing, cyanosis, or edema 10. MSK: Normal range of motion   LABS:     Recent Labs  Lab 12/29/17 1226  WBC 13.1*  NEUTROABS  11.5*  HGB 11.1*  HCT 34.7*  MCV 99.7  PLT 814   Basic Metabolic Panel: Recent Labs  Lab 12/29/17 1226  NA 131*  K 5.4*  CL 98  CO2 23  GLUCOSE 118*  BUN 44*  CREATININE 2.13*  CALCIUM 9.4      Recent Labs  Lab 12/29/17 1226  AST 90*  ALT 35  ALKPHOS 70  BILITOT 1.1  PROT 7.8  ALBUMIN 4.2   No results for input(s): LIPASE, AMYLASE in the last 168 hours. No results for input(s): AMMONIA in the last 168 hours.    HbA1C: No results for input(s): HGBA1C in the last 72 hours. CBG: No results for input(s): GLUCAP in the last 168 hours.    Urine analysis:    Component Value Date/Time   COLORURINE YELLOW 12/29/2017 1500   APPEARANCEUR CLOUDY (A) 12/29/2017 1500   LABSPEC 1.025 12/29/2017 1500   PHURINE 6.0 12/29/2017 1500   GLUCOSEU NEGATIVE 12/29/2017 1500   HGBUR LARGE (A) 12/29/2017 1500   BILIRUBINUR NEGATIVE 12/29/2017 1500   KETONESUR NEGATIVE 12/29/2017 1500   PROTEINUR 30 (A) 12/29/2017 1500   UROBILINOGEN 1.0 05/18/2011 1047   NITRITE NEGATIVE 12/29/2017 1500   LEUKOCYTESUR LARGE (A) 12/29/2017 1500     Cultures:    Component Value Date/Time   SDES  11/16/2017 1604    URINE, CLEAN CATCH Performed at [facility], [address], [place], [place] [address]    [facility]  11/16/2017 1604    NONE Performed at [facility], [address] [place], [place] [address]    CULT >=100,000 COLONIES/mL STAPHYLOCOCCUS EPIDERMIDIS (A) 11/16/2017 1604   REPTSTATUS 11/19/2017 FINAL 11/16/2017 1604     Radiological Exams on Admission: Ct Renal Stone Study  Result Date: 12/29/2017 CLINICAL DATA:  Constipation for several days with decreased oral intake. History of tuberculosis. EXAM: CT ABDOMEN AND PELVIS WITHOUT CONTRAST TECHNIQUE: Multidetector CT imaging of the abdomen and pelvis was performed following the standard protocol without IV contrast. COMPARISON:  Abdominal CT 06/13/2017 and 11/27/2017. Chest CT 11/16/2017 and 05/14/2015.  FINDINGS: Lower chest: There is incomplete visualization of a left lower lobe mass seen on the study done last month. This measures up to 3.0 x 2.7 cm on image 1 of series 3. On the most recent examination, this measured up to 4.2 cm and has  enlarged from 2017, highly worrisome for bronchogenic carcinoma. No significant residual left pleural effusion. Status post median sternotomy with diffuse aortic and coronary artery atherosclerosis. Hepatobiliary: The liver appears unremarkable in the noncontrast state. No focal hepatic abnormality or biliary dilatation post cholecystectomy. Pancreas: Unremarkable. No pancreatic ductal dilatation or surrounding inflammatory changes. Spleen: Normal in size without focal abnormality. Adrenals/Urinary Tract: Both adrenal glands appear normal. There is bilateral nephrolithiasis. In the lower pole of the right kidney, there is a 15 mm calculus on image 29/2. There is no right-sided hydronephrosis or hydroureter. Left ureteral stent remains in place. There are multiple stone fragments along the course of the ureteral stent. Stone burden in the lower pole calyces of the left kidney has decreased. Dilatation of the left renal pelvis and calyces has mildly increased. Previously demonstrated left subcapsular hematoma has slightly improved. There is a stable cyst in the upper pole of the left kidney. The bladder appears unremarkable. Stomach/Bowel: No evidence of bowel wall thickening, distention or surrounding inflammatory change. Large amount of stool throughout the colon, especially in the rectum. Vascular/Lymphatic: There are no enlarged abdominal or pelvic lymph nodes. Extensive aortic and branch vessel atherosclerosis. 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 Reproductive: Diffuse calcifications are again noted throughout the prostate gland which is unchanged in size. Other: No evidence of abdominal wall mass or hernia. No ascites. Musculoskeletal: No acute or  significant osseous findings. Stable lumbar spondylosis. Probable decubitus changes over both ischial tuberosities without bone destruction or focal fluid collection. IMPRESSION: 1. Partially imaged left lower lobe mass consistent with bronchogenic carcinoma as seen on recent prior studies. 2. Interval increased dilatation of the left renal collecting system and proximal ureter. The left ureteral stent appears well positioned, although there are multiple stone fragments along the course of the stent status post apparent interval lithotripsy, and the stent may be dysfunctional. Right renal calculi are unchanged. 3. Left renal subcapsular hematoma has improved compared with the previous study. 4. Prominent stool in the rectum.  No evidence of bowel obstruction. 5.  Aortic Atherosclerosis (ICD10-I70.0). Electronically Signed   By: Richardean Sale M.D.   On: 12/29/2017 13:08    Chart has been reviewed      Assessment/Plan  84 y.o. male with medical history significant of left ureteral calculi, hyperlipidemia hypertension, CAD status post CABG, kidney stones with multiple stent placements, a flutter now in sinus, Lung mass declined treatment   Admitted for AKI secondary to dehydration, hydronephrosis, obstipation    Present on Admission: . Left ureteral stone -appreciate urology input, plan for or intervention tonight given tachycardia evidence of UTI in the setting of ureteral stent . CAD (coronary artery disease) -  - chronic, hold  Aspirin continue  Statin not on  beta blocker , Plavix on hold while may need further procedures  . Hyperlipidemia -stable continue statin . COPD GOLD I  -currently stable continue to monitor . Atrial flutter ([place]) -on a candidate for anticoagulation given history of GI bleed and instability while walking.  Has undergone cardioversion in the past currently in sinus rhythm . AKI (acute kidney injury) ([place]) -creatinine elevated in the setting of recent Bactrim use but also  evidence of dehydration will rehydrate and continue to follow it  . Dehydration hydrate with gentle IV fluids . Hyponatremia -in setting of dehydration will rehydrate and monitor . Acute lower UTI -  - treat with Rocephin given leukocytosis elevated heart rate initially and stent in place  await results of urine culture and adjust antibiotic coverage as needed  . Hyperkalemia -patient recently has been on Bactrim.  This can artificially increased potassium levels.  Will monitor on telemetry recheck and treat as needed . Obstipation - had BM at [facility] will repeat KUB in a.m. spoke to  [facility] regarding prevention of constipation  Prolonged QT - - will monitor on tele avoid QT prolonging medications, rehydrate correct electrolytes  History of lung mass.  -Spoke with family at this point patient has elected to hold off on further treatment or evaluation.  Mention confirming this most likely being bronchogenic carcinoma spoke with family regarding overall disease progression of untreated lung cancer  Other plan as per orders.  DVT prophylaxis:  SCD    Code Status:  FULL CODE  as per patient   I had personally discussed CODE STATUS with patient and family  Family Communication:   Family  at  Bedside  plan of care was discussed with   Sons, Daughter and [place] daughter,  Disposition Plan:      To home once workup is complete and patient is stable                     Would benefit from PT/OT eval prior to DC  Ordered                                               Nutrition    consulted                                   Consults called: Urology    Admission status:  Obs    Level of care    tele  For 12H         Crescentia Boutwell 12/29/2017, 8:50 PM    [facility]  Pager [phone]   after 2 AM please page floor coverage PA If 7AM-7PM, please contact the day team taking care of the patient  [email or web address]  Password [email or web address]

## 2017-12-29 NOTE — ED Notes (Signed)
[facility] arrived to transport pt to [facility] 

## 2017-12-29 NOTE — Transfer of Care (Signed)
Immediate Anesthesia Transfer of Care Note  Patient: Jacob Hampton  Procedure(s) Performed: CYSTOSCOPY WITH RETROGRADE PYELOGRAM/URETERAL STENT EXCHANGE (Left )  Patient Location: PACU  Anesthesia Type:General  Level of Consciousness: awake, alert  and oriented  Airway & Oxygen Therapy: Patient Spontanous Breathing and Patient connected to face mask oxygen  Post-op Assessment: Report given to RN and Post -op Vital signs reviewed and unstable, Anesthesiologist notified  Post vital signs: Reviewed and stable  Last Vitals:  Vitals Value Taken Time  BP 124/70 12/29/2017 10:33 PM  Temp    Pulse 74 12/29/2017 10:36 PM  Resp 10 12/29/2017 10:36 PM  SpO2 100 % 12/29/2017 10:36 PM  Vitals shown include unvalidated device data.  Last Pain:  Vitals:   12/29/17 2104  TempSrc: Oral  PainSc: 0-No pain         Complications: No apparent anesthesia complications

## 2017-12-29 NOTE — ED Notes (Signed)
Pt given urinal, unable to void at this time

## 2017-12-30 ENCOUNTER — Observation Stay (HOSPITAL_COMMUNITY): Payer: Medicare Other

## 2017-12-30 ENCOUNTER — Encounter (HOSPITAL_COMMUNITY): Payer: Self-pay | Admitting: Urology

## 2017-12-30 DIAGNOSIS — E440 Moderate protein-calorie malnutrition: Secondary | ICD-10-CM | POA: Diagnosis present

## 2017-12-30 DIAGNOSIS — I9589 Other hypotension: Secondary | ICD-10-CM | POA: Diagnosis present

## 2017-12-30 DIAGNOSIS — E875 Hyperkalemia: Secondary | ICD-10-CM | POA: Diagnosis present

## 2017-12-30 DIAGNOSIS — I129 Hypertensive chronic kidney disease with stage 1 through stage 4 chronic kidney disease, or unspecified chronic kidney disease: Secondary | ICD-10-CM | POA: Diagnosis present

## 2017-12-30 DIAGNOSIS — E871 Hypo-osmolality and hyponatremia: Secondary | ICD-10-CM | POA: Diagnosis present

## 2017-12-30 DIAGNOSIS — N390 Urinary tract infection, site not specified: Secondary | ICD-10-CM | POA: Diagnosis not present

## 2017-12-30 DIAGNOSIS — I4892 Unspecified atrial flutter: Secondary | ICD-10-CM | POA: Diagnosis present

## 2017-12-30 DIAGNOSIS — Z87442 Personal history of urinary calculi: Secondary | ICD-10-CM | POA: Diagnosis not present

## 2017-12-30 DIAGNOSIS — D696 Thrombocytopenia, unspecified: Secondary | ICD-10-CM | POA: Diagnosis present

## 2017-12-30 DIAGNOSIS — N182 Chronic kidney disease, stage 2 (mild): Secondary | ICD-10-CM | POA: Diagnosis present

## 2017-12-30 DIAGNOSIS — I712 Thoracic aortic aneurysm, without rupture: Secondary | ICD-10-CM | POA: Diagnosis present

## 2017-12-30 DIAGNOSIS — D539 Nutritional anemia, unspecified: Secondary | ICD-10-CM | POA: Diagnosis present

## 2017-12-30 DIAGNOSIS — K5900 Constipation, unspecified: Secondary | ICD-10-CM | POA: Diagnosis present

## 2017-12-30 DIAGNOSIS — Z951 Presence of aortocoronary bypass graft: Secondary | ICD-10-CM | POA: Diagnosis not present

## 2017-12-30 DIAGNOSIS — K219 Gastro-esophageal reflux disease without esophagitis: Secondary | ICD-10-CM | POA: Diagnosis present

## 2017-12-30 DIAGNOSIS — N179 Acute kidney failure, unspecified: Secondary | ICD-10-CM | POA: Diagnosis present

## 2017-12-30 DIAGNOSIS — E785 Hyperlipidemia, unspecified: Secondary | ICD-10-CM | POA: Diagnosis present

## 2017-12-30 DIAGNOSIS — I2510 Atherosclerotic heart disease of native coronary artery without angina pectoris: Secondary | ICD-10-CM | POA: Diagnosis present

## 2017-12-30 DIAGNOSIS — Z955 Presence of coronary angioplasty implant and graft: Secondary | ICD-10-CM | POA: Diagnosis not present

## 2017-12-30 DIAGNOSIS — E860 Dehydration: Secondary | ICD-10-CM | POA: Diagnosis present

## 2017-12-30 DIAGNOSIS — N136 Pyonephrosis: Secondary | ICD-10-CM | POA: Diagnosis present

## 2017-12-30 DIAGNOSIS — Z87891 Personal history of nicotine dependence: Secondary | ICD-10-CM | POA: Diagnosis not present

## 2017-12-30 DIAGNOSIS — N135 Crossing vessel and stricture of ureter without hydronephrosis: Secondary | ICD-10-CM

## 2017-12-30 DIAGNOSIS — J449 Chronic obstructive pulmonary disease, unspecified: Secondary | ICD-10-CM | POA: Diagnosis present

## 2017-12-30 DIAGNOSIS — C3432 Malignant neoplasm of lower lobe, left bronchus or lung: Secondary | ICD-10-CM | POA: Diagnosis present

## 2017-12-30 DIAGNOSIS — Z6821 Body mass index (BMI) 21.0-21.9, adult: Secondary | ICD-10-CM | POA: Diagnosis not present

## 2017-12-30 HISTORY — DX: Crossing vessel and stricture of ureter without hydronephrosis: N13.5

## 2017-12-30 LAB — COMPREHENSIVE METABOLIC PANEL
ALT: 30 U/L (ref 0–44)
AST: 75 U/L — ABNORMAL HIGH (ref 15–41)
Albumin: 3.3 g/dL — ABNORMAL LOW (ref 3.5–5.0)
Alkaline Phosphatase: 53 U/L (ref 38–126)
Anion gap: 8 (ref 5–15)
BUN: 41 mg/dL — ABNORMAL HIGH (ref 8–23)
CO2: 24 mmol/L (ref 22–32)
Calcium: 8.5 mg/dL — ABNORMAL LOW (ref 8.9–10.3)
Chloride: 103 mmol/L (ref 98–111)
Creatinine, Ser: 1.58 mg/dL — ABNORMAL HIGH (ref 0.61–1.24)
GFR calc non Af Amer: 38 mL/min — ABNORMAL LOW (ref >60)
GFR, EST AFRICAN AMERICAN: 44 mL/min — AB (ref >60)
Glucose, Bld: 108 mg/dL — ABNORMAL HIGH (ref 70–99)
Potassium: 5.2 mmol/L — ABNORMAL HIGH (ref 3.5–5.1)
Sodium: 135 mmol/L (ref 135–145)
Total Bilirubin: 1.1 mg/dL (ref 0.3–1.2)
Total Protein: 6 g/dL — ABNORMAL LOW (ref 6.5–8.1)

## 2017-12-30 LAB — SODIUM, URINE, RANDOM: Sodium, Ur: 79 mmol/L

## 2017-12-30 LAB — PREALBUMIN: Prealbumin: 11.5 mg/dL — ABNORMAL LOW (ref 18–38)

## 2017-12-30 LAB — CBC
HCT: 30.1 % — ABNORMAL LOW (ref 39.0–52.0)
Hemoglobin: 9.4 g/dL — ABNORMAL LOW (ref 13.0–17.0)
MCH: 32.1 pg (ref 26.0–34.0)
MCHC: 31.2 g/dL (ref 30.0–36.0)
MCV: 102.7 fL — ABNORMAL HIGH (ref 80.0–100.0)
NRBC: 0 % (ref 0.0–0.2)
Platelets: 183 10*3/uL (ref 150–400)
RBC: 2.93 MIL/uL — AB (ref 4.22–5.81)
RDW: 17.3 % — ABNORMAL HIGH (ref 11.5–15.5)
WBC: 9.6 10*3/uL (ref 4.0–10.5)

## 2017-12-30 LAB — BASIC METABOLIC PANEL
Anion gap: 10 (ref 5–15)
BUN: 41 mg/dL — ABNORMAL HIGH (ref 8–23)
CALCIUM: 8.5 mg/dL — AB (ref 8.9–10.3)
CO2: 24 mmol/L (ref 22–32)
Chloride: 101 mmol/L (ref 98–111)
Creatinine, Ser: 1.66 mg/dL — ABNORMAL HIGH (ref 0.61–1.24)
GFR calc Af Amer: 41 mL/min — ABNORMAL LOW (ref >60)
GFR calc non Af Amer: 35 mL/min — ABNORMAL LOW (ref >60)
Glucose, Bld: 109 mg/dL — ABNORMAL HIGH (ref 70–99)
Potassium: 4.9 mmol/L (ref 3.5–5.1)
SODIUM: 135 mmol/L (ref 135–145)

## 2017-12-30 LAB — MAGNESIUM: Magnesium: 2.7 mg/dL — ABNORMAL HIGH (ref 1.7–2.4)

## 2017-12-30 LAB — TSH: TSH: 0.887 u[IU]/mL (ref 0.350–4.500)

## 2017-12-30 LAB — OSMOLALITY, URINE: OSMOLALITY UR: 539 mosm/kg (ref 300–900)

## 2017-12-30 LAB — CREATININE, URINE, RANDOM: Creatinine, Urine: 73.96 mg/dL

## 2017-12-30 LAB — PHOSPHORUS: Phosphorus: 3.7 mg/dL (ref 2.5–4.6)

## 2017-12-30 MED ORDER — SODIUM CHLORIDE 0.9 % IV SOLN
INTRAVENOUS | Status: AC
  Administered 2017-12-30 – 2017-12-31 (×2): via INTRAVENOUS

## 2017-12-30 MED ORDER — CLOPIDOGREL BISULFATE 75 MG PO TABS
75.0000 mg | ORAL_TABLET | Freq: Every day | ORAL | Status: DC
  Administered 2017-12-30: 75 mg via ORAL
  Filled 2017-12-30: qty 1

## 2017-12-30 MED ORDER — SODIUM CHLORIDE 0.9 % IV BOLUS
500.0000 mL | Freq: Once | INTRAVENOUS | Status: AC
  Administered 2017-12-30: 500 mL via INTRAVENOUS

## 2017-12-30 MED ORDER — POLYETHYLENE GLYCOL 3350 17 G PO PACK
17.0000 g | PACK | Freq: Two times a day (BID) | ORAL | Status: DC
  Administered 2017-12-30 – 2017-12-31 (×3): 17 g via ORAL
  Filled 2017-12-30 (×3): qty 1

## 2017-12-30 MED ORDER — BISACODYL 10 MG RE SUPP
10.0000 mg | Freq: Once | RECTAL | Status: AC
  Administered 2017-12-30: 10 mg via RECTAL
  Filled 2017-12-30: qty 1

## 2017-12-30 MED ORDER — FOLIC ACID 1 MG PO TABS
1.0000 mg | ORAL_TABLET | Freq: Every day | ORAL | Status: DC
  Administered 2017-12-30: 1 mg via ORAL
  Filled 2017-12-30: qty 1

## 2017-12-30 MED ORDER — ASPIRIN EC 81 MG PO TBEC
81.0000 mg | DELAYED_RELEASE_TABLET | Freq: Every day | ORAL | Status: DC
  Administered 2017-12-30: 81 mg via ORAL
  Filled 2017-12-30: qty 1

## 2017-12-30 MED ORDER — EZETIMIBE 10 MG PO TABS
10.0000 mg | ORAL_TABLET | Freq: Every day | ORAL | Status: DC
  Administered 2017-12-30: 10 mg via ORAL
  Filled 2017-12-30: qty 1

## 2017-12-30 MED ORDER — SENNA 8.6 MG PO TABS
2.0000 | ORAL_TABLET | Freq: Two times a day (BID) | ORAL | Status: DC
  Administered 2017-12-30 – 2017-12-31 (×2): 17.2 mg via ORAL
  Filled 2017-12-30 (×2): qty 2

## 2017-12-30 MED ORDER — NITROGLYCERIN 0.4 MG SL SUBL: 0.4000 mg | SUBLINGUAL_TABLET | SUBLINGUAL | Status: DC | PRN

## 2017-12-30 NOTE — Plan of Care (Signed)
Patient without complaint of pain or nausea on 7 a to 7 p shift, tolerating heart healthy diet.  Patient walked in hallways with standby assist (refused assistive device).  Had several BM's after receiving Senokot, Miralax and Dulcolax suppository.  Foley removed early in shift, passing urine, sometimes incontinently.  Family at bedside entire shift.

## 2017-12-30 NOTE — Evaluation (Signed)
Occupational Therapy Evaluation Patient Details Name: Jacob Hampton MRN: [id number] DOB: 1924-04-28 Today's Date: 12/30/2017    History of Present Illness 84 yo male adm for  dehydration, left flank and lower abdominal pain, ongoing hematuria since recent lithotripsy procedure, CT abdomen =left mild hydronephrosis suspected due to stent dysfunction.  Urology consulted. now s/p cystoscopy and left ureteral stent placement 12/29/17 PMH: CAD, COPD, atrial flutter   Clinical Impression   Pt admitted with above diagnoses, with generalized weakness, decreased balance, and decreased activity tolerance impacting ability to engage in BADL at desired level of ind. At baseline pt is very ind, drives, bowls multiple times per week. Pt received suppository moments prior to OT, requiring urgency of BM. Pt quickly moving to toilet before fully safe requiring min A for steadying support. Without urgency, pt is close min guard for safety. Hygiene completed at supervision level of A. Pt using cloth to clean legs with set up A while seated EOB. Mod A needed to don socks, pt needing VC's to cross legs/utilize figure 4 method. Able to initiate socks, but not fully pull up. Session ended with pt using toilet x3, left pt with RN in room. Pt then seen in hallway completing functional mobility at min guard level with RN staff. Pt demonstrated his bowling swing and was hugging staff without evidence of LOB, anticipate LOB from urgency with bowel and bladder. Pt will benefit from acute OT to maximize ind in BADL. Recommend HHOT at d/c to maximize safety and functional mobility in the home.    Follow Up Recommendations  Supervision - Intermittent;Home health OT    Equipment Recommendations  Tub/shower seat    Recommendations for Other Services       Precautions / Restrictions Precautions Precautions: Fall Precaution Comments: urgency with urine/BM Restrictions Weight Bearing Restrictions: No      Mobility Bed  Mobility               General bed mobility comments: recieved EOB working with PT  Transfers Overall transfer level: Needs assistance Equipment used: None Transfers: Sit to/from Stand Sit to Stand: Min guard         General transfer comment: min guard to stand, min A for steadying    Balance Overall balance assessment: Needs assistance Sitting-balance support: Bilateral upper extremity supported;Feet unsupported Sitting balance-Leahy Scale: Good     Standing balance support: No upper extremity supported;During functional activity Standing balance-Leahy Scale: Poor Standing balance comment: unsteady requiring min A                           ADL either performed or assessed with clinical judgement   ADL Overall ADL's : Needs assistance/impaired Eating/Feeding: Set up;Sitting   Grooming: Minimal assistance;Standing Grooming Details (indicate cue type and reason): min A steadying to wash hands at sink Upper Body Bathing: Set up;Sitting   Lower Body Bathing: Set up;Sit to/from stand Lower Body Bathing Details (indicate cue type and reason): to wash BLEs with wash cloth Upper Body Dressing : Set up;Sitting   Lower Body Dressing: Moderate assistance;Sit to/from stand Lower Body Dressing Details (indicate cue type and reason): to don socks; needing VC's to cross legs- once crossed able to initiate but not finish task Toilet Transfer: Loss adjuster, chartered Details (indicate cue type and reason): close min guard for safety, pt with urgency after suppository Toileting- Clothing Manipulation and Hygiene: Supervision/safety;Sit to/from stand   Tub/ Shower Transfer: Minimal assistance;Shower seat  Functional mobility during ADLs: Minimal assistance General ADL Comments: pt currently with balance deficits, needing up to min A for steadying during functional activity. Pt with urgency of BM And urine, moving quickly before fully safe. Pt completing  hygiene at supervision level. Occasionally instances of dizziness with standing, close guard for safety     Vision Baseline Vision/History: No visual deficits       Perception     Praxis      Pertinent Vitals/Pain Pain Assessment: Faces Faces Pain Scale: Hurts little more Pain Location: with BM Pain Descriptors / Indicators: Discomfort Pain Intervention(s): Monitored during session     Hand Dominance     Extremity/Trunk Assessment Upper Extremity Assessment Upper Extremity Assessment: Generalized weakness   Lower Extremity Assessment Lower Extremity Assessment: Generalized weakness       Communication Communication Communication: No difficulties   Cognition Arousal/Alertness: Awake/alert Behavior During Therapy: WFL for tasks assessed/performed Overall Cognitive Status: Within Functional Limits for tasks assessed                                     General Comments       Exercises     Shoulder Instructions      Home Living Family/patient expects to be discharged to:: Private residence Living Arrangements: Children(son) Available Help at Discharge: Available PRN/intermittently;Family                                    Prior Functioning/Environment Level of Independence: Independent        Comments: drives, bowls frequently during the week        OT Problem List: Decreased strength;Decreased activity tolerance;Decreased knowledge of use of DME or AE;Impaired balance (sitting and/or standing);Pain      OT Treatment/Interventions: Self-care/ADL training;DME and/or AE instruction;Therapeutic activities;Balance training;Therapeutic exercise;Patient/family education    OT Goals(Current goals can be found in the care plan section) Acute Rehab OT Goals Patient Stated Goal: to get back to my bowling OT Goal Formulation: With patient Time For Goal Achievement: 01/13/18 Potential to Achieve Goals: Good  OT Frequency: Min  2X/week   Barriers to D/C:            Co-evaluation              AM-PAC OT "6 Clicks" Daily Activity     Outcome Measure Help from another person eating meals?: None Help from another person taking care of personal grooming?: A Little Help from another person toileting, which includes using toliet, bedpan, or urinal?: A Little Help from another person bathing (including washing, rinsing, drying)?: A Little Help from another person to put on and taking off regular upper body clothing?: A Little Help from another person to put on and taking off regular lower body clothing?: A Lot 6 Click Score: 18   End of Session Equipment Utilized During Treatment: Gait belt Nurse Communication: Mobility status  Activity Tolerance: Patient tolerated treatment well Patient left: Other (comment)(on toilet with RN in room, frequency and urgency with BM)  OT Visit Diagnosis: Unsteadiness on feet (R26.81);Other abnormalities of gait and mobility (R26.89);Muscle weakness (generalized) (M62.81)                Time: 9937-1696 OT Time Calculation (min): 16 min Charges:  OT General Charges $OT Visit: 1 Visit OT Evaluation $OT Eval Moderate Complexity: 1 Mod  Zenovia Jarred, MSOT, OTR/L Behavioral Health OT/ Acute Relief OT [facility] Office: [place] 12/30/2017, 3:55 PM

## 2017-12-30 NOTE — Progress Notes (Signed)
OT Cancellation Note  Patient Details Name: Jacob Hampton MRN: [id number] DOB: 07/26/1924   Cancelled Treatment:    Reason Eval/Treat Not Completed: Other (comment) Met with pt who states he just had a suppository and would like to wait some time before engaging in therapy. Pt is looking forward to engaging in therapy, states he needs to get back to bowling. Will continue to follow as schedule permits to initiate OT POC.  Zenovia Jarred, MSOT, OTR/L Behavioral Health OT/ Acute Relief OT [facility] Office: [place] 12/30/2017, 2:26 PM

## 2017-12-30 NOTE — Progress Notes (Signed)
1 Day Post-Op Subjective: Patient reports feeling better--no pain. He has eaten breakfast  Objective: Vital signs in last 24 hours: Temp:  [96.7 F (35.9 C)-98.8 F (37.1 C)] 97.9 F (36.6 C) (12/15 0454) Pulse Rate:  [64-114] 75 (12/15 0454) Resp:  [11-20] 20 (12/15 0454) BP: (93-151)/(57-88) 101/63 (12/15 0454) SpO2:  [96 %-100 %] 99 % (12/15 0454) Weight:  [58.1 kg] 58.1 kg (12/14 1119)  Intake/Output from previous day: 12/14 0701 - 12/15 0700 In: 1528.8 [P.O.:120; I.V.:808.8; IV Piggyback:600] Out: 370 [Urine:370] Intake/Output this shift: No intake/output data recorded.  Physical Exam:  Constitutional: Vital signs reviewed. WD WN in NAD   Eyes: PERRL, No scleral icterus.   Cardiovascular: RRR Pulmonary/Chest: Normal effort     Lab Results: Recent Labs    12/29/17 1226 12/30/17 0606  HGB 11.1* 9.4*  HCT 34.7* 30.1*   BMET Recent Labs    12/30/17 0116 12/30/17 0606  NA 135 135  K 4.9 5.2*  CL 101 103  CO2 24 24  GLUCOSE 109* 108*  BUN 41* 41*  CREATININE 1.66* 1.58*  CALCIUM 8.5* 8.5*   No results for input(s): LABPT, INR in the last 72 hours. No results for input(s): LABURIN in the last 72 hours. Results for orders placed or performed during the hospital encounter of 12/29/17  Urine Culture     Status: None (Preliminary result)   Collection Time: 12/29/17 10:12 PM  Result Value Ref Range Status   Specimen Description CYSTOSCOPY  Final   Special Requests   Final    NONE Performed at [facility] [address], [place], [place] [address]    Culture PENDING  Incomplete   Report Status PENDING  Incomplete    Studies/Results: Dg Abd 1 View  Result Date: 12/30/2017 CLINICAL DATA:  Obstipation. History of cystoscopy and left-sided ureteral stent placement. EXAM: ABDOMEN - 1 VIEW COMPARISON:  CT abdomen pelvis-12/29/2017 FINDINGS: Interval placement of an additional left-sided ureteral stent with superior coil and overlying  expected location the superior aspect the left ureter and inferior coil overlying the urinary bladder. Ill-defined opacities again overlie the superior and mid aspect of the chronic left-sided ureteral stent. Multiple opacities overlie the expected location of the urinary bladder. Vascular calcifications overlie the abdomen and lower pelvis. Ill-defined opacities overlie the expected location of the right renal fossa. Moderate colonic stool burden without evidence of enteric obstruction. No supine evidence of pneumoperitoneum. No pneumatosis or portal venous gas. Post cholecystectomy. No acute osseous abnormalities. IMPRESSION: 1. Moderate colonic stool burden without evidence of enteric obstruction. 2. Interval placement of an additional left-sided ureteral stent with grossly unchanged bilateral renal stone burden, left greater than right, as detailed above. Electronically Signed   By: Sandi Mariscal M.D.   On: 12/30/2017 09:12   Dg C-arm 1-60 Min-no Report  Result Date: 12/29/2017 Fluoroscopy was utilized by the requesting physician.  No radiographic interpretation.   Ct Renal Stone Study  Result Date: 12/29/2017 CLINICAL DATA:  Constipation for several days with decreased oral intake. History of tuberculosis. EXAM: CT ABDOMEN AND PELVIS WITHOUT CONTRAST TECHNIQUE: Multidetector CT imaging of the abdomen and pelvis was performed following the standard protocol without IV contrast. COMPARISON:  Abdominal CT 06/13/2017 and 11/27/2017. Chest CT 11/16/2017 and 05/14/2015. FINDINGS: Lower chest: There is incomplete visualization of a left lower lobe mass seen on the study done last month. This measures up to 3.0 x 2.7 cm on image 1 of series 3. On the most recent examination, this measured  up to 4.2 cm and has enlarged from 2017, highly worrisome for bronchogenic carcinoma. No significant residual left pleural effusion. Status post median sternotomy with diffuse aortic and coronary artery atherosclerosis.  Hepatobiliary: The liver appears unremarkable in the noncontrast state. No focal hepatic abnormality or biliary dilatation post cholecystectomy. Pancreas: Unremarkable. No pancreatic ductal dilatation or surrounding inflammatory changes. Spleen: Normal in size without focal abnormality. Adrenals/Urinary Tract: Both adrenal glands appear normal. There is bilateral nephrolithiasis. In the lower pole of the right kidney, there is a 15 mm calculus on image 29/2. There is no right-sided hydronephrosis or hydroureter. Left ureteral stent remains in place. There are multiple stone fragments along the course of the ureteral stent. Stone burden in the lower pole calyces of the left kidney has decreased. Dilatation of the left renal pelvis and calyces has mildly increased. Previously demonstrated left subcapsular hematoma has slightly improved. There is a stable cyst in the upper pole of the left kidney. The bladder appears unremarkable. Stomach/Bowel: No evidence of bowel wall thickening, distention or surrounding inflammatory change. Large amount of stool throughout the colon, especially in the rectum. Vascular/Lymphatic: There are no enlarged abdominal or pelvic lymph nodes. Extensive aortic and branch vessel atherosclerosis. 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 Reproductive: Diffuse calcifications are again noted throughout the prostate gland which is unchanged in size. Other: No evidence of abdominal wall mass or hernia. No ascites. Musculoskeletal: No acute or significant osseous findings. Stable lumbar spondylosis. Probable decubitus changes over both ischial tuberosities without bone destruction or focal fluid collection. IMPRESSION: 1. Partially imaged left lower lobe mass consistent with bronchogenic carcinoma as seen on recent prior studies. 2. Interval increased dilatation of the left renal collecting system and proximal ureter. The left ureteral stent appears well positioned, although there  are multiple stone fragments along the course of the stent status post apparent interval lithotripsy, and the stent may be dysfunctional. Right renal calculi are unchanged. 3. Left renal subcapsular hematoma has improved compared with the previous study. 4. Prominent stool in the rectum.  No evidence of bowel obstruction. 5.  Aortic Atherosclerosis (ICD10-I70.0). Electronically Signed   By: Richardean Sale M.D.   On: 12/29/2017 13:08    Assessment/Plan:   POD 1 repeat stenting of left prox ureteral steinstrasse following ESL. Native stent still in place. Would recommend holding on repeat ESL scheduled in [place] for tomorrow by Dr Thomasene Mohair  OK to d/c foley  D/C per hospital service     LOS: 0 days   Jorja Loa 12/30/2017, 9:23 AM

## 2017-12-30 NOTE — Evaluation (Signed)
Physical Therapy Evaluation Patient Details Name: Jacob Hampton MRN: [id number] DOB: [birth year] Today's Date: 12/30/2017   History of Present Illness  84 yo male adm for  dehydration, left flank and lower abdominal pain, ongoing hematuria since recent lithotripsy procedure, CT abdomen =left mild hydronephrosis suspected due to stent dysfunction.  Urology consulted. now s/p cystoscopy and left ureteral stent placement 12/29/17 PMH: CAD, COPD, atrial flutter  Clinical Impression  Pt admitted with above diagnosis. Pt currently with functional limitations due to the deficits listed below (see PT Problem List).  Pt with unsteady short distance gait and LOB x2 on initial standing (hx of dizziness with position changes per pt and his dtr); pt denies dizziness today; pt able to amb greater distance (after multiple trips to bathroom) with OT and did well per OT report; will continue to follow in acute setting  Pt will benefit from skilled PT to increase their independence and safety with mobility to allow discharge to the venue listed below.       Follow Up Recommendations Home health PT    Equipment Recommendations  None recommended by PT    Recommendations for Other Services       Precautions / Restrictions Precautions Precautions: Fall Precaution Comments: urgency with urine/BM Restrictions Weight Bearing Restrictions: No      Mobility  Bed Mobility Overal bed mobility: Needs Assistance Bed Mobility: Supine to Sit     Supine to sit: Min guard     General bed mobility comments: for safety  Transfers Overall transfer level: Needs assistance Equipment used: None Transfers: Sit to/from Stand Sit to Stand: Min assist         General transfer comment: min guard to stand, min A for steadying, 2 attempts to stand with posterior LOB and min/mod assist to recover  Ambulation/Gait Ambulation/Gait assistance: Min guard Gait Distance (Feet): 12 Feet Assistive device: None Gait  Pattern/deviations: Step-through pattern;Wide base of support;Decreased stride length     General Gait Details: cues for safety (mildly impulsive d/t urgency), min/guard for balance  Stairs            Wheelchair Mobility    Modified Rankin (Stroke Patients Only)       Balance Overall balance assessment: Needs assistance Sitting-balance support: Bilateral upper extremity supported;Feet unsupported Sitting balance-Leahy Scale: Good     Standing balance support: No upper extremity supported;During functional activity Standing balance-Leahy Scale: Poor Standing balance comment: unsteady requiring min A                             Pertinent Vitals/Pain Pain Assessment: Faces Faces Pain Scale: Hurts a little bit Pain Location: with movement Pain Descriptors / Indicators: Grimacing Pain Intervention(s): Limited activity within patient's tolerance;Monitored during session    Home Living Family/patient expects to be discharged to:: Private residence Living Arrangements: Children Available Help at Discharge: Available PRN/intermittently;Family Type of Home: House Home Access: Stairs to enter Entrance Stairs-Rails: None Entrance Stairs-Number of Steps: 2 Home Layout: One level Home Equipment: Walker - 2 wheels;Cane - single point Additional Comments: does not use devices    Prior Function Level of Independence: Independent         Comments: drives, bowls frequently during the week     Hand Dominance        Extremity/Trunk Assessment   Upper Extremity Assessment Upper Extremity Assessment: Generalized weakness;Defer to OT evaluation    Lower Extremity Assessment Lower Extremity Assessment: Generalized weakness  Communication   Communication: No difficulties  Cognition Arousal/Alertness: Awake/alert Behavior During Therapy: WFL for tasks assessed/performed Overall Cognitive Status: Within Functional Limits for tasks assessed                                         General Comments      Exercises     Assessment/Plan    PT Assessment Patient needs continued PT services  PT Problem List Decreased strength;Decreased activity tolerance;Decreased balance;Decreased knowledge of use of DME;Decreased mobility       PT Treatment Interventions DME instruction;Functional mobility training;Gait training;Therapeutic activities;Balance training;Patient/family education;Therapeutic exercise    PT Goals (Current goals can be found in the Care Plan section)  Acute Rehab PT Goals Patient Stated Goal: to get back to my bowling PT Goal Formulation: With patient Time For Goal Achievement: 01/06/18 Potential to Achieve Goals: Good    Frequency Min 3X/week   Barriers to discharge        Co-evaluation               AM-PAC PT "6 Clicks" Mobility  Outcome Measure Help needed turning from your back to your side while in a flat bed without using bedrails?: A Little Help needed moving from lying on your back to sitting on the side of a flat bed without using bedrails?: A Little Help needed moving to and from a bed to a chair (including a wheelchair)?: A Little Help needed standing up from a chair using your arms (e.g., wheelchair or bedside chair)?: A Little Help needed to walk in hospital room?: A Little Help needed climbing 3-5 steps with a railing? : A Little 6 Click Score: 18    End of Session Equipment Utilized During Treatment: Gait belt Activity Tolerance: Patient tolerated treatment well Patient left: Other (comment)(pt in bathroom with OT present in room)   PT Visit Diagnosis: Unsteadiness on feet (R26.81)    Time: 3382-5053 PT Time Calculation (min) (ACUTE ONLY): 18 min   Charges:   PT Evaluation $PT Eval Low Complexity: 1 Low          Kenyon Ana, PT  Pager: [phone] Acute Rehab Dept [facility]): [phone]   12/30/2017   [facility] 12/30/2017, 4:43 PM

## 2017-12-30 NOTE — Progress Notes (Signed)
Urology Progress Note   1 Day Post-Op  Subjective: NAEON. Pain controlled. Subjectively, feeling much improved this AM.   Objective: Vital signs in last 24 hours: Temp:  [96.7 F (35.9 C)-98.8 F (37.1 C)] 97.9 F (36.6 C) (12/15 0454) Pulse Rate:  [64-114] 75 (12/15 0454) Resp:  [11-20] 20 (12/15 0454) BP: (93-151)/(57-88) 101/63 (12/15 0454) SpO2:  [96 %-100 %] 99 % (12/15 0454) Weight:  [58.1 kg] 58.1 kg (12/14 1119)  Intake/Output from previous day: 12/14 0701 - 12/15 0700 In: 1528.8 [P.O.:120; I.V.:808.8; IV Piggyback:600] Out: 370 [Urine:370] Intake/Output this shift: No intake/output data recorded.  Physical Exam:  General: Alert and oriented CV: HDS Lungs: NWOB Abdomen: Soft, not grossly distended GU: Foley in place draining clear urine Ext: NT, No erythema  Lab Results: Recent Labs    12/29/17 1226 12/30/17 0606  HGB 11.1* 9.4*  HCT 34.7* 30.1*   BMET Recent Labs    12/30/17 0116 12/30/17 0606  NA 135 135  K 4.9 5.2*  CL 101 103  CO2 24 24  GLUCOSE 109* 108*  BUN 41* 41*  CREATININE 1.66* 1.58*  CALCIUM 8.5* 8.5*     Studies/Results: Dg Abd 1 View  Result Date: 12/30/2017 CLINICAL DATA:  Obstipation. History of cystoscopy and left-sided ureteral stent placement. EXAM: ABDOMEN - 1 VIEW COMPARISON:  CT abdomen pelvis-12/29/2017 FINDINGS: Interval placement of an additional left-sided ureteral stent with superior coil and overlying expected location the superior aspect the left ureter and inferior coil overlying the urinary bladder. Ill-defined opacities again overlie the superior and mid aspect of the chronic left-sided ureteral stent. Multiple opacities overlie the expected location of the urinary bladder. Vascular calcifications overlie the abdomen and lower pelvis. Ill-defined opacities overlie the expected location of the right renal fossa. Moderate colonic stool burden without evidence of enteric obstruction. No supine evidence of  pneumoperitoneum. No pneumatosis or portal venous gas. Post cholecystectomy. No acute osseous abnormalities. IMPRESSION: 1. Moderate colonic stool burden without evidence of enteric obstruction. 2. Interval placement of an additional left-sided ureteral stent with grossly unchanged bilateral renal stone burden, left greater than right, as detailed above. Electronically Signed   By: Sandi Mariscal M.D.   On: 12/30/2017 09:12   Dg C-arm 1-60 Min-no Report  Result Date: 12/29/2017 Fluoroscopy was utilized by the requesting physician.  No radiographic interpretation.   Ct Renal Stone Study  Result Date: 12/29/2017 CLINICAL DATA:  Constipation for several days with decreased oral intake. History of tuberculosis. EXAM: CT ABDOMEN AND PELVIS WITHOUT CONTRAST TECHNIQUE: Multidetector CT imaging of the abdomen and pelvis was performed following the standard protocol without IV contrast. COMPARISON:  Abdominal CT 06/13/2017 and 11/27/2017. Chest CT 11/16/2017 and 05/14/2015. FINDINGS: Lower chest: There is incomplete visualization of a left lower lobe mass seen on the study done last month. This measures up to 3.0 x 2.7 cm on image 1 of series 3. On the most recent examination, this measured up to 4.2 cm and has enlarged from 2017, highly worrisome for bronchogenic carcinoma. No significant residual left pleural effusion. Status post median sternotomy with diffuse aortic and coronary artery atherosclerosis. Hepatobiliary: The liver appears unremarkable in the noncontrast state. No focal hepatic abnormality or biliary dilatation post cholecystectomy. Pancreas: Unremarkable. No pancreatic ductal dilatation or surrounding inflammatory changes. Spleen: Normal in size without focal abnormality. Adrenals/Urinary Tract: Both adrenal glands appear normal. There is bilateral nephrolithiasis. In the lower pole of the right kidney, there is a 15 mm calculus on image 29/2. There is no  right-sided hydronephrosis or hydroureter.  Left ureteral stent remains in place. There are multiple stone fragments along the course of the ureteral stent. Stone burden in the lower pole calyces of the left kidney has decreased. Dilatation of the left renal pelvis and calyces has mildly increased. Previously demonstrated left subcapsular hematoma has slightly improved. There is a stable cyst in the upper pole of the left kidney. The bladder appears unremarkable. Stomach/Bowel: No evidence of bowel wall thickening, distention or surrounding inflammatory change. Large amount of stool throughout the colon, especially in the rectum. Vascular/Lymphatic: There are no enlarged abdominal or pelvic lymph nodes. Extensive aortic and branch vessel atherosclerosis. 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 Reproductive: Diffuse calcifications are again noted throughout the prostate gland which is unchanged in size. Other: No evidence of abdominal wall mass or hernia. No ascites. Musculoskeletal: No acute or significant osseous findings. Stable lumbar spondylosis. Probable decubitus changes over both ischial tuberosities without bone destruction or focal fluid collection. IMPRESSION: 1. Partially imaged left lower lobe mass consistent with bronchogenic carcinoma as seen on recent prior studies. 2. Interval increased dilatation of the left renal collecting system and proximal ureter. The left ureteral stent appears well positioned, although there are multiple stone fragments along the course of the stent status post apparent interval lithotripsy, and the stent may be dysfunctional. Right renal calculi are unchanged. 3. Left renal subcapsular hematoma has improved compared with the previous study. 4. Prominent stool in the rectum.  No evidence of bowel obstruction. 5.  Aortic Atherosclerosis (ICD10-I70.0). Electronically Signed   By: Richardean Sale M.D.   On: 12/29/2017 13:08    Assessment/Plan:  84 y.o. male s/p left ureteral stent placement  (alongside previously placed stent) on 12/29/17.  Overall doing well post-op.   - Okay to discontinue foley catheter from a urologic perspective.  - Continue broad spectrum antibiotics pending culture results. Follow-up urine culture and adjust antibiotics appropriately. Plan for a total of 14 day course given complicated UTI.  - Okay for discharge from urologic perspective, but defer to medicine for definitive timing of discharge given constipation, poor po intake and dehydration at time of admission. - Patient to follow-up with Dr. Thomasene Mohair (primary outpatient urologist) for definitive stone and stent management.  - Appreciate medicine's excellent care of this patient.      LOS: 0 days   Dorothey Baseman 12/30/2017, 9:24 AM

## 2017-12-30 NOTE — Progress Notes (Addendum)
PROGRESS NOTE   Jacob Hampton  PXT:[id number]    DOB: [birth year]    DOA: 12/29/2017  PCP: Patient, No Pcp Per   I have briefly reviewed patients previous medical records in [facility].  Brief Narrative:  84 year old male, son lives with him, independent of activities and actually goes bowling couple times a week, does not have a PCP and uses urgent care as needed, PMH of left ureteral calculi, HLD, HTN, CAD status post CABG, atrial flutter now in sinus rhythm (Dr. Wynonia Lawman, primary cardiologist), kidney stones with multiple stent placements, lung mass concerning for malignancy for which she has declined evaluation and treatment, presented to [facility] for 3 days history of constipation, not eating much, concern for dehydration, left flank and lower abdominal pain without urinary complaints, ongoing hematuria since recent lithotripsy procedure, evaluation in ED showed CT abdomen with prominent stool, left mild hydronephrosis suspected due to stent dysfunction.  Urology was consulted and patient is status post cystoscopy and left ureteral stent placement 12/14.  Improving.   Assessment & Plan:   Active Problems:   CAD (coronary artery disease)   Hyperlipidemia   History of upper GI bleeding   COPD GOLD I    Atrial flutter (HCC)   Left ureteral stone   AKI (acute kidney injury) (HCC)   Dehydration   Hyponatremia   Acute lower UTI   Hyperkalemia   Obstipation   Pressure injury of skin   Prolonged QT interval   1. Nephrolithiasis, left ureteral obstruction with hydronephrosis: Urology was consulted and patient underwent cystoscopy and left ureteral stent placement on 12/14.  This was repeat stenting with native stent still in place.  Urology recommends holding on repeat ESL scheduled in [facility] for tomorrow by Dr. Thomasene Mohair.  They recommend outpatient follow-up with Dr. Thomasene Mohair.  Foley catheter discontinued.  Improved. 2. Complicated UTI: Urine culture sent off at  cystoscopy.  Continue IV ceftriaxone pending urine culture results.  Recommend total 14 days antibiotics. 3. Dehydration with hyponatremia: Improved.  Continue additional 24 hours of gentle IV saline hydration. 4. Acute kidney injury on stage II chronic kidney disease: Last known creatinine in November 2019, 1.02.  Presented with creatinine of 2.13.  Acute kidney injury likely related to dehydration, left ureteral obstruction, Bactrim use.  Now obstruction has been addressed as above.  Continue IV fluids.  Creatinine has improved to 1.58.  Follow BMP in a.m. 5. Hyperkalemia: Presented with potassium of 5.4, 5.2 today.  Likely related to acute kidney injury and Bactrim use.  Bactrim discontinued.  Treat acute kidney injury.  Low potassium diet.  Follow BMP in a.m. 6. Macrocytic anemia: Unclear etiology.  Baseline hemoglobin likely in the 10 g range.  Stable.  Follow CBC in a.m.  Outpatient evaluation and management as deemed necessary. 7. CAD status post CABG: No angina reported.  Continue atorvastatin and Zetia.  As discussed with urology, resume aspirin and Plavix. 8. Hyperlipidemia: Continue atorvastatin and Zetia. 9. COPD: Stable without clinical bronchospasm. 10. Paroxysmal atrial flutter: Has undergone cardioversion in the past.  Currently in sinus rhythm.  Not a candidate for anticoagulation due to history of GI bleed and instability while walking and hence fall risk. 11. Constipation: Status post enema at [facility].  KUB 12/15 shows moderate colonic stool burden without evidence of obstruction.  Aggressive bowel regimen. 12. Lung mass: Suspicious for lung cancer.  Patient has declined evaluation and management. 13. 4.6 cm ascending thoracic aortic aneurysm: Reportedly  admitted to [facility] on 11/16/2017 for worsening dyspnea at which time CT PE was negative for PE but noted to have this aneurysm as well as left lower lobe lung mass and large left renal subcapsular  hematoma.  Repeat CT had shown stable hematoma and was followed by urology with observation. 14. Left subcapsular hematoma: Has improved based on CT 12/29/2017. 15. Prolonged QT: EKG 12/30/2017 showed QTC of 487 ms.  Avoid QT prolonging medications.   DVT prophylaxis: SCDs. Code Status: Full Family Communication: Discussed in detail with patient son at bedside, updated care and answered questions. Disposition: DC home pending clinical improvement, hopefully in the next 24-48 hours.  Patient was admitted as observation.  However given persisting acute kidney injury with need for further IV fluids, monitoring and management, complicated UTI awaiting urine culture and need for IV antibiotics, meets criteria for inpatient admission, changed.   Consultants:  Urology  Procedures:  Foley catheter-discontinued 12/15. Cystoscopy and left ureteral stent placement 12/29/2017.  Antimicrobials:  IV ceftriaxone 12/14 >   Subjective: Patient reports feeling much better.  Denies complaints.  No flank or abdominal pain reported.  Tolerated breakfast well.  No fever or chills.  Denies dizziness or lightheadedness or chest pain or palpitations.  As per son at bedside, patient is independent of activities and goes for bowling couple times a week.  ROS: As above.  Objective:  Vitals:   12/30/17 0100 12/30/17 0454 12/30/17 1014 12/30/17 1047  BP: 107/80 101/63 92/61 (!) 86/48  Pulse: 80 75 92   Resp: 20 20 17    Temp: 97.8 F (36.6 C) 97.9 F (36.6 C) 98.2 F (36.8 C)   TempSrc:  Oral Oral   SpO2: 98% 99% 98%   Weight:      Height:        Examination:  General exam: Elderly male, moderately built and frail, lying comfortably propped up in bed without distress.  Oral mucosa with borderline hydration. Respiratory system: Clear to auscultation. Respiratory effort normal. Cardiovascular system: S1 & S2 heard, RRR. No JVD, murmurs, rubs, gallops or clicks. No pedal edema.  Telemetry personally  reviewed: Sinus rhythm. Gastrointestinal system: Abdomen is nondistended, soft and nontender. No organomegaly or masses felt. Normal bowel sounds heard. Central nervous system: Alert and oriented. No focal neurological deficits. Extremities: Symmetric 5 x 5 power. Skin: No rashes, lesions or ulcers Psychiatry: Judgement and insight appear normal. Mood & affect appropriate.     Data Reviewed: I have personally reviewed following labs and imaging studies  CBC: Recent Labs  Lab 12/29/17 1226 12/30/17 0606  WBC 13.1* 9.6  NEUTROABS 11.5*  --   HGB 11.1* 9.4*  HCT 34.7* 30.1*  MCV 99.7 102.7*  PLT 254 711   Basic Metabolic Panel: Recent Labs  Lab 12/29/17 1226 12/30/17 0116 12/30/17 0606  NA 131* 135 135  K 5.4* 4.9 5.2*  CL 98 101 103  CO2 23 24 24   GLUCOSE 118* 109* 108*  BUN 44* 41* 41*  CREATININE 2.13* 1.66* 1.58*  CALCIUM 9.4 8.5* 8.5*  MG  --   --  2.7*  PHOS  --   --  3.7   Liver Function Tests: Recent Labs  Lab 12/29/17 1226 12/30/17 0606  AST 90* 75*  ALT 35 30  ALKPHOS 70 53  BILITOT 1.1 1.1  PROT 7.8 6.0*  ALBUMIN 4.2 3.3*     Recent Results (from the past 240 hour(s))  Urine Culture     Status: None (Preliminary  result)   Collection Time: 12/29/17 10:12 PM  Result Value Ref Range Status   Specimen Description CYSTOSCOPY  Final   Special Requests   Final    NONE Performed at [facility] [address], [place], [place] [address]    Culture PENDING  Incomplete   Report Status PENDING  Incomplete         Radiology Studies: Dg Abd 1 View  Result Date: 12/30/2017 CLINICAL DATA:  Obstipation. History of cystoscopy and left-sided ureteral stent placement. EXAM: ABDOMEN - 1 VIEW COMPARISON:  CT abdomen pelvis-12/29/2017 FINDINGS: Interval placement of an additional left-sided ureteral stent with superior coil and overlying expected location the superior aspect the left ureter and inferior coil overlying the urinary  bladder. Ill-defined opacities again overlie the superior and mid aspect of the chronic left-sided ureteral stent. Multiple opacities overlie the expected location of the urinary bladder. Vascular calcifications overlie the abdomen and lower pelvis. Ill-defined opacities overlie the expected location of the right renal fossa. Moderate colonic stool burden without evidence of enteric obstruction. No supine evidence of pneumoperitoneum. No pneumatosis or portal venous gas. Post cholecystectomy. No acute osseous abnormalities. IMPRESSION: 1. Moderate colonic stool burden without evidence of enteric obstruction. 2. Interval placement of an additional left-sided ureteral stent with grossly unchanged bilateral renal stone burden, left greater than right, as detailed above. Electronically Signed   By: Sandi Mariscal M.D.   On: 12/30/2017 09:12   Dg C-arm 1-60 Min-no Report  Result Date: 12/29/2017 Fluoroscopy was utilized by the requesting physician.  No radiographic interpretation.   Ct Renal Stone Study  Result Date: 12/29/2017 CLINICAL DATA:  Constipation for several days with decreased oral intake. History of tuberculosis. EXAM: CT ABDOMEN AND PELVIS WITHOUT CONTRAST TECHNIQUE: Multidetector CT imaging of the abdomen and pelvis was performed following the standard protocol without IV contrast. COMPARISON:  Abdominal CT 06/13/2017 and 11/27/2017. Chest CT 11/16/2017 and 05/14/2015. FINDINGS: Lower chest: There is incomplete visualization of a left lower lobe mass seen on the study done last month. This measures up to 3.0 x 2.7 cm on image 1 of series 3. On the most recent examination, this measured up to 4.2 cm and has enlarged from 2017, highly worrisome for bronchogenic carcinoma. No significant residual left pleural effusion. Status post median sternotomy with diffuse aortic and coronary artery atherosclerosis. Hepatobiliary: The liver appears unremarkable in the noncontrast state. No focal hepatic abnormality  or biliary dilatation post cholecystectomy. Pancreas: Unremarkable. No pancreatic ductal dilatation or surrounding inflammatory changes. Spleen: Normal in size without focal abnormality. Adrenals/Urinary Tract: Both adrenal glands appear normal. There is bilateral nephrolithiasis. In the lower pole of the right kidney, there is a 15 mm calculus on image 29/2. There is no right-sided hydronephrosis or hydroureter. Left ureteral stent remains in place. There are multiple stone fragments along the course of the ureteral stent. Stone burden in the lower pole calyces of the left kidney has decreased. Dilatation of the left renal pelvis and calyces has mildly increased. Previously demonstrated left subcapsular hematoma has slightly improved. There is a stable cyst in the upper pole of the left kidney. The bladder appears unremarkable. Stomach/Bowel: No evidence of bowel wall thickening, distention or surrounding inflammatory change. Large amount of stool throughout the colon, especially in the rectum. Vascular/Lymphatic: There are no enlarged abdominal or pelvic lymph nodes. Extensive aortic and branch vessel atherosclerosis. 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 Reproductive: Diffuse calcifications are again noted throughout the prostate gland which is  unchanged in size. Other: No evidence of abdominal wall mass or hernia. No ascites. Musculoskeletal: No acute or significant osseous findings. Stable lumbar spondylosis. Probable decubitus changes over both ischial tuberosities without bone destruction or focal fluid collection. IMPRESSION: 1. Partially imaged left lower lobe mass consistent with bronchogenic carcinoma as seen on recent prior studies. 2. Interval increased dilatation of the left renal collecting system and proximal ureter. The left ureteral stent appears well positioned, although there are multiple stone fragments along the course of the stent status post apparent interval lithotripsy,  and the stent may be dysfunctional. Right renal calculi are unchanged. 3. Left renal subcapsular hematoma has improved compared with the previous study. 4. Prominent stool in the rectum.  No evidence of bowel obstruction. 5.  Aortic Atherosclerosis (ICD10-I70.0). Electronically Signed   By: Richardean Sale M.D.   On: 12/29/2017 13:08        Scheduled Meds: . atorvastatin  80 mg Oral q1800  . senna  1 tablet Oral BID   Continuous Infusions: . sodium chloride 250 mL (12/29/17 1618)  . cefTRIAXone (ROCEPHIN)  IV       LOS: 0 days     Vernell Leep, MD, FACP, [facility]. [facility] Pager [phone]  If 7PM-7AM, please contact night-coverage [email or web address] Password [email or web address] 12/30/2017, 12:03 PM

## 2017-12-31 DIAGNOSIS — E440 Moderate protein-calorie malnutrition: Secondary | ICD-10-CM

## 2017-12-31 HISTORY — DX: Moderate protein-calorie malnutrition: E44.0

## 2017-12-31 LAB — CBC
HCT: 27.3 % — ABNORMAL LOW (ref 39.0–52.0)
Hemoglobin: 8.5 g/dL — ABNORMAL LOW (ref 13.0–17.0)
MCH: 32.7 pg (ref 26.0–34.0)
MCHC: 31.1 g/dL (ref 30.0–36.0)
MCV: 105 fL — ABNORMAL HIGH (ref 80.0–100.0)
NRBC: 0 % (ref 0.0–0.2)
Platelets: 132 10*3/uL — ABNORMAL LOW (ref 150–400)
RBC: 2.6 MIL/uL — ABNORMAL LOW (ref 4.22–5.81)
RDW: 17.4 % — ABNORMAL HIGH (ref 11.5–15.5)
WBC: 8 10*3/uL (ref 4.0–10.5)

## 2017-12-31 LAB — BASIC METABOLIC PANEL
Anion gap: 7 (ref 5–15)
BUN: 31 mg/dL — ABNORMAL HIGH (ref 8–23)
CO2: 23 mmol/L (ref 22–32)
Calcium: 8.1 mg/dL — ABNORMAL LOW (ref 8.9–10.3)
Chloride: 106 mmol/L (ref 98–111)
Creatinine, Ser: 1.19 mg/dL (ref 0.61–1.24)
GFR calc Af Amer: >60 mL/min (ref >60)
GFR calc non Af Amer: 53 mL/min — ABNORMAL LOW (ref >60)
Glucose, Bld: 87 mg/dL (ref 70–99)
Potassium: 4.2 mmol/L (ref 3.5–5.1)
Sodium: 136 mmol/L (ref 135–145)

## 2017-12-31 LAB — URINE CULTURE

## 2017-12-31 MED ORDER — ADULT MULTIVITAMIN W/MINERALS CH
1.0000 | ORAL_TABLET | Freq: Every day | ORAL | Status: DC
  Administered 2017-12-31: 1 via ORAL
  Filled 2017-12-31: qty 1

## 2017-12-31 MED ORDER — ENSURE ENLIVE PO LIQD: 237.0000 mL | Freq: Two times a day (BID) | ORAL | 12 refills | Status: AC

## 2017-12-31 MED ORDER — ENSURE ENLIVE PO LIQD
237.0000 mL | Freq: Two times a day (BID) | ORAL | Status: DC
  Administered 2017-12-31: 237 mL via ORAL

## 2017-12-31 MED ORDER — ADULT MULTIVITAMIN W/MINERALS CH: 1.0000 | ORAL_TABLET | Freq: Every day | ORAL | Status: DC

## 2017-12-31 MED ORDER — CEPHALEXIN 500 MG PO CAPS: 500.0000 mg | ORAL_CAPSULE | Freq: Two times a day (BID) | ORAL | 0 refills | Status: AC

## 2017-12-31 MED ORDER — POLYETHYLENE GLYCOL 3350 17 G PO PACK: 17.0000 g | PACK | Freq: Two times a day (BID) | ORAL | 0 refills | Status: DC

## 2017-12-31 MED ORDER — SENNA 8.6 MG PO TABS: 2.0000 | ORAL_TABLET | Freq: Two times a day (BID) | ORAL | 0 refills | Status: DC

## 2017-12-31 NOTE — Progress Notes (Signed)
2 Days Post-Op  Subjective: Mr. Jafri is doing well s/p left ureteral stent insertion for an obstructed left ureteral stent with encrustation and residual stone following left ESWL.   He has no pain and is voiding well.  ROS:  Review of Systems  All other systems reviewed and are negative.   Anti-infectives: Anti-infectives (From admission, onward)   Start     Dose/Rate Route Frequency Ordered Stop   12/30/17 1600  cefTRIAXone (ROCEPHIN) 1 g in sodium chloride 0.9 % 100 mL IVPB     1 g 200 mL/hr over 30 Minutes Intravenous Every 24 hours 12/29/17 1935     12/30/17 0600  cefTRIAXone (ROCEPHIN) 1 g in sodium chloride 0.9 % 100 mL IVPB    Note to Pharmacy:  IN addition to 1g received in ED.   1 g 200 mL/hr over 30 Minutes Intravenous On call to O.R. 12/29/17 2033 12/30/17 2116   12/29/17 2124  sodium chloride 0.9 % with cefTRIAXone (ROCEPHIN) ADS Med    Note to Pharmacy:  Herbie Drape   : cabinet override      12/29/17 2124 12/29/17 2156   12/29/17 1600  cefTRIAXone (ROCEPHIN) 1 g in sodium chloride 0.9 % 100 mL IVPB     1 g 200 mL/hr over 30 Minutes Intravenous  Once 12/29/17 1555 12/29/17 1650      Current Facility-Administered Medications  Medication Dose Route Frequency Provider Last Rate Last Dose  . 0.9 %  sodium chloride infusion   Intravenous PRN Malvin Johns, MD 10 mL/hr at 12/29/17 1618 250 mL at 12/29/17 1618  . 0.9 %  sodium chloride infusion   Intravenous Continuous Modena Jansky, MD 100 mL/hr at 12/31/17 0600    . acetaminophen (TYLENOL) tablet 650 mg  650 mg Oral Q6H PRN Toy Baker, MD       Or  . acetaminophen (TYLENOL) suppository 650 mg  650 mg Rectal Q6H PRN Doutova, Anastassia, MD      . aspirin EC tablet 81 mg  81 mg Oral QHS Modena Jansky, MD   81 mg at 12/30/17 2114  . atorvastatin (LIPITOR) tablet 80 mg  80 mg Oral q1800 Toy Baker, MD   80 mg at 12/30/17 1807  . cefTRIAXone (ROCEPHIN) 1 g in sodium chloride 0.9 % 100 mL IVPB  1  g Intravenous Q24H Doutova, Anastassia, MD 200 mL/hr at 12/30/17 1609 1 g at 12/30/17 1609  . clopidogrel (PLAVIX) tablet 75 mg  75 mg Oral QHS Modena Jansky, MD   75 mg at 12/30/17 2114  . ezetimibe (ZETIA) tablet 10 mg  10 mg Oral QHS Modena Jansky, MD   10 mg at 12/30/17 2114  . folic acid (FOLVITE) tablet 1 mg  1 mg Oral QHS Hongalgi, Lenis Dickinson, MD   1 mg at 12/30/17 2114  . HYDROcodone-acetaminophen (NORCO/VICODIN) 5-325 MG per tablet 1-2 tablet  1-2 tablet Oral Q4H PRN Doutova, Anastassia, MD      . nitroGLYCERIN (NITROSTAT) SL tablet 0.4 mg  0.4 mg Sublingual Q5 min PRN Hongalgi, Anand D, MD      . polyethylene glycol (MIRALAX / GLYCOLAX) packet 17 g  17 g Oral BID Modena Jansky, MD   17 g at 12/30/17 2114  . senna (SENOKOT) tablet 17.2 mg  2 tablet Oral BID Vernell Leep D, MD   17.2 mg at 12/30/17 2114     Objective: Vital signs in last 24 hours: Temp:  [98.2 F (36.8 C)-98.6 F (37  C)] 98.2 F (36.8 C) (12/16 0457) Pulse Rate:  [69-96] 69 (12/16 0457) Resp:  [16-18] 18 (12/16 0457) BP: (84-102)/(44-61) 90/44 (12/16 0500) SpO2:  [96 %-100 %] 96 % (12/16 0457)  Intake/Output from previous day: 12/15 0701 - 12/16 0700 In: 2968.7 [P.O.:720; I.V.:2148.7; IV Piggyback:100] Out: 350 [Urine:350] Intake/Output this shift: No intake/output data recorded.   Physical Exam Vitals signs reviewed.  Constitutional:      Appearance: Normal appearance.  Abdominal:     General: Abdomen is flat.     Palpations: Abdomen is soft.     Tenderness: There is no abdominal tenderness.  Neurological:     Mental Status: He is alert.     Lab Results:  Recent Labs    12/30/17 0606 12/31/17 0529  WBC 9.6 8.0  HGB 9.4* 8.5*  HCT 30.1* 27.3*  PLT 183 132*   BMET Recent Labs    12/30/17 0606 12/31/17 0529  NA 135 136  K 5.2* 4.2  CL 103 106  CO2 24 23  GLUCOSE 108* 87  BUN 41* 31*  CREATININE 1.58* 1.19  CALCIUM 8.5* 8.1*   PT/INR No results for input(s):  LABPROT, INR in the last 72 hours. ABG No results for input(s): PHART, HCO3 in the last 72 hours.  Invalid input(s): PCO2, PO2  Studies/Results: Dg Abd 1 View  Result Date: 12/30/2017 CLINICAL DATA:  Obstipation. History of cystoscopy and left-sided ureteral stent placement. EXAM: ABDOMEN - 1 VIEW COMPARISON:  CT abdomen pelvis-12/29/2017 FINDINGS: Interval placement of an additional left-sided ureteral stent with superior coil and overlying expected location the superior aspect the left ureter and inferior coil overlying the urinary bladder. Ill-defined opacities again overlie the superior and mid aspect of the chronic left-sided ureteral stent. Multiple opacities overlie the expected location of the urinary bladder. Vascular calcifications overlie the abdomen and lower pelvis. Ill-defined opacities overlie the expected location of the right renal fossa. Moderate colonic stool burden without evidence of enteric obstruction. No supine evidence of pneumoperitoneum. No pneumatosis or portal venous gas. Post cholecystectomy. No acute osseous abnormalities. IMPRESSION: 1. Moderate colonic stool burden without evidence of enteric obstruction. 2. Interval placement of an additional left-sided ureteral stent with grossly unchanged bilateral renal stone burden, left greater than right, as detailed above. Electronically Signed   By: Sandi Mariscal M.D.   On: 12/30/2017 09:12   Dg C-arm 1-60 Min-no Report  Result Date: 12/29/2017 Fluoroscopy was utilized by the requesting physician.  No radiographic interpretation.   Ct Renal Stone Study  Result Date: 12/29/2017 CLINICAL DATA:  Constipation for several days with decreased oral intake. History of tuberculosis. EXAM: CT ABDOMEN AND PELVIS WITHOUT CONTRAST TECHNIQUE: Multidetector CT imaging of the abdomen and pelvis was performed following the standard protocol without IV contrast. COMPARISON:  Abdominal CT 06/13/2017 and 11/27/2017. Chest CT 11/16/2017 and  05/14/2015. FINDINGS: Lower chest: There is incomplete visualization of a left lower lobe mass seen on the study done last month. This measures up to 3.0 x 2.7 cm on image 1 of series 3. On the most recent examination, this measured up to 4.2 cm and has enlarged from 2017, highly worrisome for bronchogenic carcinoma. No significant residual left pleural effusion. Status post median sternotomy with diffuse aortic and coronary artery atherosclerosis. Hepatobiliary: The liver appears unremarkable in the noncontrast state. No focal hepatic abnormality or biliary dilatation post cholecystectomy. Pancreas: Unremarkable. No pancreatic ductal dilatation or surrounding inflammatory changes. Spleen: Normal in size without focal abnormality. Adrenals/Urinary Tract: Both adrenal glands appear  normal. There is bilateral nephrolithiasis. In the lower pole of the right kidney, there is a 15 mm calculus on image 29/2. There is no right-sided hydronephrosis or hydroureter. Left ureteral stent remains in place. There are multiple stone fragments along the course of the ureteral stent. Stone burden in the lower pole calyces of the left kidney has decreased. Dilatation of the left renal pelvis and calyces has mildly increased. Previously demonstrated left subcapsular hematoma has slightly improved. There is a stable cyst in the upper pole of the left kidney. The bladder appears unremarkable. Stomach/Bowel: No evidence of bowel wall thickening, distention or surrounding inflammatory change. Large amount of stool throughout the colon, especially in the rectum. Vascular/Lymphatic: There are no enlarged abdominal or pelvic lymph nodes. Extensive aortic and branch vessel atherosclerosis. 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 Reproductive: Diffuse calcifications are again noted throughout the prostate gland which is unchanged in size. Other: No evidence of abdominal wall mass or hernia. No ascites. Musculoskeletal: No  acute or significant osseous findings. Stable lumbar spondylosis. Probable decubitus changes over both ischial tuberosities without bone destruction or focal fluid collection. IMPRESSION: 1. Partially imaged left lower lobe mass consistent with bronchogenic carcinoma as seen on recent prior studies. 2. Interval increased dilatation of the left renal collecting system and proximal ureter. The left ureteral stent appears well positioned, although there are multiple stone fragments along the course of the stent status post apparent interval lithotripsy, and the stent may be dysfunctional. Right renal calculi are unchanged. 3. Left renal subcapsular hematoma has improved compared with the previous study. 4. Prominent stool in the rectum.  No evidence of bowel obstruction. 5.  Aortic Atherosclerosis (ICD10-I70.0). Electronically Signed   By: Richardean Sale M.D.   On: 12/29/2017 13:08     Assessment and Plan: He is doing well s/p placement on a left ureteral stent along his encrusted left stent.   I will contact Dr. Willaim Bane office today to make them aware of his need for f/u.       LOS: 1 day    Irine Seal 12/31/2017 [phone] ID: [name], male   DOB: [birth year], 84 y.o.   MRN: [id number]

## 2017-12-31 NOTE — Progress Notes (Signed)
Nutrition Follow-up  DOCUMENTATION CODES:   Non-severe (moderate) malnutrition in context of chronic illness  INTERVENTION:  - Will order Ensure Enlive BID, each supplement provides 350 kcal and 20 grams of protein. - Will order daily multivitamin with minerals. - Continue to encourage PO intakes.    NUTRITION DIAGNOSIS:   Moderate Malnutrition related to chronic illness(CAD, multiple episodes of kidney stones) as evidenced by moderate fat depletion, moderate muscle depletion.  GOAL:   Patient will meet greater than or equal to 90% of their needs  MONITOR:   PO intake, Supplement acceptance, Weight trends, Labs, Skin  REASON FOR ASSESSMENT:   Malnutrition Screening Tool, Consult Assessment of nutrition requirement/status  ASSESSMENT:   84 year old male with PMH of L ureteral calculi, HLD, HTN, CAD s/p CABG, atrial flutter now in sinus rhythm, kidney stones with multiple stent placements, lung mass concerning for malignancy for which he has declined evaluation and treatment. He presented to [facility] ED for 3 days history of constipation, not eating much, concern for dehydration, left flank and lower abdominal, and ongoing hematuria since recent lithotripsy procedure. Evaluation in ED showed CT abdomen with prominent stool, left mild hydronephrosis suspected due to stent dysfunction.  Urology was consulted and patient is s/p cystoscopy and L ureteral stent placement 12/14.   BMI indicates normal weight. Patient sitting in chair with his 3 children in the room. Family reports that they were informed that patient is likely going home in ~1-2 hours. Patient states he is feeling good and denies any abdominal pain or nausea, denies any lower abdominal pressure when eating. Patient and family report that he ate well for breakfast. One son recently ordered patient lunch: [place], whipped potatoes, green beans, dinner roll, ice cream, and sprite. Patient is feeling hungry.    Patient reports that he has had a good appetite since admission. Flow sheet indicates 75% completion of breakfast 12/15; no other intakes documented. Patient states appetite was good until the few days PTA. He states that he has had multiple episodes of kidney stones which cause pain and associated loss of appetite with poor PO intakes.   Per chart review, current weight is 128 lb and weight has been stable over the past 3 months. On 8/28 patient weighed 140 lb. This indicates 12 lb weight loss (8.5% body weight) in the past 4 months.    Medications reviewed; 1 mg Folvite/day, 1 packet Miralax BID, 2 tablets Senokot BID. Labs reviewed; BUN: 31 mg/dL, Ca: 8.1 mg/dL, GFR: 53 mL/min.       NUTRITION - FOCUSED PHYSICAL EXAM:    Most Recent Value  Orbital Region  Mild depletion  Upper Arm Region  Moderate depletion  Thoracic and Lumbar Region  Unable to assess  Buccal Region  Moderate depletion  Temple Region  Mild depletion  Clavicle Bone Region  Severe depletion  Clavicle and Acromion Bone Region  Moderate depletion  Scapular Bone Region  Unable to assess  Dorsal Hand  Moderate depletion  Patellar Region  Unable to assess  Anterior Thigh Region  Unable to assess  Posterior Calf Region  Unable to assess  Edema (RD Assessment)  Unable to assess  Hair  Reviewed  Eyes  Reviewed  Mouth  Reviewed  Skin  Reviewed  Nails  Reviewed       Diet Order:   Diet Order            Diet Heart Room service appropriate? Yes; Fluid consistency: Thin  Diet effective now  EDUCATION NEEDS:   No education needs have been identified at this time  Skin:  Skin Assessment: Skin Integrity Issues: Skin Integrity Issues:: Stage II, Incisions Stage II: sacrum Incisions: penis 12/14  Last BM:  12/15  Height:   Ht Readings from Last 1 Encounters:  12/29/17 5\' 5"  (1.651 m)    Weight:   Wt Readings from Last 1 Encounters:  12/29/17 58.1 kg    Ideal Body Weight:  61.82  kg  BMI:  Body mass index is 21.3 kg/m.  Estimated Nutritional Needs:   Kcal:  1450-1630 kcal  Protein:  60-70 grams  Fluid:  >/= 1.7 L/day     Jarome Matin, MS, RD, LDN, [facility] Inpatient Clinical Dietitian Pager # [phone] After hours/weekend pager # [phone]

## 2017-12-31 NOTE — Care Management Note (Addendum)
Case Management Note  Patient Details  Name: Dmitry Macomber MRN: [id number] Date of Birth: 11/21/1924  Subjective/Objective:  Spoke with patient and son at bedside. Discussed recommendations for HHPT, patient declines. Son in agreement, states he is very independent and does not feel HH will add anything. Plans to f/u with urologist for lab work.                    Action/Plan: No needs.   Expected Discharge Date:                  Expected Discharge Plan:  Home/Self Care  In-House Referral:  NA  Discharge planning Services  CM Consult  Post Acute Care Choice:  NA Choice offered to:  Patient  DME Arranged:  N/A DME Agency:  NA  HH Arranged:  NA HH Agency:  NA  Status of Service:  Completed, signed off  If discussed at [place] of Stay Meetings, dates discussed:    Additional Comments:  Guadalupe Maple, RN 12/31/2017, 10:48 AM

## 2017-12-31 NOTE — Discharge Instructions (Signed)

## 2017-12-31 NOTE — Discharge Summary (Addendum)
Physician Discharge Summary  Jacob Hampton YCX:[id number] DOB: 1924-07-28  PCP: Patient, No Pcp Per  Admit date: 12/29/2017 Discharge date: 12/31/2017  Recommendations for Outpatient Follow-up:  1. Dr. Fenton Malling, Urology in 3 to 5 days with repeat labs (CBC & CMP).  Please follow final urine culture results (taken at cystoscopy) that were sent from the hospital. 2. Family Physician at an Urgent Care in [facility] (patient usually goes there).  He may follow-up there if he is unable to get lab work done with his Dealer.  Patient and son aware.  Home Health: Home health PT recommended but patient declines. Equipment/Devices: None.  Discharge Condition: Improved and stable. CODE STATUS: Full Diet recommendation: Heart healthy diet.  Discharge Diagnoses:  Active Problems:   CAD (coronary artery disease)   Hyperlipidemia   History of upper GI bleeding   COPD GOLD I    Atrial flutter (HCC)   Left ureteral stone   AKI (acute kidney injury) (HCC)   Dehydration   Hyponatremia   Acute lower UTI   Hyperkalemia   Obstipation   Pressure injury of skin   Prolonged QT interval   Ureteral obstruction, left   Malnutrition of moderate degree   Brief Summary: 84 year old male, son lives with him, independent of activities and actually goes bowling couple times a week, does not have a PCP and uses urgent care as needed, PMH of left ureteral calculi, HLD, HTN, CAD status post CABG, atrial flutter now in sinus rhythm (Dr. Wynonia Lawman, primary cardiologist), kidney stones with multiple stent placements, lung mass concerning for malignancy for which she has declined evaluation and treatment, presented to [place] ED for 3 days history of constipation, not eating much, concern for dehydration, left flank and lower abdominal pain without urinary complaints, ongoing hematuria since recent lithotripsy procedure, evaluation in ED showed CT abdomen with prominent stool, left mild hydronephrosis  suspected due to stent dysfunction.  Urology was consulted and patient is status post cystoscopy and left ureteral stent placement 12/14.     Assessment & Plan:  1. Nephrolithiasis, left ureteral obstruction with hydronephrosis: Urology was consulted and patient underwent cystoscopy and left ureteral stent placement on 12/14.  This was repeat stenting with native stent still in place.  Urology recommends holding on repeat ESL scheduled in [place] for 12/16 by Dr. Thomasene Mohair.  They recommend outpatient follow-up with Dr. Thomasene Mohair.  Foley catheter discontinued.  Voiding without difficulty.  As per Urology follow-up, they will contact Dr. Willaim Bane office today to make them aware of his need for follow-up. 2. Complicated UTI: Urine culture sent off at cystoscopy.    Patient completed 2 days of IV ceftriaxone.  First urine culture showed insignificant growth.  Urine culture from cystoscopy is pending at time of discharge and can be followed up as outpatient.    Transitioned to oral Keflex to complete total 14 days antibiotics. 3. Dehydration with hyponatremia: Resolved after IV fluids. 4. Acute kidney injury on stage II chronic kidney disease: Last known creatinine in November 2019, 1.02.  Presented with creatinine of 2.13.  Acute kidney injury likely related to dehydration, left ureteral obstruction, Bactrim use.  Now obstruction has been addressed as above.  Creatinine has now normalized.  Periodically follow BMP as outpatient. 5. Hyperkalemia: Presented with potassium of 5.4, 5.2 yesterday.  Likely related to acute kidney injury and Bactrim use.  Bactrim discontinued.  Treat acute kidney injury.  Low potassium diet.    Resolved. 6. Macrocytic anemia: Unclear etiology.  Baseline hemoglobin likely in the 10 g range.    Hemoglobin has dropped from 9.4-8.5 in the absence of overt bleeding.  Some of this may be dilutional from IV fluids.  Follow CBC closely later this week as outpatient.  Outpatient  evaluation and management as deemed necessary. 7. CAD status post CABG: No angina reported.  Continue atorvastatin and Zetia.  As discussed with urology, resumed aspirin and Plavix. 8. Hyperlipidemia: Continue atorvastatin and Zetia. 9. COPD: Stable without clinical bronchospasm. 10. Paroxysmal atrial flutter: Has undergone cardioversion in the past.  Currently in sinus rhythm.  Not a candidate for anticoagulation due to history of GI bleed and instability while walking and hence fall risk. 11. Constipation: Status post enema at [facility] ED.  KUB 12/15 shows moderate colonic stool burden without evidence of obstruction.  Aggressive bowel regimen.  Having BMs now. 12. Lung mass: Suspicious for lung cancer.  Patient has declined evaluation and management. 13. 4.6 cm ascending thoracic aortic aneurysm: Reportedly admitted to [facility] on 11/16/2017 for worsening dyspnea at which time CT PE was negative for PE but noted to have this aneurysm as well as left lower lobe lung mass and large left renal subcapsular hematoma.  Repeat CT had shown stable hematoma and was followed by urology with observation. 14. Left subcapsular hematoma: Has improved based on CT 12/29/2017. 15. Prolonged QT: EKG 12/30/2017 showed QTC of 487 ms.  Avoid QT prolonging medications.  16. Chronic hypotension: Mostly asymptomatic.  No history of syncope or near syncope.  Patient remains physically quite active, drives, goes to do bowling twice a week. 17. Thrombocytopenia: Unclear etiology.  Could have been due to acute infection versus antibiotics.  No bleeding.  Follow CBC later this week.     Consultants:  Urology  Procedures:  Foley catheter-discontinued 12/15. Cystoscopy and left ureteral stent placement 12/29/2017.    Discharge Instructions  Discharge Instructions    Call MD for:  difficulty breathing, headache or visual disturbances   Complete by:  As directed    Call MD for:   extreme fatigue   Complete by:  As directed    Call MD for:  persistant dizziness or light-headedness   Complete by:  As directed    Call MD for:  persistant nausea and vomiting   Complete by:  As directed    Call MD for:  severe uncontrolled pain   Complete by:  As directed    Call MD for:  temperature >100.4   Complete by:  As directed    Diet - low sodium heart healthy   Complete by:  As directed    Increase activity slowly   Complete by:  As directed        Medication List    STOP taking these medications   sulfamethoxazole-trimethoprim 800-160 MG tablet Commonly known as:  BACTRIM DS,SEPTRA DS     TAKE these medications   aspirin EC 81 MG tablet Take 81 mg by mouth at bedtime.   atorvastatin 80 MG tablet Commonly known as:  LIPITOR Take 80 mg by mouth at bedtime.   cephALEXin 500 MG capsule Commonly known as:  KEFLEX Take 1 capsule (500 mg total) by mouth 2 (two) times daily for 12 days.   clopidogrel 75 MG tablet Commonly known as:  PLAVIX Take 75 mg by mouth at bedtime.   ezetimibe 10 MG tablet Commonly known as:  ZETIA Take 10 mg by mouth at bedtime.   feeding supplement (ENSURE  ENLIVE) Liqd Take 237 mLs by mouth 2 (two) times daily between meals. Start taking on:  January 02, 7407   folic acid 1 MG tablet Commonly known as:  FOLVITE Take 1 mg by mouth at bedtime.   multivitamin with minerals Tabs tablet Take 1 tablet by mouth daily. Start taking on:  January 01, 2018   nitroGLYCERIN 0.4 MG SL tablet Commonly known as:  NITROSTAT Place 0.4 mg under the tongue every 5 (five) minutes as needed. For chest pain   polyethylene glycol packet Commonly known as:  MIRALAX / GLYCOLAX Take 17 g by mouth 2 (two) times daily.   senna 8.6 MG Tabs tablet Commonly known as:  SENOKOT Take 2 tablets (17.2 mg total) by mouth 2 (two) times daily.      Follow-up Information    Kerry Fort., MD. Schedule an appointment as soon as possible for a visit  today.   Specialty:  Urology Why:  To be seen in 3 to 5 days with repeat labs (CBC & CMP). Contact information: [address] [place] [address] [phone]        Family Physician at Urgent Care in [facility]. Schedule an appointment as soon as possible for a visit.   Why:  For lab work in case you are unable to get them done with your Urologist.         No Known Allergies    Procedures/Studies: Dg Abd 1 View  Result Date: 12/30/2017 CLINICAL DATA:  Obstipation. History of cystoscopy and left-sided ureteral stent placement. EXAM: ABDOMEN - 1 VIEW COMPARISON:  CT abdomen pelvis-12/29/2017 FINDINGS: Interval placement of an additional left-sided ureteral stent with superior coil and overlying expected location the superior aspect the left ureter and inferior coil overlying the urinary bladder. Ill-defined opacities again overlie the superior and mid aspect of the chronic left-sided ureteral stent. Multiple opacities overlie the expected location of the urinary bladder. Vascular calcifications overlie the abdomen and lower pelvis. Ill-defined opacities overlie the expected location of the right renal fossa. Moderate colonic stool burden without evidence of enteric obstruction. No supine evidence of pneumoperitoneum. No pneumatosis or portal venous gas. Post cholecystectomy. No acute osseous abnormalities. IMPRESSION: 1. Moderate colonic stool burden without evidence of enteric obstruction. 2. Interval placement of an additional left-sided ureteral stent with grossly unchanged bilateral renal stone burden, left greater than right, as detailed above. Electronically Signed   By: Sandi Mariscal M.D.   On: 12/30/2017 09:12   Dg C-arm 1-60 Min-no Report  Result Date: 12/29/2017 Fluoroscopy was utilized by the requesting physician.  No radiographic interpretation.   Ct Renal Stone Study  Result Date: 12/29/2017 CLINICAL DATA:  Constipation for several days with decreased oral intake.  History of tuberculosis. EXAM: CT ABDOMEN AND PELVIS WITHOUT CONTRAST TECHNIQUE: Multidetector CT imaging of the abdomen and pelvis was performed following the standard protocol without IV contrast. COMPARISON:  Abdominal CT 06/13/2017 and 11/27/2017. Chest CT 11/16/2017 and 05/14/2015. FINDINGS: Lower chest: There is incomplete visualization of a left lower lobe mass seen on the study done last month. This measures up to 3.0 x 2.7 cm on image 1 of series 3. On the most recent examination, this measured up to 4.2 cm and has enlarged from 2017, highly worrisome for bronchogenic carcinoma. No significant residual left pleural effusion. Status post median sternotomy with diffuse aortic and coronary artery atherosclerosis. Hepatobiliary: The liver appears unremarkable in the noncontrast state. No focal hepatic abnormality or biliary dilatation post cholecystectomy. Pancreas:  Unremarkable. No pancreatic ductal dilatation or surrounding inflammatory changes. Spleen: Normal in size without focal abnormality. Adrenals/Urinary Tract: Both adrenal glands appear normal. There is bilateral nephrolithiasis. In the lower pole of the right kidney, there is a 15 mm calculus on image 29/2. There is no right-sided hydronephrosis or hydroureter. Left ureteral stent remains in place. There are multiple stone fragments along the course of the ureteral stent. Stone burden in the lower pole calyces of the left kidney has decreased. Dilatation of the left renal pelvis and calyces has mildly increased. Previously demonstrated left subcapsular hematoma has slightly improved. There is a stable cyst in the upper pole of the left kidney. The bladder appears unremarkable. Stomach/Bowel: No evidence of bowel wall thickening, distention or surrounding inflammatory change. Large amount of stool throughout the colon, especially in the rectum. Vascular/Lymphatic: There are no enlarged abdominal or pelvic lymph nodes. Extensive aortic and branch  vessel atherosclerosis. 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 Reproductive: Diffuse calcifications are again noted throughout the prostate gland which is unchanged in size. Other: No evidence of abdominal wall mass or hernia. No ascites. Musculoskeletal: No acute or significant osseous findings. Stable lumbar spondylosis. Probable decubitus changes over both ischial tuberosities without bone destruction or focal fluid collection. IMPRESSION: 1. Partially imaged left lower lobe mass consistent with bronchogenic carcinoma as seen on recent prior studies. 2. Interval increased dilatation of the left renal collecting system and proximal ureter. The left ureteral stent appears well positioned, although there are multiple stone fragments along the course of the stent status post apparent interval lithotripsy, and the stent may be dysfunctional. Right renal calculi are unchanged. 3. Left renal subcapsular hematoma has improved compared with the previous study. 4. Prominent stool in the rectum.  No evidence of bowel obstruction. 5.  Aortic Atherosclerosis (ICD10-I70.0). Electronically Signed   By: Richardean Sale M.D.   On: 12/29/2017 13:08      Subjective: Denies complaints.  No pain reported.  Patient and son report chronic low blood pressures but no history of passing out or near passing out.  Physically quite active and independent.  Drives and continues to bowl twice a week.  Urinating well.  Had couple of BMs.  They stated that he usually goes to an urgent center in [facility] for healthcare needs.  Discharge Exam:  Vitals:   12/31/17 0457 12/31/17 0500 12/31/17 1055 12/31/17 1426  BP: (!) 84/50 (!) 90/44 (!) 90/56 (!) 82/52  Pulse: 69  67 62  Resp: 18   16  Temp: 98.2 F (36.8 C)   98.7 F (37.1 C)  TempSrc: Oral   Oral  SpO2: 96%   97%  Weight:      Height:        General exam: Elderly male, moderately built and frail, lying comfortably propped up in bed without distress.   Oral mucosa moist Respiratory system: Clear to auscultation. Respiratory effort normal. Cardiovascular system: S1 & S2 heard, RRR. No JVD, murmurs, rubs, gallops or clicks. No pedal edema.  Telemetry personally reviewed: Sinus rhythm with frequent PACs. Gastrointestinal system: Abdomen is nondistended, soft and nontender. No organomegaly or masses felt. Normal bowel sounds heard. Central nervous system: Alert and oriented. No focal neurological deficits. Extremities: Symmetric 5 x 5 power. Skin: No rashes, lesions or ulcers Psychiatry: Judgement and insight appear normal. Mood & affect appropriate.     The results of significant diagnostics from this hospitalization (including imaging, microbiology, ancillary and laboratory) are listed below for reference.  Microbiology: Recent Results (from the past 240 hour(s))  Urine culture     Status: Abnormal   Collection Time: 12/29/17  3:00 PM  Result Value Ref Range Status   Specimen Description   Final    URINE, CLEAN CATCH Performed at [facility], [address] [place], [place] [address]    Special Requests   Final    NONE Performed at [facility], [address] [place], [place] [address]    Culture (A)  Final    10,000 COLONIES/mL MULTIPLE SPECIES PRESENT, SUGGEST RECOLLECTION   Report Status 12/31/2017 FINAL  Final  Urine Culture     Status: None (Preliminary result)   Collection Time: 12/29/17 10:12 PM  Result Value Ref Range Status   Specimen Description CYSTOSCOPY  Final   Special Requests   Final    NONE Performed at [facility], [place] [address], [place], [place] [address]    Culture   Final    CULTURE REINCUBATED FOR BETTER GROWTH Performed at [facility], [place] [address], [place], [place] [address]    Report Status PENDING  Incomplete     Labs: CBC: Recent Labs  Lab 12/29/17 1226 12/30/17 0606 12/31/17 0529  WBC 13.1* 9.6 8.0  NEUTROABS 11.5*  --   --    HGB 11.1* 9.4* 8.5*  HCT 34.7* 30.1* 27.3*  MCV 99.7 102.7* 105.0*  PLT 254 183 607*   Basic Metabolic Panel: Recent Labs  Lab 12/29/17 1226 12/30/17 0116 12/30/17 0606 12/31/17 0529  NA 131* 135 135 136  K 5.4* 4.9 5.2* 4.2  CL 98 101 103 106  CO2 23 24 24 23   GLUCOSE 118* 109* 108* 87  BUN 44* 41* 41* 31*  CREATININE 2.13* 1.66* 1.58* 1.19  CALCIUM 9.4 8.5* 8.5* 8.1*  MG  --   --  2.7*  --   PHOS  --   --  3.7  --    Liver Function Tests: Recent Labs  Lab 12/29/17 1226 12/30/17 0606  AST 90* 75*  ALT 35 30  ALKPHOS 70 53  BILITOT 1.1 1.1  PROT 7.8 6.0*  ALBUMIN 4.2 3.3*    Thyroid function studies Recent Labs    12/30/17 0606  TSH 0.887   Urinalysis    Component Value Date/Time   COLORURINE YELLOW 12/29/2017 1500   APPEARANCEUR CLOUDY (A) 12/29/2017 1500   LABSPEC 1.025 12/29/2017 1500   PHURINE 6.0 12/29/2017 1500   GLUCOSEU NEGATIVE 12/29/2017 1500   HGBUR LARGE (A) 12/29/2017 1500   BILIRUBINUR NEGATIVE 12/29/2017 1500   KETONESUR NEGATIVE 12/29/2017 1500   PROTEINUR 30 (A) 12/29/2017 1500   UROBILINOGEN 1.0 05/18/2011 1047   NITRITE NEGATIVE 12/29/2017 1500   LEUKOCYTESUR LARGE (A) 12/29/2017 1500    I discussed in detail with patient's son at bedside.  Updated care and answered questions.  Time coordinating discharge: 40 minutes  SIGNED:  Vernell Leep, MD, FACP, [facility]. [facility] Pager [phone]  If 7PM-7AM, please contact night-coverage [email or web address] Password [email or web address] 12/31/2017, 3:50 PM

## 2018-01-01 LAB — URINE CULTURE: Culture: 4000 — AB

## 2018-01-28 ENCOUNTER — Telehealth: Payer: Self-pay | Admitting: Cardiology

## 2018-01-28 ENCOUNTER — Other Ambulatory Visit: Payer: Self-pay

## 2018-01-28 DIAGNOSIS — I2510 Atherosclerotic heart disease of native coronary artery without angina pectoris: Secondary | ICD-10-CM

## 2018-01-28 DIAGNOSIS — I483 Typical atrial flutter: Secondary | ICD-10-CM

## 2018-01-28 MED ORDER — FOLIC ACID 1 MG PO TABS: 1.0000 mg | ORAL_TABLET | Freq: Every day | ORAL | 1 refills | Status: DC

## 2018-01-28 NOTE — Telephone Encounter (Signed)
° °  1. Which medications need to be refilled? (please list name of each medication and dose if known) Folic acid 1mg  tablet  2. Which pharmacy/location (including street and city if local pharmacy) is medication to be sent to?[facility]   3. Do they need a 30 day or 90 day supply? [place]

## 2018-01-28 NOTE — Telephone Encounter (Signed)
Medication refill sent to pharmacy  

## 2018-01-28 NOTE — Telephone Encounter (Signed)
Refill sent by Bertell Maria, CMA.

## 2018-03-08 ENCOUNTER — Ambulatory Visit (INDEPENDENT_AMBULATORY_CARE_PROVIDER_SITE_OTHER): Payer: Medicare Other | Admitting: Cardiology

## 2018-03-08 ENCOUNTER — Encounter: Payer: Self-pay | Admitting: Cardiology

## 2018-03-08 ENCOUNTER — Ambulatory Visit: Payer: Medicare Other | Admitting: Cardiology

## 2018-03-08 VITALS — BP 114/60 | HR 68 | Ht 65.0 in | Wt 129.4 lb

## 2018-03-08 DIAGNOSIS — I483 Typical atrial flutter: Secondary | ICD-10-CM | POA: Diagnosis not present

## 2018-03-08 DIAGNOSIS — I493 Ventricular premature depolarization: Secondary | ICD-10-CM | POA: Diagnosis not present

## 2018-03-08 DIAGNOSIS — Z8719 Personal history of other diseases of the digestive system: Secondary | ICD-10-CM

## 2018-03-08 DIAGNOSIS — Z951 Presence of aortocoronary bypass graft: Secondary | ICD-10-CM

## 2018-03-08 NOTE — Progress Notes (Signed)
Cardiology Office Note:    Date:  03/08/2018   ID:  Jacob Hampton, DOB 01/22/1924, MRN [id number]  PCP:  Patient, No Pcp Per  Cardiologist:  Jenne Campus, MD    Referring MD: No ref. provider found   Chief Complaint  Patient presents with  . Follow-up  Better  History of Present Illness:    Jacob Hampton is a 85 y.o. male with coronary disease status post coronary artery bypass graft, paroxysmal atrial flutter status post atrial flutter ablation.  Recent multiple lithotripsy.  Complicated by hematuria.  Now slowly gradually recovering from it seems to be in good spirits overall seems to be doing well.  Past Medical History:  Diagnosis Date  . Anemia   . Angina   . Arthritis   . Atrial flutter ([place])   . Blood transfusion    Had 8 units for GI bleed  . CAD (coronary artery disease)    Dr. Wynonia Lawman is his Cardiologist  . Dizzy    "when I take my blood pressure medicine sometimes it makes me a little dizzy"  . GERD (gastroesophageal reflux disease)   . Heart murmur   . History of primary tuberculosis   . History of upper GI bleeding    8 units for GI bleed   . Hyperlipidemia   . Hypertension   . Kidney stones   . Malignant neoplasm of upper lobe, unspecified bronchus or lung ([place])   . Pneumonia    h/o walking pneumonia at least 20 years ago  . Presence of aortocoronary bypass graft   . Presence of coronary angioplasty implant and graft   . Shortness of breath 05/18/11   "sometimes lying down; sometimes w/activity"  . Sinus drainage    11/24/10 "every morning when I get up I have to clear it out"  . Sleep disorder    "trouble staying asleep"  . Tuberculosis 1969   Was in sanitorium for 1 year  . Ventricular premature depolarization     Past Surgical History:  Procedure Laterality Date  . A-FLUTTER ABLATION N/A 09/12/2017   Procedure: A-FLUTTER ABLATION;  Surgeon: Evans Lance, MD;  Location: [facility];  Service: Cardiovascular;  Laterality: N/A;  .  APPENDECTOMY    . bleeding ulcer     w/gallbladder  . CATARACT EXTRACTION W/ INTRAOCULAR LENS IMPLANT     bilaterally  . CHOLECYSTECTOMY     "w/bleeding ulcer OR"  . CORONARY ANGIOPLASTY WITH STENT PLACEMENT  02/2011  . CORONARY ARTERY BYPASS GRAFT  03/25/1999   CABG x5 using LIMA to LAD, SVG to Diag, SVG to OM, sequential SVG to PD and RPL, open SV harvest from left thigh and leg  . CORONARY ARTERY BYPASS GRAFT  11/22/2010   Procedure: REDO CORONARY ARTERY BYPASS GRAFTING (CABG)TIMES TWO;  Surgeon: Rexene Alberts, MD;  Location: [facility];  Service: Open Heart Surgery;  Laterality: N/A;  using endoscopically harvested right saphenous vein and left radial artery; Transesophageal echocardiogram  . CYSTOSCOPY W/ URETERAL STENT PLACEMENT Left 12/29/2017   Procedure: CYSTOSCOPY WITH RETROGRADE PYELOGRAM/URETERAL STENT EXCHANGE;  Surgeon: Irine Seal, MD;  Location: [facility];  Service: Urology;  Laterality: Left;  . INGUINAL HERNIA REPAIR     ? left  . LEFT HEART CATHETERIZATION WITH CORONARY/GRAFT ANGIOGRAM N/A 03/01/2011   Procedure: LEFT HEART CATHETERIZATION WITH Beatrix Fetters;  Surgeon: Jettie Booze, MD;  Location[facility];  Service: Cardiovascular;  Laterality: N/A;  . LEFT HEART CATHETERIZATION WITH CORONARY/GRAFT ANGIOGRAM N/A 05/19/2011  Procedure: LEFT HEART CATHETERIZATION WITH Beatrix Fetters;  Surgeon: Jettie Booze, MD;  Location: [facility] CATH LAB;  Service: Cardiovascular;  Laterality: N/A;  . LITHOTRIPSY    . OTHER SURGICAL HISTORY  03/25/1999   reexploration for bleeding s/p CABG - coagulopathy without mechanical source  . PARTIAL GASTRECTOMY    . PERCUTANEOUS CORONARY STENT INTERVENTION (PCI-S) N/A 03/02/2011   Procedure: PERCUTANEOUS CORONARY STENT INTERVENTION (PCI-S);  Surgeon: Jettie Booze, MD;  Location: [facility] CATH LAB;  Service: Cardiovascular;  Laterality: N/A;  . PERCUTANEOUS CORONARY STENT INTERVENTION (PCI-S)  05/19/2011   Procedure:  PERCUTANEOUS CORONARY STENT INTERVENTION (PCI-S);  Surgeon: Jettie Booze, MD;  Location: [facility] CATH LAB;  Service: Cardiovascular;;  . RADIAL ARTERY HARVEST  11/22/2010   Procedure: RADIAL ARTERY HARVEST;  Surgeon: Rexene Alberts, MD;  Location: [facility];  Service: Open Heart Surgery;  Laterality: Left;  . Redo CABG  11/22/2010   CABG x2 with left radial to OM and SVG to RCA  . Right testicle surgery      Current Medications: Current Meds  Medication Sig  . aspirin EC 81 MG tablet Take 81 mg by mouth at bedtime.  Marland Kitchen atorvastatin (LIPITOR) 80 MG tablet Take 80 mg by mouth at bedtime.   . clopidogrel (PLAVIX) 75 MG tablet Take 75 mg by mouth at bedtime.  Marland Kitchen ezetimibe (ZETIA) 10 MG tablet Take 10 mg by mouth at bedtime.  . feeding supplement, ENSURE ENLIVE, (ENSURE ENLIVE) LIQD Take 237 mLs by mouth 2 (two) times daily between meals.  . folic acid (FOLVITE) 1 MG tablet Take 1 tablet (1 mg total) by mouth at bedtime.  . nitroGLYCERIN (NITROSTAT) 0.4 MG SL tablet Place 0.4 mg under the tongue every 5 (five) minutes as needed. For chest pain     Allergies:   Patient has no known allergies.   Social History   Socioeconomic History  . Marital status: Widowed    Spouse name: Not on file  . Number of children: Not on file  . Years of education: Not on file  . Highest education level: Not on file  Occupational History  . Not on file  Social Needs  . Financial resource strain: Not on file  . Food insecurity:    Worry: Not on file    Inability: Not on file  . Transportation needs:    Medical: Not on file    Non-medical: Not on file  Tobacco Use  . Smoking status: Former Smoker    Packs/day: 0.25    Years: 25.00    Pack years: 6.25    Types: Cigarettes    Last attempt to quit: 01/17/1975    Years since quitting: 43.1  . Smokeless tobacco: Never Used  Substance and Sexual Activity  . Alcohol use: No    Alcohol/week: 0.0 standard drinks  . Drug use: No  . Sexual activity: Not on file   Lifestyle  . Physical activity:    Days per week: Not on file    Minutes per session: Not on file  . Stress: Not on file  Relationships  . Social connections:    Talks on phone: Not on file    Gets together: Not on file    Attends religious service: Not on file    Active member of club or organization: Not on file    Attends meetings of clubs or organizations: Not on file    Relationship status: Not on file  Other Topics Concern  . Not on  file  Social History Narrative   Retired [employer], 2 sons, 2 daughters   Bowls regularly.     Family History: The patient's family history includes Alzheimer's disease in his sister; Bowel Disease in his brother; Cancer in his brother and father; Heart attack in his sister; Stroke in his brother; Unexplained death in his brother and mother. ROS:   Please see the history of present illness.    All 14 point review of systems negative except as described per history of present illness  EKGs/Labs/Other Studies Reviewed:    EKG done today showed normal sinus rhythm rate 92.  Right bundle branch block, evidence of lateral MI.  No acute changes  Recent Labs: 12/30/2017: ALT 30; Magnesium 2.7; TSH 0.887 12/31/2017: BUN 31; Creatinine, Ser 1.19; Hemoglobin 8.5; Platelets 132; Potassium 4.2; Sodium 136  Recent Lipid Panel    Component Value Date/Time   CHOL 117 03/01/2011 0531   TRIG 68 03/01/2011 0531   HDL 42 03/01/2011 0531   CHOLHDL 2.8 03/01/2011 0531   VLDL 14 03/01/2011 0531   LDLCALC 61 03/01/2011 0531    Physical Exam:    VS:  BP 114/60   Pulse 68   Ht 5\' 5"  (1.651 m)   Wt 129 lb 6.4 oz (58.7 kg)   SpO2 95%   BMI 21.53 kg/m     Wt Readings from Last 3 Encounters:  03/08/18 129 lb 6.4 oz (58.7 kg)  12/29/17 128 lb (58.1 kg)  11/23/17 128 lb 6.4 oz (58.2 kg)     GEN:  Well nourished, well developed in no acute distress HEENT: Normal NECK: No JVD; No carotid bruits LYMPHATICS: No  lymphadenopathy CARDIAC: RRR, no murmurs, no rubs, no gallops RESPIRATORY:  Clear to auscultation without rales, wheezing or rhonchi  ABDOMEN: Soft, non-tender, non-distended MUSCULOSKELETAL:  No edema; No deformity  SKIN: Warm and dry LOWER EXTREMITIES: no swelling NEUROLOGIC:  Alert and oriented x 3 PSYCHIATRIC:  Normal affect   ASSESSMENT:    1. Typical atrial flutter ([place])   2. History of CABG   3. History of upper GI bleeding   4. Ventricular premature depolarization    PLAN:    In order of problems listed above:  1. Typical atrial flutter status post ablation.  Will do EKG to check the rhythm not anticoagulated because of history of GI bleed as well as recent hematuria on top of that if still truly only arrhythmia he got was atrial flutter that hopefully was successfully ablated. 2. History of coronary artery bypass graft on appropriate medications which I will continue. 3. History of GI bleed.  Doing well from that point review 4. PVCs.  Stable on my physical examination today I do not see and hear any extrasystole but EKG will be done   Medication Adjustments/Labs and Tests Ordered: Current medicines are reviewed at length with the patient today.  Concerns regarding medicines are outlined above.  No orders of the defined types were placed in this encounter.  Medication changes: No orders of the defined types were placed in this encounter.   Signed, Park Liter, MD, [facility] 03/08/2018 [facility]

## 2018-03-08 NOTE — Patient Instructions (Signed)
Medication Instructions:  Your physician recommends that you continue on your current medications as directed. Please refer to the Current Medication list given to you today.  If you need a refill on your cardiac medications before your next appointment, please call your pharmacy.   Lab work: None. If you have labs (blood work) drawn today and your tests are completely normal, you will receive your results only by: Marland Kitchen MyChart Message (if you have MyChart) OR . A paper copy in the mail If you have any lab test that is abnormal or we need to change your treatment, we will call you to review the results.  Testing/Procedures: None.   Follow-Up: At [facility], you and your health needs are our priority.  As part of our continuing mission to provide you with exceptional heart care, we have created designated Provider Care Teams.  These Care Teams include your primary Cardiologist (physician) and Advanced Practice Providers (APPs -  Physician Assistants and Nurse Practitioners) who all work together to provide you with the care you need, when you need it.  You will follow up in [facility] with Dr. Agustin Cree in 5 months   Any Other Special Instructions Will Be Listed Below (If Applicable).

## 2018-03-27 IMAGING — PT NM PET TUM IMG INITIAL (PI) SKULL BASE T - THIGH
1 of 7 series · 2 of 25 positions shown · non-contrast
Comparison: CT thorax 05/14/2015 CT abdomen 05/09/2015

CLINICAL DATA: Initial treatment strategy for lung nodule.

EXAM:
NUCLEAR MEDICINE PET SKULL BASE TO THIGH
TECHNIQUE: 7.0 mCi F-18 FDG was injected intravenously. Full-ring PET imaging
was performed from the skull base to thigh after the radiotracer. CT
data was obtained and used for attenuation correction and anatomic
localization.
FASTING BLOOD GLUCOSE:  Value: 87 mg/dl

[Series 4: ct sk_thigh 5.0 b31f · axial · 5.0mm · 0.98mm/px · z∈[-220,-44]mm · 2 of 220 slices shown]
[im 176/220  brain]
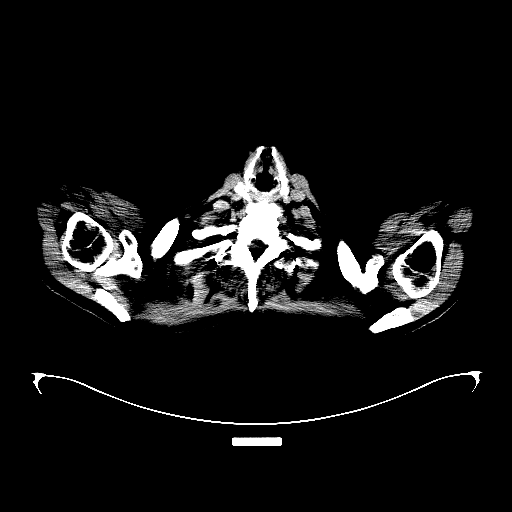
[im 220/220  brain]
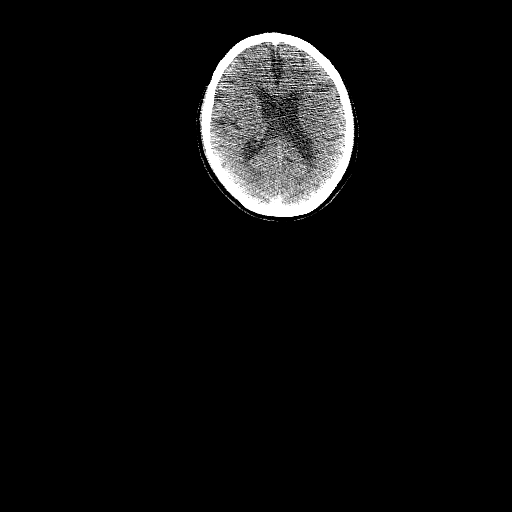

[2 of 25 positions shown; findings below may reference images not displayed]

FINDINGS: NECK

No hypermetabolic lymph nodes in the neck.

CHEST

In the medial aspect of the LEFT lower lobe, spiculated nodule
measuring 15 x 14 mm has intense metabolic activity (SUV max equals
7.4). No additional hypermetabolic nodules within the lungs.

Nodular linear parenchymal thickening at the RIGHT lung apex without
associated significant metabolic activity.

No hypermetabolic mediastinal lymph nodes.

ABDOMEN/PELVIS

No abnormal hypermetabolic activity within the liver, pancreas,
adrenal glands, or spleen. No hypermetabolic lymph nodes in the
abdomen or pelvis.

Bilateral nephrolithiasis. Aneurysm dilatation of the LEFT internal
iliac artery.

SKELETON

No focal hypermetabolic activity to suggest skeletal metastasis.
IMPRESSION: 1. Hypermetabolic nodule in the LEFT lower lobe consistent with
primary bronchogenic carcinoma.
2. No evidence mediastinal nodal metastasis or distant metastasis.
These results will be called to the ordering clinician or
representative by the Radiologist Assistant, and communication
documented in the PACS or zVision Dashboard.

## 2018-03-29 ENCOUNTER — Telehealth: Payer: Self-pay | Admitting: Cardiology

## 2018-03-29 DIAGNOSIS — I2510 Atherosclerotic heart disease of native coronary artery without angina pectoris: Secondary | ICD-10-CM

## 2018-03-29 DIAGNOSIS — I483 Typical atrial flutter: Secondary | ICD-10-CM

## 2018-03-29 MED ORDER — FOLIC ACID 1 MG PO TABS: 1.0000 mg | ORAL_TABLET | Freq: Every day | ORAL | 1 refills | Status: DC

## 2018-03-29 NOTE — Telephone Encounter (Signed)
Refilled folic acid 1 mg daily for patient.

## 2018-03-29 NOTE — Telephone Encounter (Signed)
°*  STAT* If patient is at the pharmacy, call can be transferred to refill team.   1. Which medications need to be refilled? (please list name of each medication and dose if known) folic acid 1mg  tablet  2. Which pharmacy/location (including street and city if local pharmacy) is medication to be sent to? [facility] in [place]  3. Do they need a 30 day or 90 day supply? [place]

## 2018-04-26 ENCOUNTER — Ambulatory Visit: Payer: Medicare Other | Admitting: Internal Medicine

## 2018-04-29 ENCOUNTER — Ambulatory Visit: Payer: Medicare Other | Admitting: Internal Medicine

## 2018-05-01 ENCOUNTER — Ambulatory Visit: Payer: Medicare Other | Admitting: Internal Medicine

## 2018-05-05 IMAGING — DX DG CHEST 2V
2 series · 2 of 2 positions shown · non-contrast
Comparison: Chest x-ray of May 14, 2015 and May 18, 2011. PET-CT
study May 27, 2015.

CLINICAL DATA: Low up of lung nodules, no complaints, history of
previous CABG

EXAM:
CHEST  2 VIEW

[chest pa]
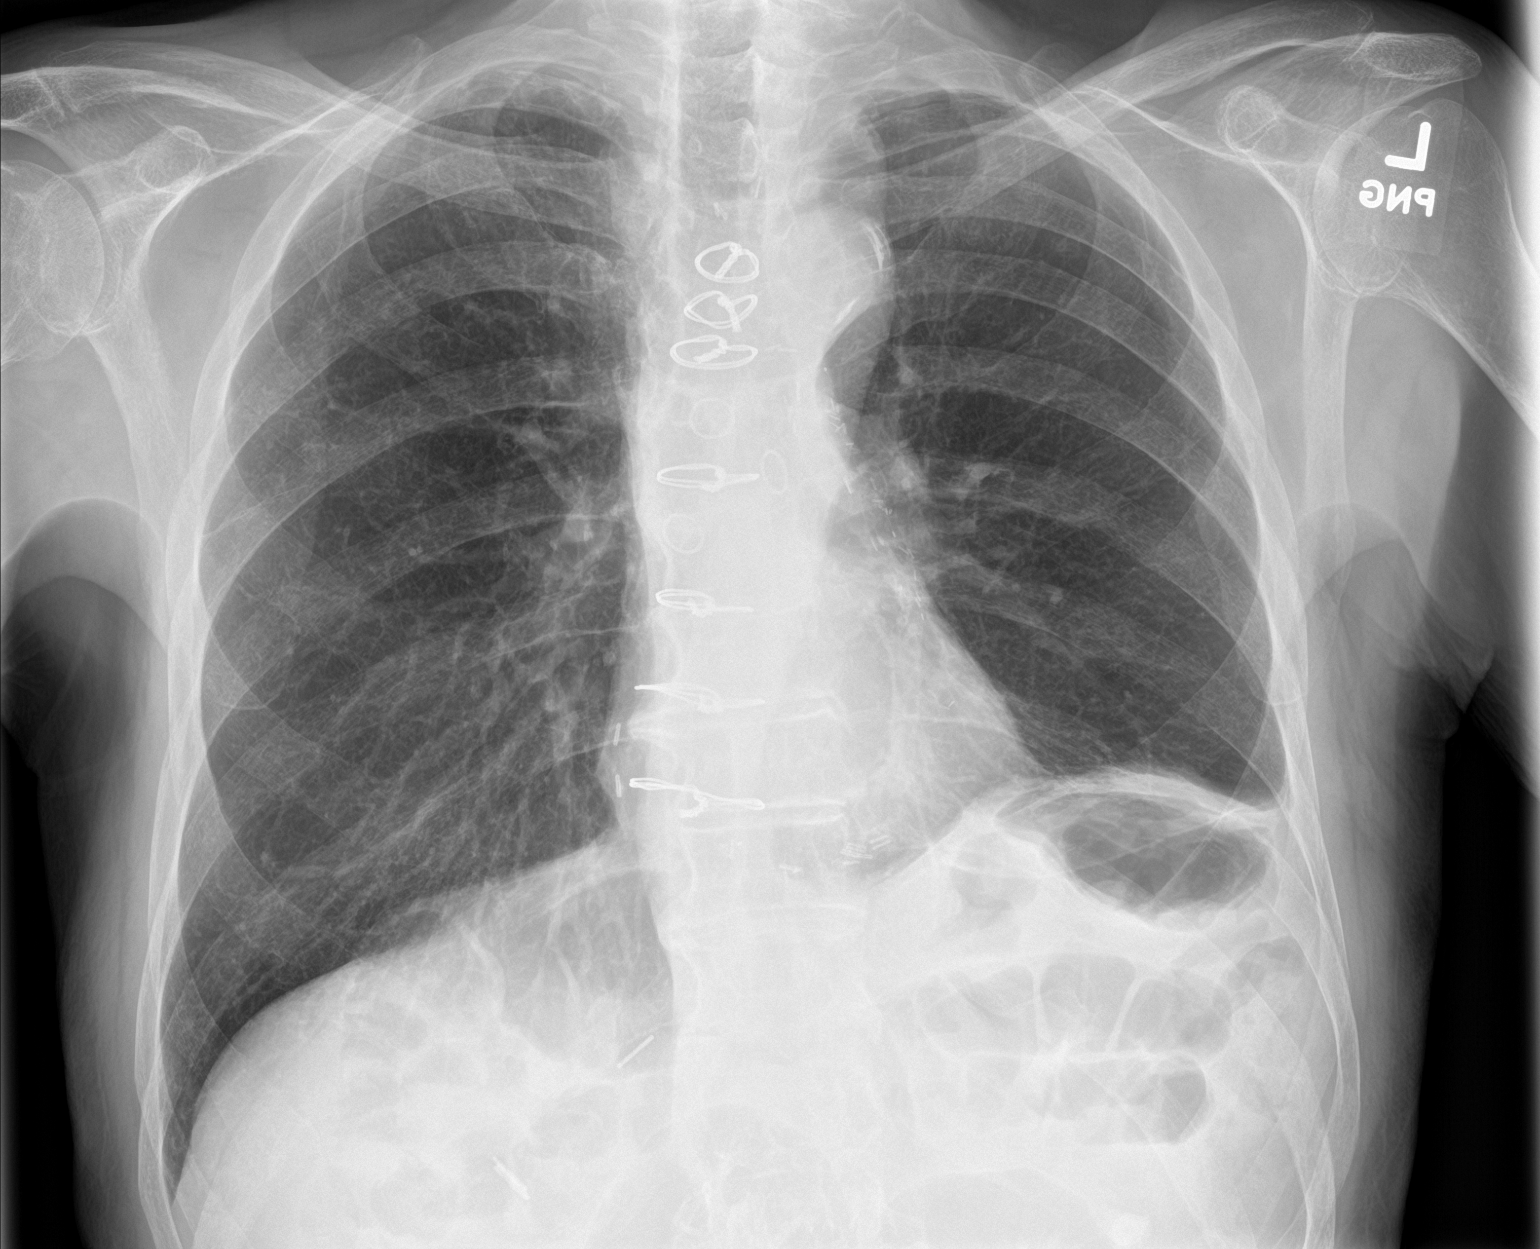

[chest lat]
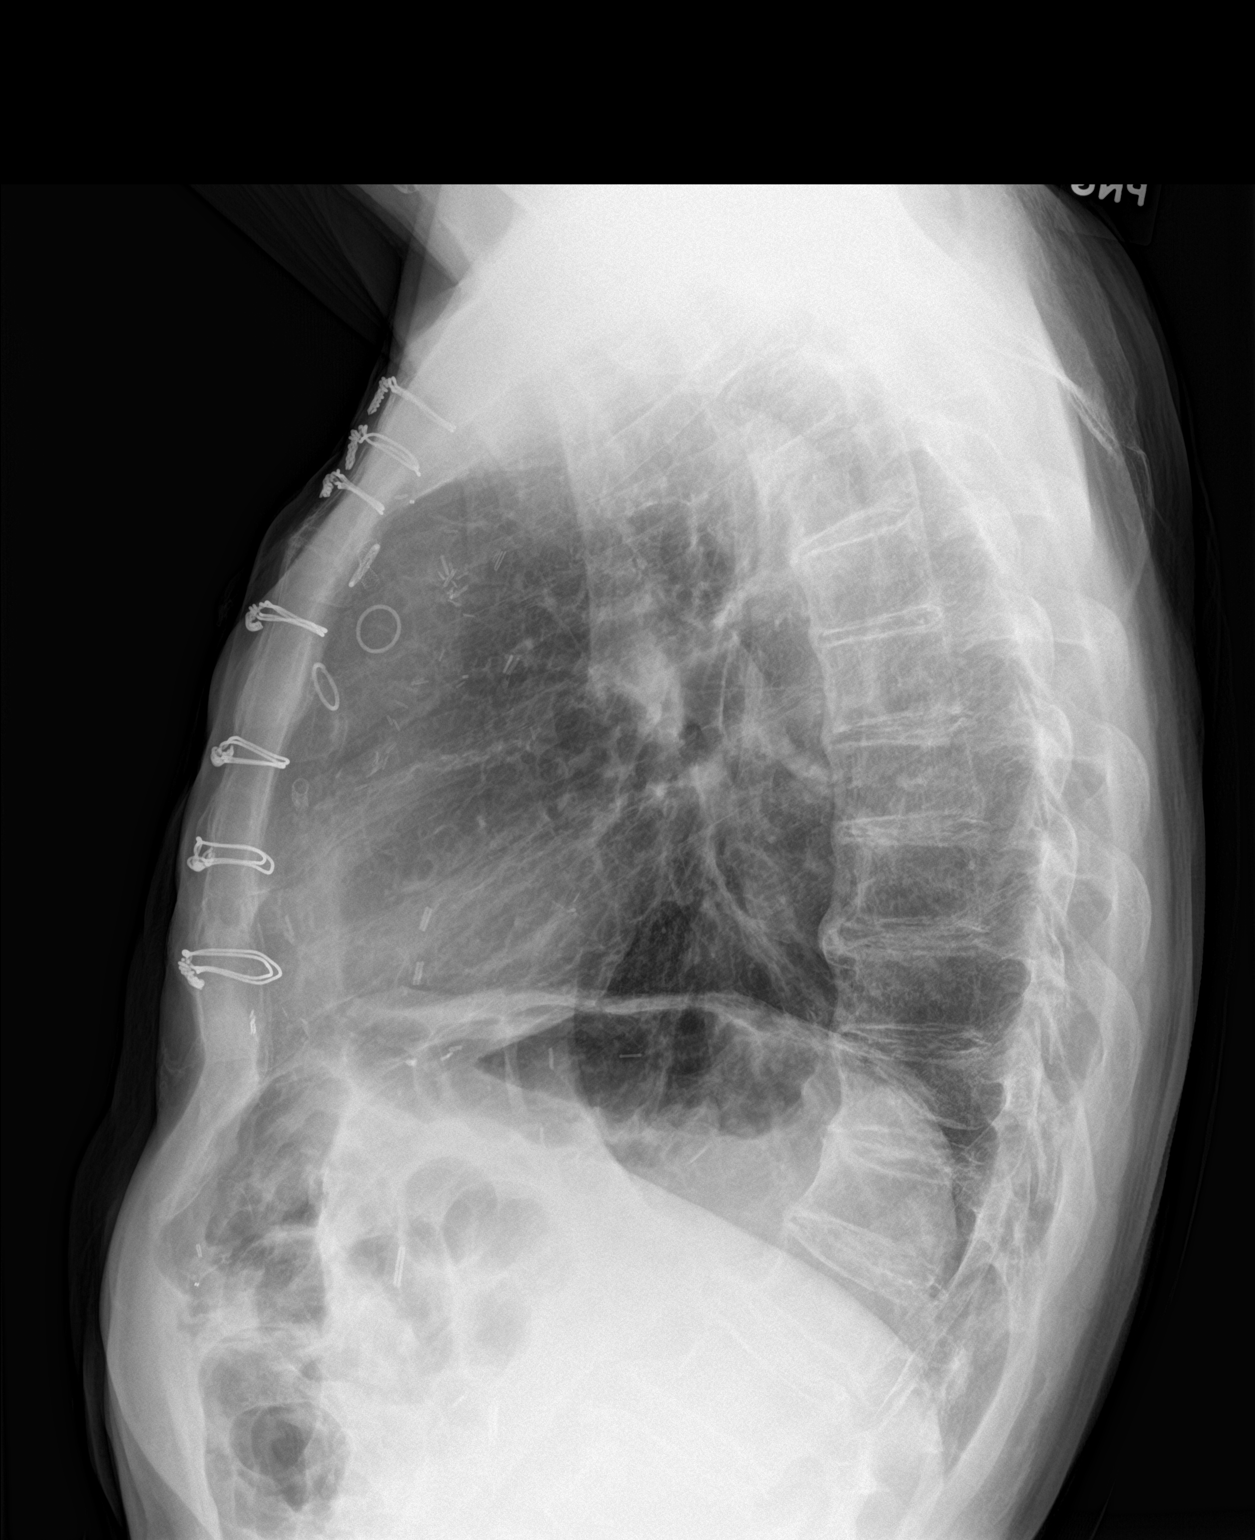

[2 of 2 positions shown; findings below may reference images not displayed]

FINDINGS: A known nodule in the left lower lobe is not evident on today's
plain film study. There is mild volume loss on the left with chronic
blunting of the lateral costophrenic angle. The heart is normal in
size. There is tortuosity of the descending thoracic aorta. The
sternal wires are intact. Three coronary artery graft markers are
visible. There is mild multilevel degenerative disc disease of the
mid and lower thoracic spine.
IMPRESSION: No pulmonary parenchymal nodules or masses are observed. A known
left lower lobe mass demonstrated on previous CT studies is not
evident on this plain film. There are chronic changes involving the
left lateral costophrenic gutter and left lung. Stable post CABG
changes.

## 2018-05-08 ENCOUNTER — Other Ambulatory Visit: Payer: Self-pay | Admitting: Cardiology

## 2018-05-08 DIAGNOSIS — I483 Typical atrial flutter: Secondary | ICD-10-CM

## 2018-05-08 DIAGNOSIS — I2510 Atherosclerotic heart disease of native coronary artery without angina pectoris: Secondary | ICD-10-CM

## 2018-05-08 NOTE — Telephone Encounter (Signed)
°*  STAT* If patient is at the pharmacy, call can be transferred to refill team.   1. Which medications need to be refilled? (please list name of each medication and dose if known) atorvastatin (LIPITOR) 80 MG           clopidogrel (PLAVIX) 75 MG  ezetimibe (ZETIA) 10 MG                folic acid (FOLVITE) 1 MG     2. Which pharmacy/location (including street and city if local pharmacy) is medication to be sent to?  [place], [place] - [address] [phone] (Phone) [phone] (Fax)    3. Do they need a 30 day or 90 day supply? 90 day

## 2018-05-09 MED ORDER — CLOPIDOGREL BISULFATE 75 MG PO TABS: 75.0000 mg | ORAL_TABLET | Freq: Every day | ORAL | 1 refills | Status: DC

## 2018-05-09 MED ORDER — ATORVASTATIN CALCIUM 80 MG PO TABS: 80.0000 mg | ORAL_TABLET | Freq: Every day | ORAL | 1 refills | Status: DC

## 2018-05-09 MED ORDER — EZETIMIBE 10 MG PO TABS: 10.0000 mg | ORAL_TABLET | Freq: Every day | ORAL | 1 refills | Status: DC

## 2018-05-09 NOTE — Telephone Encounter (Signed)
Atorvastatin, Clopidogrel and Zetia sent to [facility] in [place]

## 2018-05-09 NOTE — Telephone Encounter (Signed)
Sent Folic Acid to [facility] in [place]

## 2018-07-12 ENCOUNTER — Encounter: Payer: Self-pay | Admitting: Cardiology

## 2018-07-12 ENCOUNTER — Other Ambulatory Visit: Payer: Self-pay

## 2018-07-12 ENCOUNTER — Telehealth (INDEPENDENT_AMBULATORY_CARE_PROVIDER_SITE_OTHER): Payer: Medicare Other | Admitting: Cardiology

## 2018-07-12 DIAGNOSIS — Z8679 Personal history of other diseases of the circulatory system: Secondary | ICD-10-CM | POA: Insufficient documentation

## 2018-07-12 DIAGNOSIS — Z951 Presence of aortocoronary bypass graft: Secondary | ICD-10-CM

## 2018-07-12 DIAGNOSIS — I483 Typical atrial flutter: Secondary | ICD-10-CM | POA: Diagnosis not present

## 2018-07-12 DIAGNOSIS — I2510 Atherosclerotic heart disease of native coronary artery without angina pectoris: Secondary | ICD-10-CM

## 2018-07-12 DIAGNOSIS — Z9889 Other specified postprocedural states: Secondary | ICD-10-CM | POA: Insufficient documentation

## 2018-07-12 HISTORY — DX: Personal history of other diseases of the circulatory system: Z86.79

## 2018-07-12 HISTORY — DX: Other specified postprocedural states: Z98.890

## 2018-07-12 NOTE — Progress Notes (Signed)
Virtual Visit via Telephone Note   This visit type was conducted due to national recommendations for restrictions regarding the COVID-19 Pandemic (e.g. social distancing) in an effort to limit this patient's exposure and mitigate transmission in our community.  Due to his co-morbid illnesses, this patient is at least at moderate risk for complications without adequate follow up.  This format is felt to be most appropriate for this patient at this time.  The patient did not have access to video technology/had technical difficulties with video requiring transitioning to audio format only (telephone).  All issues noted in this document were discussed and addressed.  No physical exam could be performed with this format.  Please refer to the patient's chart for his  consent to telehealth for [facility].  Evaluation Performed:  Follow-up visit  This visit type was conducted due to national recommendations for restrictions regarding the COVID-19 Pandemic (e.g. social distancing).  This format is felt to be most appropriate for this patient at this time.  All issues noted in this document were discussed and addressed.  No physical exam was performed (except for noted visual exam findings with Video Visits).  Please refer to the patient's chart (MyChart message for video visits and phone note for telephone visits) for the patient's consent to telehealth for [facility].  Date:  07/12/2018  ID: Jacob Hampton, DOB June 26, 1924, MRN [id number]   Patient Location: [address] [place] [address]   Provider location:   [place] Office  PCP:  Patient, No Pcp Per  Cardiologist:  Jenne Campus, MD     Chief Complaint: Doing well  History of Present Illness:    Jacob Hampton is a 85 y.o. male  who presents via audio/video conferencing for a telehealth visit today.  Past medical history significant for atrial flutter, status post ablation done last summer, coronary artery  disease status post coronary artery bypass graft.  He is not anticoagulated because of history of GI bleed.  Also did have history of hematuria.  On top of that his atrial flutter is atrial flutter hopefully being successfully ablated.  He is asymptomatic no palpitations no chest pain no dizziness no passing out no swelling of lower extremities overall cardiac wise doing well   The patient does not have symptoms concerning for COVID-19 infection (fever, chills, cough, or new SHORTNESS OF BREATH).    Prior CV studies:   The following studies were reviewed today:       Past Medical History:  Diagnosis Date  . Anemia   . Angina   . Arthritis   . Atrial flutter ([place])   . Blood transfusion    Had 8 units for GI bleed  . CAD (coronary artery disease)    Dr. Wynonia Lawman is his Cardiologist  . Dizzy    "when I take my blood pressure medicine sometimes it makes me a little dizzy"  . GERD (gastroesophageal reflux disease)   . Heart murmur   . History of primary tuberculosis   . History of upper GI bleeding    8 units for GI bleed   . Hyperlipidemia   . Hypertension   . Kidney stones   . Malignant neoplasm of upper lobe, unspecified bronchus or lung ([place])   . Pneumonia    h/o walking pneumonia at least 20 years ago  . Presence of aortocoronary bypass graft   . Presence of coronary angioplasty implant and graft   . Shortness of breath 05/18/11   "  sometimes lying down; sometimes w/activity"  . Sinus drainage    11/24/10 "every morning when I get up I have to clear it out"  . Sleep disorder    "trouble staying asleep"  . Tuberculosis 1969   Was in sanitorium for 1 year  . Ventricular premature depolarization     Past Surgical History:  Procedure Laterality Date  . A-FLUTTER ABLATION N/A 09/12/2017   Procedure: A-FLUTTER ABLATION;  Surgeon: Evans Lance, MD;  Location: [facility];  Service: Cardiovascular;  Laterality: N/A;  . APPENDECTOMY    . bleeding ulcer      w/gallbladder  . CATARACT EXTRACTION W/ INTRAOCULAR LENS IMPLANT     bilaterally  . CHOLECYSTECTOMY     "w/bleeding ulcer OR"  . CORONARY ANGIOPLASTY WITH STENT PLACEMENT  02/2011  . CORONARY ARTERY BYPASS GRAFT  03/25/1999   CABG x5 using LIMA to LAD, SVG to Diag, SVG to OM, sequential SVG to PD and RPL, open SV harvest from left thigh and leg  . CORONARY ARTERY BYPASS GRAFT  11/22/2010   Procedure: REDO CORONARY ARTERY BYPASS GRAFTING (CABG)TIMES TWO;  Surgeon: Rexene Alberts, MD;  Location: [facility];  Service: Open Heart Surgery;  Laterality: N/A;  using endoscopically harvested right saphenous vein and left radial artery; Transesophageal echocardiogram  . CYSTOSCOPY W/ URETERAL STENT PLACEMENT Left 12/29/2017   Procedure: CYSTOSCOPY WITH RETROGRADE PYELOGRAM/URETERAL STENT EXCHANGE;  Surgeon: Irine Seal, MD;  Location: [facility];  Service: Urology;  Laterality: Left;  . INGUINAL HERNIA REPAIR     ? left  . LEFT HEART CATHETERIZATION WITH CORONARY/GRAFT ANGIOGRAM N/A 03/01/2011   Procedure: LEFT HEART CATHETERIZATION WITH Beatrix Fetters;  Surgeon: Jettie Booze, MD;  Location: [facility] CATH LAB;  Service: Cardiovascular;  Laterality: N/A;  . LEFT HEART CATHETERIZATION WITH CORONARY/GRAFT ANGIOGRAM N/A 05/19/2011   Procedure: LEFT HEART CATHETERIZATION WITH Beatrix Fetters;  Surgeon: Jettie Booze, MD;  Location: [facility] CATH LAB;  Service: Cardiovascular;  Laterality: N/A;  . LITHOTRIPSY    . OTHER SURGICAL HISTORY  03/25/1999   reexploration for bleeding s/p CABG - coagulopathy without mechanical source  . PARTIAL GASTRECTOMY    . PERCUTANEOUS CORONARY STENT INTERVENTION (PCI-S) N/A 03/02/2011   Procedure: PERCUTANEOUS CORONARY STENT INTERVENTION (PCI-S);  Surgeon: Jettie Booze, MD;  Location: [facility] CATH LAB;  Service: Cardiovascular;  Laterality: N/A;  . PERCUTANEOUS CORONARY STENT INTERVENTION (PCI-S)  05/19/2011   Procedure: PERCUTANEOUS CORONARY STENT INTERVENTION  (PCI-S);  Surgeon: Jettie Booze, MD;  Location: [facility] CATH LAB;  Service: Cardiovascular;;  . RADIAL ARTERY HARVEST  11/22/2010   Procedure: RADIAL ARTERY HARVEST;  Surgeon: Rexene Alberts, MD;  Location: [facility];  Service: Open Heart Surgery;  Laterality: Left;  . Redo CABG  11/22/2010   CABG x2 with left radial to OM and SVG to RCA  . Right testicle surgery       Current Meds  Medication Sig  . atorvastatin (LIPITOR) 80 MG tablet Take 1 tablet (80 mg total) by mouth at bedtime.  . clopidogrel (PLAVIX) 75 MG tablet Take 1 tablet (75 mg total) by mouth at bedtime.  Marland Kitchen ezetimibe (ZETIA) 10 MG tablet Take 1 tablet (10 mg total) by mouth at bedtime.  . feeding supplement, ENSURE ENLIVE, (ENSURE ENLIVE) LIQD Take 237 mLs by mouth 2 (two) times daily between meals.  . folic acid (FOLVITE) 1 MG tablet TAKE 1 TABLET BY MOUTH AT BEDTIME  . nitroGLYCERIN (NITROSTAT) 0.4 MG SL tablet Place 0.4 mg under  the tongue every 5 (five) minutes as needed. For chest pain      Family History: The patient's family history includes Alzheimer's disease in his sister; Bowel Disease in his brother; Cancer in his brother and father; Heart attack in his sister; Stroke in his brother; Unexplained death in his brother and mother.   ROS:   Please see the history of present illness.     All other systems reviewed and are negative.   Labs/Other Tests and Data Reviewed:     Recent Labs: 12/30/2017: ALT 30; Magnesium 2.7; TSH 0.887 12/31/2017: BUN 31; Creatinine, Ser 1.19; Hemoglobin 8.5; Platelets 132; Potassium 4.2; Sodium 136  Recent Lipid Panel    Component Value Date/Time   CHOL 117 03/01/2011 0531   TRIG 68 03/01/2011 0531   HDL 42 03/01/2011 0531   CHOLHDL 2.8 03/01/2011 0531   VLDL 14 03/01/2011 0531   LDLCALC 61 03/01/2011 0531      Exam:    Vital Signs:  There were no vitals taken for this visit.    Wt Readings from Last 3 Encounters:  03/08/18 129 lb 6.4 oz (58.7 kg)  12/29/17 128 lb  (58.1 kg)  11/23/17 128 lb 6.4 oz (58.2 kg)     Well nourished, well developed in no acute distress. Alert awake in exam 3 happy to be able to talk to me over the phone.  Unable to establish video link.  Denies having any chest pain tightness squeezing pressure burning chest.  Diagnosis for this visit:   1. Typical atrial flutter ([place])   2. Status post ablation of atrial flutter summer 2019   3. Presence of aortocoronary bypass graft   4. Coronary artery disease involving native coronary artery of native heart without angina pectoris      ASSESSMENT & PLAN:    1.  Typical atrial flutter status post ablation not anticoagulated because of fragile state history of GI bleed.  No recurrences of palpitations. 2.  Status post atrial flutter ablation appears to be successful. 3.  Coronary artery disease doing well from that point review asymptomatic 4.  Dyslipidemia on atorvastatin which I will continue.  Will call primary care physician to get fasting lipid profile  COVID-19 Education: The signs and symptoms of COVID-19 were discussed with the patient and how to seek care for testing (follow up with PCP or arrange E-visit).  The importance of social distancing was discussed today.  Patient Risk:   After full review of this patients clinical status, I feel that they are at least moderate risk at this time.  Time:   Today, I have spent 14 minutes with the patient with telehealth technology discussing pt health issues.  I spent 5 minutes reviewing her chart before the visit.  Visit was finished at 8:23 AM.    Medication Adjustments/Labs and Tests Ordered: Current medicines are reviewed at length with the patient today.  Concerns regarding medicines are outlined above.  No orders of the defined types were placed in this encounter.  Medication changes: No orders of the defined types were placed in this encounter.    Disposition: Follow-up in 4 months  Signed, Park Liter, MD,  [facility] 07/12/2018 8:23 AM    [place]

## 2018-07-12 NOTE — Patient Instructions (Signed)
Medication Instructions:  Your physician recommends that you continue on your current medications as directed. Please refer to the Current Medication list given to you today.  If you need a refill on your cardiac medications before your next appointment, please call your pharmacy.   Lab work: None.  If you have labs (blood work) drawn today and your tests are completely normal, you will receive your results only by: . MyChart Message (if you have MyChart) OR . A paper copy in the mail If you have any lab test that is abnormal or we need to change your treatment, we will call you to review the results.  Testing/Procedures: None.    Follow-Up: At [facility], you and your health needs are our priority.  As part of our continuing mission to provide you with exceptional heart care, we have created designated Provider Care Teams.  These Care Teams include your primary Cardiologist (physician) and Advanced Practice Providers (APPs -  Physician Assistants and Nurse Practitioners) who all work together to provide you with the care you need, when you need it. You will need a follow up appointment in 4 months.  Please call our office 2 months in advance to schedule this appointment.  You may see No primary care provider on file. or another member of our [facility] Provider Team in [place]: Brian Munley, MD . Rajan Revankar, MD  Any Other Special Instructions Will Be Listed Below (If Applicable).     

## 2018-08-01 ENCOUNTER — Encounter: Payer: Self-pay | Admitting: Internal Medicine

## 2018-08-01 ENCOUNTER — Other Ambulatory Visit: Payer: Self-pay

## 2018-08-01 ENCOUNTER — Ambulatory Visit (INDEPENDENT_AMBULATORY_CARE_PROVIDER_SITE_OTHER): Payer: Medicare Other | Admitting: Internal Medicine

## 2018-08-01 ENCOUNTER — Ambulatory Visit (INDEPENDENT_AMBULATORY_CARE_PROVIDER_SITE_OTHER): Payer: Medicare Other

## 2018-08-01 DIAGNOSIS — R911 Solitary pulmonary nodule: Secondary | ICD-10-CM | POA: Diagnosis not present

## 2018-08-01 DIAGNOSIS — I2510 Atherosclerotic heart disease of native coronary artery without angina pectoris: Secondary | ICD-10-CM

## 2018-08-01 DIAGNOSIS — J449 Chronic obstructive pulmonary disease, unspecified: Secondary | ICD-10-CM | POA: Diagnosis not present

## 2018-08-01 NOTE — Patient Instructions (Signed)
Please remember to go to the  x-ray department  for your tests - we will call you with the results when they are available     Please schedule a follow up visit in 6  months but call sooner if needed

## 2018-08-01 NOTE — Progress Notes (Addendum)
Subjective:    Patient ID: Jacob Hampton, male   DOB: November 08, 1924    MRN: [id number]    Brief patient profile:  30  yowm quit smoking in 1977  referred to pulmonary clinic 05/20/2015 by Dr Wynonia Lawman for abn cxr    History of Present Illness  05/20/2015 1st [facility] office visit/ [name]   Chief Complaint  Patient presents with  . Pulmonary Consult    Referred by Dr. Tollie Eth for eval of abnormal cxr. Pt c/o DOE with minimal exertion and also unintended weight loss over the past few months.   doe x one month indolent onset/ progressive = MMRC3 = can't walk 100 yards even at a slow pace at a flat grade s stopping due to sob with gen weakness and wt loss - was having non-pleuritic L cp but this has resolved/ appetite poor s GI symptoms though has been having renal colic related to stones under eval by urology  rec PET done 05/27/15 isolated dz LLL       04/05/2016  f/u ov/[name] re:  spn LLL sup segment medially  Chief Complaint  Patient presents with  . Follow-up    Pt states overall doing well besides some left shoulder pain that started 2 wks ago.    ? Acute onset L post cp 24/7 no fluctuation/  not pleuritic/positional and no rash or problem using L arm  rec Please remember to go to the  x-ray department downstairs in the basement  for your tests - we will call you with the results when they are available. For pain tylenol or vicdin (norco) one every 4 hours as needed > only took a few        07/11/2017  f/u ov/[name] re: spn  Chief Complaint  Patient presents with  . Follow-up    weight loss about 20lbs since last ov.  Dyspnea:  Still going to mb and back but struggles a bit with doe back to house  Cough: no SABA use: none 02: no No cp / poor appeitite since kidney stone pain  but it has resolved and was not localized to L lower chest  On the other hand appetite has not recovered since pain resolved and not requiring analgesics now  Likes fried flounder but "my heart  doctor won't let me eat it due to my cholesterol" on lipitor 80  rec Consider a lower dose of lipitor per Dr Wynonia Lawman  Eat more fried fish and hush puppy's if that's what you like and ensure afterwards  Please schedule a follow up visit in 3 months but call sooner if needed with cxr on return     10/19/2017  f/u ov/[name] re: spn/ chronic orthostasis  Chief Complaint  Patient presents with  . Follow-up    gained 7 lbs since the last visit. He states he stays dizzy all the time.   Dyspnea:  Not limited by breathing from desired activities  / still mb/ still bowling/ still driving Cough: no cough Sleeping: flat bed/ one pillow SABA use: none 02: none Only dizzy if stands, never sitting or lying down   rec No change rx   08/01/2018  f/u ov/[name] re: spn/chronic orthostaisis  Chief Complaint  Patient presents with  . Follow-up    Breathing doing well.   Dyspnea:  Still doing mb and back to house, no longer mowing, no bowling due to covid Cough: none Sleeping: flat in bed  SABA use: no 02: no  No obvious day  to day or daytime variability or assoc excess/ purulent sputum or mucus plugs or hemoptysis or cp or chest tightness, subjective wheeze or overt sinus or hb symptoms.   Sleeping as above without nocturnal  or early am exacerbation  of respiratory  c/o's or need for noct saba. Also denies any obvious fluctuation of symptoms with weather or environmental changes or other aggravating or alleviating factors except as outlined above   No unusual exposure hx or h/o childhood pna/ asthma or knowledge of premature birth.  Current Allergies, Complete Past Medical History, Past Surgical History, Family History, and Social History were reviewed in [employer] record.  ROS  The following are not active complaints unless bolded Hoarseness, sore throat, dysphagia, dental problems, itching, sneezing,  nasal congestion or discharge of excess mucus or purulent secretions, ear  ache,   fever, chills, sweats, unintended wt loss or wt gain, classically pleuritic or exertional cp,  orthopnea pnd or arm/hand swelling  or leg swelling, presyncope, palpitations, abdominal pain, anorexia, nausea, vomiting, diarrhea  or change in bowel habits or change in bladder habits, change in stools or change in urine, dysuria, hematuria,  rash, arthralgias, visual complaints, headache, numbness, weakness or ataxia/dizzy spells  or problems with walking or coordination,  change in mood or  Memory.         Current Meds  Medication Sig  . aspirin EC 81 MG tablet Take 81 mg by mouth at bedtime.  Marland Kitchen atorvastatin (LIPITOR) 80 MG tablet Take 1 tablet (80 mg total) by mouth at bedtime.  . clopidogrel (PLAVIX) 75 MG tablet Take 1 tablet (75 mg total) by mouth at bedtime.  Marland Kitchen ezetimibe (ZETIA) 10 MG tablet Take 1 tablet (10 mg total) by mouth at bedtime.  . feeding supplement, ENSURE ENLIVE, (ENSURE ENLIVE) LIQD Take 237 mLs by mouth 2 (two) times daily between meals.  . folic acid (FOLVITE) 1 MG tablet TAKE 1 TABLET BY MOUTH AT BEDTIME  . nitroGLYCERIN (NITROSTAT) 0.4 MG SL tablet Place 0.4 mg under the tongue every 5 (five) minutes as needed. For chest pain              Objective:   Physical Exam  08/01/2018       122  10/19/2017        129  07/11/2017       123  07/07/2016       144  04/05/2016       145  10/05/2015       140  07/05/2015       139   05/20/15 142 lb 3.2 oz (64.501 kg)  05/09/15 143 lb 7 oz (65.063 kg)  05/20/11 156 lb 4.9 oz (70.9 kg)    Frail but pleasant elderly wm nad   HEENT: Edentulous/ nl turbinates bilaterally, and oropharynx. Nl external ear canals without cough reflex   NECK :  without JVD/Nodes/TM/ nl carotid upstrokes bilaterally   LUNGS: no acc muscle use,  Nl contour chest which is clear to A and P bilaterally without cough on insp or exp maneuvers   CV:  RRR  no s3 or murmur or increase in P2, and no edema   ABD:  soft and nontender with nl  inspiratory excursion in the supine position. No bruits or organomegaly appreciated, bowel sounds nl  MS:  Nl gait/ ext warm without deformities, calf tenderness, cyanosis or clubbing No obvious joint restrictions   SKIN: warm and dry without lesions    NEURO:  alert, approp, nl sensorium with  no motor or cerebellar deficits apparent.     CXR PA and Lateral:   08/01/2018 :    I personally reviewed images and agree with radiology impression as follows:   1. Vague medial left lung base opacity presumably corresponds to CT demonstrated lung mass. 2. Mild elevation of left diaphragm with basilar atelectasis or scarring         Assessment:

## 2018-08-02 ENCOUNTER — Encounter: Payer: Self-pay | Admitting: Internal Medicine

## 2018-08-02 NOTE — Assessment & Plan Note (Addendum)
Incidentally detected 05/09/15 x LLL medially x 77mm  PET 05/27/2015 >  1. Hypermetabolic nodule in the LEFT lower lobe consistent with primary bronchogenic carcinoma. 2. No evidence mediastinal nodal metastasis or distant metastasis  - see ov 07/05/2015 > declined surgical opinion  - 10/05/2015 not viz on plain cxr > neg cxr 04/05/2016  And 07/07/2016   -    CT renal 06/13/17  LLL nodule 2.6 x 2.1 with 1.0 cm satellite lesion  cxr 08/01/2018 no apparent change (poorly viz)   Remains asymptomatic with generalized geriactric decline since previous eval not attributable to a mass that cannot be clearly seen on plain cxr so given the risk/ benefit of intervention in this setting I still strongly favor serial cxr's and rx for symptoms should they evolve (cp/ hemoptysis )  Discussed in detail all the  indications, usual  risks and alternatives  relative to the benefits with patient who agrees to proceed with conservative f/u as outlined

## 2018-08-02 NOTE — Assessment & Plan Note (Signed)
Spirometry 07/05/2015  FEV1 1.61 (86%)  Ratio 60    Remains aysmptomatic and progressively less mobile s tendency to aecopd so no need to rx   I had an extended discussion with the patient reviewing all relevant studies completed to date and  lasting 15 to 20 minutes of a 25 minute visit    See device teaching which extended face to face time for this visit.  Each maintenance medication was reviewed in detail including emphasizing most importantly the difference between maintenance and prns and under what circumstances the prns are to be triggered using an action plan format that is not reflected in the computer generated alphabetically organized AVS which I have not found useful in most complex patients, especially with respiratory illnesses  Please see AVS for specific instructions unique to this visit that I personally wrote and verbalized to the the pt in detail and then reviewed with pt  by my nurse highlighting any  changes in therapy recommended at today's visit to their plan of care.

## 2018-10-24 ENCOUNTER — Ambulatory Visit: Payer: Medicare Other | Admitting: Cardiology

## 2018-10-28 ENCOUNTER — Other Ambulatory Visit: Payer: Self-pay | Admitting: Cardiology

## 2018-10-30 ENCOUNTER — Ambulatory Visit: Payer: Medicare Other | Admitting: Cardiology

## 2018-10-31 ENCOUNTER — Ambulatory Visit: Payer: Medicare Other | Admitting: Cardiology

## 2018-11-21 ENCOUNTER — Other Ambulatory Visit: Payer: Self-pay

## 2018-11-21 ENCOUNTER — Encounter: Payer: Self-pay | Admitting: Cardiology

## 2018-11-21 ENCOUNTER — Ambulatory Visit (INDEPENDENT_AMBULATORY_CARE_PROVIDER_SITE_OTHER): Payer: Medicare Other | Admitting: Cardiology

## 2018-11-21 VITALS — BP 110/60 | HR 102 | Ht 65.0 in | Wt 114.1 lb

## 2018-11-21 DIAGNOSIS — I483 Typical atrial flutter: Secondary | ICD-10-CM | POA: Diagnosis not present

## 2018-11-21 DIAGNOSIS — Z951 Presence of aortocoronary bypass graft: Secondary | ICD-10-CM | POA: Diagnosis not present

## 2018-11-21 DIAGNOSIS — I2510 Atherosclerotic heart disease of native coronary artery without angina pectoris: Secondary | ICD-10-CM | POA: Diagnosis not present

## 2018-11-21 DIAGNOSIS — Z8679 Personal history of other diseases of the circulatory system: Secondary | ICD-10-CM

## 2018-11-21 DIAGNOSIS — Z9889 Other specified postprocedural states: Secondary | ICD-10-CM | POA: Diagnosis not present

## 2018-11-21 NOTE — Addendum Note (Signed)
Addended by: Ashok Norris on: 11/21/2018 12:13 PM   Modules accepted: Orders

## 2018-11-21 NOTE — Patient Instructions (Signed)
Medication Instructions:  Your physician recommends that you continue on your current medications as directed. Please refer to the Current Medication list given to you today.  *If you need a refill on your cardiac medications before your next appointment, please call your pharmacy*  Lab Work: Your physician recommends that you return for lab work today: cmp, cbc    If you have labs (blood work) drawn today and your tests are completely normal, you will receive your results only by: Marland Kitchen MyChart Message (if you have MyChart) OR . A paper copy in the mail If you have any lab test that is abnormal or we need to change your treatment, we will call you to review the results.  Testing/Procedures: None.   Follow-Up: At [facility], you and your health needs are our priority.  As part of our continuing mission to provide you with exceptional heart care, we have created designated Provider Care Teams.  These Care Teams include your primary Cardiologist (physician) and Advanced Practice Providers (APPs -  Physician Assistants and Nurse Practitioners) who all work together to provide you with the care you need, when you need it.  Your next appointment:   3 months  The format for your next appointment:   In Person  Provider:   You may see Dr. Agustin Cree  or the following Advanced Practice Provider on your designated Care Team:    Laurann [place], FNP   Other Instructions

## 2018-11-21 NOTE — Progress Notes (Signed)
Cardiology Office Note:    Date:  11/21/2018   ID:  [name], DOB [birth year], MRN [id number]  PCP:  Patient, No Pcp Per  Cardiologist:  Jenne Campus, MD    Referring MD: No ref. provider found   Chief Complaint  Patient presents with  . Follow-up  Am weak and tired  History of Present Illness:    Jacob Hampton is a 85 y.o. male complex past medical history which includes atrial flutter status post atrial flutter ablation, history of GI bleed requiring blood transfusion, coronary disease status post coronary bypass graft.  Comes today to my office for follow-up overall he is complaining of being weak tired and exhausted.  He comes to me with his son his son complain that he is sitting on the chair all the time does not want to do much.  Before coronavirus bandemia he was doing bowling and he enjoyed a lot but now because of coronavirus he does not do it.  Gradually he is deteriorating.  Denies have any chest pain, tightness, pressure, burning in the chest, no swelling of lower extremities.  He complained of being tired all the time  Past Medical History:  Diagnosis Date  . Anemia   . Angina   . Arthritis   . Atrial flutter ([place])   . Blood transfusion    Had 8 units for GI bleed  . CAD (coronary artery disease)    Dr. Wynonia Lawman is his Cardiologist  . Dizzy    "when I take my blood pressure medicine sometimes it makes me a little dizzy"  . GERD (gastroesophageal reflux disease)   . Heart murmur   . History of primary tuberculosis   . History of upper GI bleeding    8 units for GI bleed   . Hyperlipidemia   . Hypertension   . Kidney stones   . Malignant neoplasm of upper lobe, unspecified bronchus or lung ([place])   . Pneumonia    h/o walking pneumonia at least 20 years ago  . Presence of aortocoronary bypass graft   . Presence of coronary angioplasty implant and graft   . Shortness of breath 05/18/11   "sometimes lying down; sometimes w/activity"  . Sinus drainage    11/24/10 "every morning when I get up I have to clear it out"  . Sleep disorder    "trouble staying asleep"  . Tuberculosis 1969   Was in sanitorium for 1 year  . Ventricular premature depolarization     Past Surgical History:  Procedure Laterality Date  . A-FLUTTER ABLATION N/A 09/12/2017   Procedure: A-FLUTTER ABLATION;  Surgeon: Evans Lance, MD;  Location: [place] [facility];  Service: Cardiovascular;  Laterality: N/A;  . APPENDECTOMY    . bleeding ulcer     w/gallbladder  . CATARACT EXTRACTION W/ INTRAOCULAR LENS IMPLANT     bilaterally  . CHOLECYSTECTOMY     "w/bleeding ulcer OR"  . CORONARY ANGIOPLASTY WITH STENT PLACEMENT  02/2011  . CORONARY ARTERY BYPASS GRAFT  03/25/1999   CABG x5 using LIMA to LAD, SVG to Diag, SVG to OM, sequential SVG to PD and RPL, open SV harvest from left thigh and leg  . CORONARY ARTERY BYPASS GRAFT  11/22/2010   Procedure: REDO CORONARY ARTERY BYPASS GRAFTING (CABG)TIMES TWO;  Surgeon: Rexene Alberts, MD;  Location: [place];  Service: Open Heart Surgery;  Laterality: N/A;  using endoscopically harvested right saphenous vein and left radial artery; Transesophageal echocardiogram  . CYSTOSCOPY W/ URETERAL STENT  PLACEMENT Left 12/29/2017   Procedure: CYSTOSCOPY WITH RETROGRADE PYELOGRAM/URETERAL STENT EXCHANGE;  Surgeon: Irine Seal, MD;  [facility];  Service: Urology;  Laterality: Left;  . INGUINAL HERNIA REPAIR     ? left  . LEFT HEART CATHETERIZATION WITH CORONARY/GRAFT ANGIOGRAM N/A 03/01/2011   Procedure: LEFT HEART CATHETERIZATION WITH Beatrix Fetters;  Surgeon: Jettie Booze, MD;  [facility];  Service: Cardiovascular;  Laterality: N/A;  . LEFT HEART CATHETERIZATION WITH CORONARY/GRAFT ANGIOGRAM N/A 05/19/2011   Procedure: LEFT HEART CATHETERIZATION WITH Beatrix Fetters;  Surgeon: Jettie Booze, MD;  Location: [facility] CATH LAB;  Service: Cardiovascular;  Laterality: N/A;  . LITHOTRIPSY    . OTHER SURGICAL  HISTORY  03/25/1999   reexploration for bleeding s/p CABG - coagulopathy without mechanical source  . PARTIAL GASTRECTOMY    . PERCUTANEOUS CORONARY STENT INTERVENTION (PCI-S) N/A 03/02/2011   Procedure: PERCUTANEOUS CORONARY STENT INTERVENTION (PCI-S);  Surgeon: Jettie Booze, MD;  Location: [facility] CATH LAB;  Service: Cardiovascular;  Laterality: N/A;  . PERCUTANEOUS CORONARY STENT INTERVENTION (PCI-S)  05/19/2011   Procedure: PERCUTANEOUS CORONARY STENT INTERVENTION (PCI-S);  Surgeon: Jettie Booze, MD;  Location: [facility] CATH LAB;  Service: Cardiovascular;;  . RADIAL ARTERY HARVEST  11/22/2010   Procedure: RADIAL ARTERY HARVEST;  Surgeon: Rexene Alberts, MD;  Location: [place];  Service: Open Heart Surgery;  Laterality: Left;  . Redo CABG  11/22/2010   CABG x2 with left radial to OM and SVG to RCA  . Right testicle surgery      Current Medications: Current Meds  Medication Sig  . aspirin EC 81 MG tablet Take 81 mg by mouth at bedtime.  Marland Kitchen atorvastatin (LIPITOR) 80 MG tablet Take 1 tablet (80 mg total) by mouth at bedtime.  . clopidogrel (PLAVIX) 75 MG tablet Take 1 tablet (75 mg total) by mouth at bedtime.  Marland Kitchen ezetimibe (ZETIA) 10 MG tablet TAKE 1 TABLET BY MOUTH AT BEDTIME  . feeding supplement, ENSURE ENLIVE, (ENSURE ENLIVE) LIQD Take 237 mLs by mouth 2 (two) times daily between meals.  . folic acid (FOLVITE) 1 MG tablet TAKE 1 TABLET BY MOUTH AT BEDTIME  . nitroGLYCERIN (NITROSTAT) 0.4 MG SL tablet Place 0.4 mg under the tongue every 5 (five) minutes as needed. For chest pain     Allergies:   Patient has no known allergies.   Social History   Socioeconomic History  . Marital status: Widowed    Spouse name: Not on file  . Number of children: Not on file  . Years of education: Not on file  . Highest education level: Not on file  Occupational History  . Not on file  Social Needs  . Financial resource strain: Not on file  . Food insecurity    Worry: Not on file    Inability:  Not on file  . Transportation needs    Medical: Not on file    Non-medical: Not on file  Tobacco Use  . Smoking status: Former Smoker    Packs/day: 0.25    Years: 25.00    Pack years: 6.25    Types: Cigarettes    Quit date: 01/17/1975    Years since quitting: 43.8  . Smokeless tobacco: Never Used  Substance and Sexual Activity  . Alcohol use: No    Alcohol/week: 0.0 standard drinks  . Drug use: No  . Sexual activity: Not on file  Lifestyle  . Physical activity    Days per week: Not on file  Minutes per session: Not on file  . Stress: Not on file  Relationships  . Social Herbalist on phone: Not on file    Gets together: Not on file    Attends religious service: Not on file    Active member of club or organization: Not on file    Attends meetings of clubs or organizations: Not on file    Relationship status: Not on file  Other Topics Concern  . Not on file  Social History Narrative   Retired [employer], 2 sons, 2 daughters   Bowls regularly.     Family History: The patient's family history includes Alzheimer's disease in his sister; Bowel Disease in his brother; Cancer in his brother and father; Heart attack in his sister; Stroke in his brother; Unexplained death in his brother and mother. ROS:   Please see the history of present illness.    All 14 point review of systems negative except as described per history of present illness  EKGs/Labs/Other Studies Reviewed:      Recent Labs: 12/30/2017: ALT 30; Magnesium 2.7; TSH 0.887 12/31/2017: BUN 31; Creatinine, Ser 1.19; Hemoglobin 8.5; Platelets 132; Potassium 4.2; Sodium 136  Recent Lipid Panel    Component Value Date/Time   CHOL 117 03/01/2011 0531   TRIG 68 03/01/2011 0531   HDL 42 03/01/2011 0531   CHOLHDL 2.8 03/01/2011 0531   VLDL 14 03/01/2011 0531   LDLCALC 61 03/01/2011 0531    Physical Exam:    VS:  BP 110/60   Pulse (!) 102   Ht 5\' 5"  (1.651 m)   Wt 114 lb 1.9  oz (51.8 kg)   SpO2 90%   BMI 18.99 kg/m     Wt Readings from Last 3 Encounters:  11/21/18 114 lb 1.9 oz (51.8 kg)  08/01/18 122 lb 3.2 oz (55.4 kg)  03/08/18 129 lb 6.4 oz (58.7 kg)     GEN:  Well nourished, well developed in no acute distress HEENT: Normal NECK: No JVD; No carotid bruits LYMPHATICS: No lymphadenopathy CARDIAC: RRR, no murmurs, no rubs, no gallops RESPIRATORY:  Clear to auscultation without rales, wheezing or rhonchi  ABDOMEN: Soft, non-tender, non-distended MUSCULOSKELETAL:  No edema; No deformity  SKIN: Warm and dry LOWER EXTREMITIES: no swelling NEUROLOGIC:  Alert and oriented x 3 PSYCHIATRIC:  Normal affect   ASSESSMENT:    1. Coronary artery disease involving native coronary artery of native heart without angina pectoris   2. Typical atrial flutter ([place])   3. History of CABG   4. Status post ablation of atrial flutter summer 2019    PLAN:    In order of problems listed above:  1. Coronary disease stable on appropriate medications which I will continue 2. Typical atrial flutter.  We will do EKG today to check the rhythm.  Status post atrial flutter ablation, not anticoagulated because of history of GI bleed 3. Weakness fatigue I will check CBC as well as complete metabolic panel. 4. Status post coronary bypass graft.  Noted 5. History of anemia requiring blood transfusion I will check his CBC today   Medication Adjustments/Labs and Tests Ordered: Current medicines are reviewed at length with the patient today.  Concerns regarding medicines are outlined above.  No orders of the defined types were placed in this encounter.  Medication changes: No orders of the defined types were placed in this encounter.   Signed, Park Liter, MD, [facility] 11/21/2018 12:03 PM    [facility]

## 2018-11-22 LAB — COMPREHENSIVE METABOLIC PANEL
ALT: 20 IU/L (ref 0–44)
AST: 31 IU/L (ref 0–40)
Albumin/Globulin Ratio: 1.2 (ref 1.2–2.2)
Albumin: 3.8 g/dL (ref 3.5–4.6)
Alkaline Phosphatase: 102 IU/L (ref 39–117)
BUN/Creatinine Ratio: 25 — ABNORMAL HIGH (ref 10–24)
BUN: 25 mg/dL (ref 10–36)
Bilirubin Total: 1.2 mg/dL (ref 0.0–1.2)
CO2: 27 mmol/L (ref 20–29)
Calcium: 10 mg/dL (ref 8.6–10.2)
Chloride: 102 mmol/L (ref 96–106)
Creatinine, Ser: 1.02 mg/dL (ref 0.76–1.27)
GFR calc Af Amer: 73 mL/min/{1.73_m2} (ref >59)
GFR calc non Af Amer: 64 mL/min/{1.73_m2} (ref >59)
Globulin, Total: 3.2 g/dL (ref 1.5–4.5)
Glucose: 100 mg/dL — ABNORMAL HIGH (ref 65–99)
Potassium: 3.9 mmol/L (ref 3.5–5.2)
Sodium: 142 mmol/L (ref 134–144)
Total Protein: 7 g/dL (ref 6.0–8.5)

## 2018-11-22 LAB — CBC
Hematocrit: 38.3 % (ref 37.5–51.0)
Hemoglobin: 12.7 g/dL — ABNORMAL LOW (ref 13.0–17.7)
MCH: 32.7 pg (ref 26.6–33.0)
MCHC: 33.2 g/dL (ref 31.5–35.7)
MCV: 99 fL — ABNORMAL HIGH (ref 79–97)
Platelets: 251 10*3/uL (ref 150–450)
RBC: 3.88 x10E6/uL — ABNORMAL LOW (ref 4.14–5.80)
RDW: 13.8 % (ref 11.6–15.4)
WBC: 9.1 10*3/uL (ref 3.4–10.8)

## 2018-11-23 ENCOUNTER — Other Ambulatory Visit: Payer: Self-pay | Admitting: Cardiology

## 2018-11-23 DIAGNOSIS — I2510 Atherosclerotic heart disease of native coronary artery without angina pectoris: Secondary | ICD-10-CM

## 2018-11-23 DIAGNOSIS — I483 Typical atrial flutter: Secondary | ICD-10-CM

## 2018-11-26 ENCOUNTER — Other Ambulatory Visit: Payer: Self-pay

## 2018-11-26 NOTE — Telephone Encounter (Signed)
Plavix and Folic Acid refills sent to [facility] in [place]

## 2018-12-17 ENCOUNTER — Other Ambulatory Visit: Payer: Self-pay

## 2018-12-17 ENCOUNTER — Emergency Department (HOSPITAL_BASED_OUTPATIENT_CLINIC_OR_DEPARTMENT_OTHER): Payer: Medicare Other

## 2018-12-17 ENCOUNTER — Encounter (HOSPITAL_BASED_OUTPATIENT_CLINIC_OR_DEPARTMENT_OTHER): Payer: Self-pay | Admitting: Emergency Medicine

## 2018-12-17 ENCOUNTER — Emergency Department (HOSPITAL_BASED_OUTPATIENT_CLINIC_OR_DEPARTMENT_OTHER)
Admission: EM | Admit: 2018-12-17 | Discharge: 2018-12-17 | Disposition: A | Discharge status: Home / Self Care | Payer: Medicare Other | Attending: Emergency Medicine | Admitting: Emergency Medicine

## 2018-12-17 DIAGNOSIS — R5381 Other malaise: Secondary | ICD-10-CM | POA: Insufficient documentation

## 2018-12-17 DIAGNOSIS — E860 Dehydration: Secondary | ICD-10-CM | POA: Diagnosis not present

## 2018-12-17 DIAGNOSIS — Z85118 Personal history of other malignant neoplasm of bronchus and lung: Secondary | ICD-10-CM | POA: Insufficient documentation

## 2018-12-17 DIAGNOSIS — Z951 Presence of aortocoronary bypass graft: Secondary | ICD-10-CM | POA: Insufficient documentation

## 2018-12-17 DIAGNOSIS — R634 Abnormal weight loss: Secondary | ICD-10-CM | POA: Insufficient documentation

## 2018-12-17 DIAGNOSIS — R42 Dizziness and giddiness: Secondary | ICD-10-CM | POA: Diagnosis present

## 2018-12-17 DIAGNOSIS — I10 Essential (primary) hypertension: Secondary | ICD-10-CM | POA: Insufficient documentation

## 2018-12-17 DIAGNOSIS — I25119 Atherosclerotic heart disease of native coronary artery with unspecified angina pectoris: Secondary | ICD-10-CM | POA: Insufficient documentation

## 2018-12-17 DIAGNOSIS — Z955 Presence of coronary angioplasty implant and graft: Secondary | ICD-10-CM | POA: Diagnosis not present

## 2018-12-17 DIAGNOSIS — Z87891 Personal history of nicotine dependence: Secondary | ICD-10-CM | POA: Diagnosis not present

## 2018-12-17 LAB — CBC
HCT: 39.4 % (ref 39.0–52.0)
Hemoglobin: 12.4 g/dL — ABNORMAL LOW (ref 13.0–17.0)
MCH: 33 pg (ref 26.0–34.0)
MCHC: 31.5 g/dL (ref 30.0–36.0)
MCV: 104.8 fL — ABNORMAL HIGH (ref 80.0–100.0)
Platelets: 242 10*3/uL (ref 150–400)
RBC: 3.76 MIL/uL — ABNORMAL LOW (ref 4.22–5.81)
RDW: 15.1 % (ref 11.5–15.5)
WBC: 9.3 10*3/uL (ref 4.0–10.5)
nRBC: 0 % (ref 0.0–0.2)

## 2018-12-17 LAB — COMPREHENSIVE METABOLIC PANEL
ALT: 18 U/L (ref 0–44)
AST: 30 U/L (ref 15–41)
Albumin: 3.3 g/dL — ABNORMAL LOW (ref 3.5–5.0)
Alkaline Phosphatase: 76 U/L (ref 38–126)
Anion gap: 6 (ref 5–15)
BUN: 25 mg/dL — ABNORMAL HIGH (ref 8–23)
CO2: 29 mmol/L (ref 22–32)
Calcium: 9.7 mg/dL (ref 8.9–10.3)
Chloride: 101 mmol/L (ref 98–111)
Creatinine, Ser: 0.97 mg/dL (ref 0.61–1.24)
GFR calc Af Amer: >60 mL/min (ref >60)
GFR calc non Af Amer: >60 mL/min (ref >60)
Glucose, Bld: 139 mg/dL — ABNORMAL HIGH (ref 70–99)
Potassium: 3.5 mmol/L (ref 3.5–5.1)
Sodium: 136 mmol/L (ref 135–145)
Total Bilirubin: 1.3 mg/dL — ABNORMAL HIGH (ref 0.3–1.2)
Total Protein: 7 g/dL (ref 6.5–8.1)

## 2018-12-17 MED ORDER — SODIUM CHLORIDE 0.9 % IV BOLUS
500.0000 mL | Freq: Once | INTRAVENOUS | Status: AC
  Administered 2018-12-17: 500 mL via INTRAVENOUS

## 2018-12-17 NOTE — ED Triage Notes (Signed)
Pt c/o dizziness for a long time. Pt son states " he is low on fluids". Reports decreased PO intake for several weeks. Denies pain.

## 2018-12-17 NOTE — Discharge Instructions (Addendum)
You were evaluated in the Emergency Department and after careful evaluation, we did not find any emergent condition requiring admission or further testing in the hospital.  Please return to the Emergency Department if you experience any worsening of your condition.  We encourage you to follow up with a primary care provider and consider home physical therapy.  Thank you for allowing [place] to be a part of your care.

## 2018-12-17 NOTE — ED Notes (Signed)
ED Provider at bedside. 

## 2018-12-17 NOTE — ED Notes (Signed)
XR at bedside

## 2018-12-17 NOTE — ED Provider Notes (Signed)
[facility] Emergency Department Provider Note MRN:  [id number]  Arrival date & time: 12/17/18     Chief Complaint   Dizziness   History of Present Illness   Jacob Hampton is a 85 y.o. year-old male with a history of atrial flutter, CAD presenting to the ED with chief complaint of dizziness.  1 year of dizziness, worse with ambulating or standing up.  More recently with decreased p.o. intake for the past several weeks.  Losing weight.  Denies fever, no cough, no chest pain, no shortness of breath, no abdominal pain, no dysuria.  Symptoms constant, no exacerbating or alleviating factors.  Review of Systems  A complete 10 system review of systems was obtained and all systems are negative except as noted in the HPI and PMH.   Patient's Health History    Past Medical History:  Diagnosis Date   Anemia    Angina    Arthritis    Atrial flutter ([place])    Blood transfusion    Had 8 units for GI bleed   CAD (coronary artery disease)    Dr. Wynonia Lawman is his Cardiologist   Dizzy    "when I take my blood pressure medicine sometimes it makes me a little dizzy"   GERD (gastroesophageal reflux disease)    Heart murmur    History of primary tuberculosis    History of upper GI bleeding    8 units for GI bleed    Hyperlipidemia    Hypertension    Kidney stones    Malignant neoplasm of upper lobe, unspecified bronchus or lung ([facility])    Pneumonia    h/o walking pneumonia at least 20 years ago   Presence of aortocoronary bypass graft    Presence of coronary angioplasty implant and graft    Shortness of breath 05/18/11   "sometimes lying down; sometimes w/activity"   Sinus drainage    11/24/10 "every morning when I get up I have to clear it out"   Sleep disorder    "trouble staying asleep"   Tuberculosis 1969   Was in sanitorium for 1 year   Ventricular premature depolarization     Past Surgical History:  Procedure Laterality Date    A-FLUTTER ABLATION N/A 09/12/2017   Procedure: A-FLUTTER ABLATION;  Surgeon: Evans Lance, MD;  Location: [place] [facility];  Service: Cardiovascular;  Laterality: N/A;   APPENDECTOMY     bleeding ulcer     w/gallbladder   CATARACT EXTRACTION W/ INTRAOCULAR LENS IMPLANT     bilaterally   CHOLECYSTECTOMY     "w/bleeding ulcer OR"   CORONARY ANGIOPLASTY WITH STENT PLACEMENT  02/2011   CORONARY ARTERY BYPASS GRAFT  03/25/1999   CABG x5 using LIMA to LAD, SVG to Diag, SVG to OM, sequential SVG to PD and RPL, open SV harvest from left thigh and leg   CORONARY ARTERY BYPASS GRAFT  11/22/2010   Procedure: REDO CORONARY ARTERY BYPASS GRAFTING (CABG)TIMES TWO;  Surgeon: Rexene Alberts, MD;  Location: [place];  Service: Open Heart Surgery;  Laterality: N/A;  using endoscopically harvested right saphenous vein and left radial artery; Transesophageal echocardiogram   CYSTOSCOPY W/ URETERAL STENT PLACEMENT Left 12/29/2017   Procedure: CYSTOSCOPY WITH RETROGRADE PYELOGRAM/URETERAL STENT EXCHANGE;  Surgeon: Irine Seal, MD;  [facility];  Service: Urology;  Laterality: Left;   INGUINAL HERNIA REPAIR     ? left   LEFT HEART CATHETERIZATION WITH CORONARY/GRAFT ANGIOGRAM N/A 03/01/2011   Procedure: LEFT HEART  CATHETERIZATION WITH Beatrix Fetters;  Surgeon: Jettie Booze, MD;  Location: [facility] CATH LAB;  Service: Cardiovascular;  Laterality: N/A;   LEFT HEART CATHETERIZATION WITH CORONARY/GRAFT ANGIOGRAM N/A 05/19/2011   Procedure: LEFT HEART CATHETERIZATION WITH Beatrix Fetters;  Surgeon: Jettie Booze, MD;  Location: [facility] CATH LAB;  Service: Cardiovascular;  Laterality: N/A;   LITHOTRIPSY     OTHER SURGICAL HISTORY  03/25/1999   reexploration for bleeding s/p CABG - coagulopathy without mechanical source   PARTIAL GASTRECTOMY     PERCUTANEOUS CORONARY STENT INTERVENTION (PCI-S) N/A 03/02/2011   Procedure: PERCUTANEOUS CORONARY STENT INTERVENTION (PCI-S);  Surgeon:  Jettie Booze, MD;  Location: [facility] CATH LAB;  Service: Cardiovascular;  Laterality: N/A;   PERCUTANEOUS CORONARY STENT INTERVENTION (PCI-S)  05/19/2011   Procedure: PERCUTANEOUS CORONARY STENT INTERVENTION (PCI-S);  Surgeon: Jettie Booze, MD;  Location: [facility] CATH LAB;  Service: Cardiovascular;;   RADIAL ARTERY HARVEST  11/22/2010   Procedure: RADIAL ARTERY HARVEST;  Surgeon: Rexene Alberts, MD;  Location: [place];  Service: Open Heart Surgery;  Laterality: Left;   Redo CABG  11/22/2010   CABG x2 with left radial to OM and SVG to RCA   Right testicle surgery      Family History  Problem Relation Age of Onset   Cancer Father    Unexplained death Mother    Unexplained death Brother    Bowel Disease Brother    Alzheimer's disease Sister    Cancer Brother    Stroke Brother    Heart attack Sister     Social History   Socioeconomic History   Marital status: Widowed    Spouse name: Not on file   Number of children: Not on file   Years of education: Not on file   Highest education level: Not on file  Occupational History   Not on file  Social Needs   Financial resource strain: Not on file   Food insecurity    Worry: Not on file    Inability: Not on file   Transportation needs    Medical: Not on file    Non-medical: Not on file  Tobacco Use   Smoking status: Former Smoker    Packs/day: 0.25    Years: 25.00    Pack years: 6.25    Types: Cigarettes    Quit date: 01/17/1975    Years since quitting: 43.9   Smokeless tobacco: Never Used  Substance and Sexual Activity   Alcohol use: No    Alcohol/week: 0.0 standard drinks   Drug use: No   Sexual activity: Not on file  Lifestyle   Physical activity    Days per week: Not on file    Minutes per session: Not on file   Stress: Not on file  Relationships   Social connections    Talks on phone: Not on file    Gets together: Not on file    Attends religious service: Not on file    Active member of  club or organization: Not on file    Attends meetings of clubs or organizations: Not on file    Relationship status: Not on file   Intimate partner violence    Fear of current or ex partner: Not on file    Emotionally abused: Not on file    Physically abused: Not on file    Forced sexual activity: Not on file  Other Topics Concern   Not on file  Social History Narrative   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     Physical Exam  Vital Signs and Nursing Notes reviewed Vitals:   12/17/18 1030 12/17/18 1100  BP: 97/62 101/69  Pulse: 75 68  Resp: 19 19  Temp:    SpO2: 99% 99%    CONSTITUTIONAL: Well-appearing, NAD, thin NEURO:  Alert and oriented x 3, no focal deficits EYES:  eyes equal and reactive ENT/NECK:  no LAD, no JVD CARDIO: Regular rate, well-perfused, normal S1 and S2 PULM:  CTAB no wheezing or rhonchi GI/GU:  normal bowel sounds, non-distended, non-tender MSK/SPINE:  No gross deformities, no edema SKIN:  no rash, atraumatic PSYCH:  Appropriate speech and behavior  Diagnostic and Interventional Summary    EKG Interpretation  Date/Time:  Tuesday December 17 2018 10:01:32 EST Ventricular Rate:  91 PR Interval:    QRS Duration: 122 QT Interval:  376 QTC Calculation: 463 R Axis:   78 Text Interpretation: Sinus rhythm Ventricular premature complex Right bundle branch block No significant change was found Confirmed by Gerlene Fee [phone]) on 12/17/2018 10:46:51 AM      Labs Reviewed  CBC - Abnormal; Notable for the following components:      Result Value   RBC 3.76 (*)    Hemoglobin 12.4 (*)    MCV 104.8 (*)    All other components within normal limits  COMPREHENSIVE METABOLIC PANEL - Abnormal; Notable for the following components:   Glucose, Bld 139 (*)    BUN 25 (*)    Albumin 3.3 (*)    Total Bilirubin 1.3 (*)    All other components within normal limits    XR Chest Single View  Final Result        Medications  sodium chloride 0.9 % bolus 500 mL (500 mLs Intravenous New Bag/Given 12/17/18 1014)     Procedures  /  Critical Care Procedures  ED Course and Medical Decision Making  I have reviewed the triage vital signs and the nursing notes.  Pertinent labs & imaging results that were available during my care of the patient were reviewed by me and considered in my medical decision making (see below for details).     Suspect dehydration given the decreased p.o. intake.  In general patient is presenting with chronic medical issues.  Benign abdomen.  Considering pneumonia, UTI, metabolic disarray, work-up pending.  Labs are very reassuring, patient continues to have normal vital signs.  He has no fever, no subjective fever, no abdominal pain, no suprapubic tenderness, no dysuria.  Therefore urinalysis unnecessary today.  Patient will follow up with primary care doctor to discuss his chronic symptoms.  Jacob Hampton, [place] [email or web address]  Final Clinical Impressions(s) / ED Diagnoses     ICD-10-CM   1. Weight loss  R63.4   2. Dizzy  R42 XR Chest Single View    XR Chest Single View  3. Physical deconditioning  R53.81   4. Dehydration  E86.0     ED Discharge Orders    None       Discharge Instructions Discussed with and Provided to Patient:     Discharge Instructions     You were evaluated in the Emergency Department and after careful evaluation, we did not find any emergent condition requiring admission or further testing in the hospital.  Please return to the Emergency Department if you experience any worsening of your condition.  We encourage you to follow up with a primary  care provider and consider home physical therapy.  Thank you for allowing [place] to be a part of your care.        Maudie Flakes, MD 12/17/18 1120

## 2018-12-17 NOTE — ED Notes (Signed)
Pt was given a urinal to provide urine and a sprite to drink.

## 2019-02-03 ENCOUNTER — Ambulatory Visit: Payer: Medicare Other | Admitting: Internal Medicine

## 2019-02-06 ENCOUNTER — Ambulatory Visit: Payer: Medicare Other | Admitting: Internal Medicine

## 2019-02-07 ENCOUNTER — Ambulatory Visit: Payer: Medicare Other | Admitting: Internal Medicine

## 2019-02-10 ENCOUNTER — Telehealth: Payer: Self-pay | Admitting: Cardiology

## 2019-02-10 ENCOUNTER — Other Ambulatory Visit: Payer: Self-pay

## 2019-02-10 MED ORDER — EZETIMIBE 10 MG PO TABS: 10.0000 mg | ORAL_TABLET | Freq: Every day | ORAL | 1 refills | Status: AC

## 2019-02-10 MED ORDER — ATORVASTATIN CALCIUM 80 MG PO TABS: 80.0000 mg | ORAL_TABLET | Freq: Every day | ORAL | 1 refills | Status: AC

## 2019-02-10 MED ORDER — CLOPIDOGREL BISULFATE 75 MG PO TABS: 75.0000 mg | ORAL_TABLET | Freq: Every day | ORAL | 1 refills | Status: AC

## 2019-02-10 NOTE — Telephone Encounter (Signed)
Rx's sent to pharmacy.  

## 2019-02-10 NOTE — Progress Notes (Signed)
Sent rx refills for Lipitor 80 MG, Plavix 75 MG, and Zetia 10 MG.

## 2019-02-10 NOTE — Telephone Encounter (Signed)
*  STAT* If patient is at the pharmacy, call can be transferred to refill team.   1. Which medications need to be refilled? (please list name of each medication and dose if known)  atorvastatin (LIPITOR) 80 MG tablet clopidogrel (PLAVIX) 75 MG tablet ezetimibe (ZETIA) 10 MG tablet  2. Which pharmacy/location (including street and city if local pharmacy) is medication to be sent to?  [facility], [place] - [address]  3. Do they need a 30 day or 90 day supply? 90 with refills  Family of the pt is quarantined and will not be able to bring the pt to his appointment until the quarantine is lifted. The appt has been rescheduled for 02-29-21.  The patient will be out of medicine by the end of the week.

## 2019-02-14 ENCOUNTER — Ambulatory Visit: Payer: Medicare Other | Admitting: Cardiology

## 2019-03-05 ENCOUNTER — Other Ambulatory Visit: Payer: Self-pay | Admitting: Cardiology

## 2019-03-05 ENCOUNTER — Telehealth: Payer: Self-pay | Admitting: Cardiology

## 2019-03-05 DIAGNOSIS — I483 Typical atrial flutter: Secondary | ICD-10-CM

## 2019-03-05 DIAGNOSIS — I2510 Atherosclerotic heart disease of native coronary artery without angina pectoris: Secondary | ICD-10-CM

## 2019-03-05 NOTE — Telephone Encounter (Signed)
Patient's wife is calling to follow up in regards to requesting a 90 day refill supply for clopidogrel (PLAVIX) 75 MG tablet medication. She states the pharmacy is requesting a prior authorization from Dr. Agustin Cree in order to refill the medication. She states that the pharmacy never refilled prescription order from 02/10/19. She also states the patient has 5 tablets remaining.

## 2019-03-05 NOTE — Telephone Encounter (Signed)
*  STAT* If patient is at the pharmacy, call can be transferred to refill team.   1. Which medications need to be refilled? (please list name of each medication and dose if known) folic acid (FOLVITE) 1 MG tablet  2. Which pharmacy/location (including street and city if local pharmacy) is medication to be sent to? [facility], [place] - [address]  3. Do they need a 30 day or 90 day supply? 90 day supply   Patient's wife states the patient only has 3 tablets remaining.

## 2019-03-07 NOTE — Telephone Encounter (Signed)
Please find out what happened in the hospital, I was in the hospital was a GI bleader

## 2019-03-10 NOTE — Telephone Encounter (Signed)
Called spoke to patient family member on dpr. She reports that he had a GI bleed but that was a long time ago and he has already picked up his plavix. They have no further concerns. They will be at his follow up appointment on Friday.

## 2019-03-14 ENCOUNTER — Telehealth: Payer: Medicare Other | Admitting: Cardiology

## 2019-03-14 ENCOUNTER — Encounter: Payer: Self-pay | Admitting: Cardiology

## 2019-03-14 ENCOUNTER — Telehealth: Payer: Self-pay | Admitting: Emergency Medicine

## 2019-03-14 ENCOUNTER — Other Ambulatory Visit: Payer: Self-pay

## 2019-03-14 NOTE — Telephone Encounter (Signed)
Called patient daughter in law back, she reported she was with patient now and I informed her I was starting virtual visit, went over history and medications then asked patient if she would like video or telephone for the patient's visit she then asked if we could switch to another day and patient could be seen in the office. Cancelled appointment for today and rescheduled patient. Dr. Agustin Cree aware.

## 2019-03-14 NOTE — Telephone Encounter (Signed)
Called for patient's virtual appointment. Spoke to [place], daughter in Sports coach per dpr. She reports patient is on the way to [place] for appointment. Informed her this was virtual appointment and notes state patient was informed. She thinks patient may have gotten confused. She is trying to reach her husband who is with him, I have tried to call him however no answer. I told her to have them call when she gets ahold of them and we will go ahead with virtual visit.

## 2019-03-24 ENCOUNTER — Emergency Department (HOSPITAL_BASED_OUTPATIENT_CLINIC_OR_DEPARTMENT_OTHER): Payer: Medicare Other

## 2019-03-24 ENCOUNTER — Encounter (HOSPITAL_BASED_OUTPATIENT_CLINIC_OR_DEPARTMENT_OTHER): Payer: Self-pay | Admitting: *Deleted

## 2019-03-24 ENCOUNTER — Other Ambulatory Visit: Payer: Self-pay

## 2019-03-24 ENCOUNTER — Inpatient Hospital Stay (HOSPITAL_BASED_OUTPATIENT_CLINIC_OR_DEPARTMENT_OTHER)
Admission: EM | Admit: 2019-03-24 | Discharge: 2019-03-28 | DRG: 199 | Disposition: A | Discharge status: Home Health | Payer: Medicare Other | Attending: Internal Medicine | Admitting: Internal Medicine

## 2019-03-24 DIAGNOSIS — Z8249 Family history of ischemic heart disease and other diseases of the circulatory system: Secondary | ICD-10-CM

## 2019-03-24 DIAGNOSIS — E785 Hyperlipidemia, unspecified: Secondary | ICD-10-CM | POA: Diagnosis present

## 2019-03-24 DIAGNOSIS — R64 Cachexia: Secondary | ICD-10-CM | POA: Diagnosis present

## 2019-03-24 DIAGNOSIS — I483 Typical atrial flutter: Secondary | ICD-10-CM

## 2019-03-24 DIAGNOSIS — Z951 Presence of aortocoronary bypass graft: Secondary | ICD-10-CM

## 2019-03-24 DIAGNOSIS — E876 Hypokalemia: Secondary | ICD-10-CM | POA: Diagnosis not present

## 2019-03-24 DIAGNOSIS — Z7902 Long term (current) use of antithrombotics/antiplatelets: Secondary | ICD-10-CM

## 2019-03-24 DIAGNOSIS — N182 Chronic kidney disease, stage 2 (mild): Secondary | ICD-10-CM | POA: Diagnosis present

## 2019-03-24 DIAGNOSIS — Z823 Family history of stroke: Secondary | ICD-10-CM | POA: Diagnosis not present

## 2019-03-24 DIAGNOSIS — Z681 Body mass index (BMI) 19 or less, adult: Secondary | ICD-10-CM

## 2019-03-24 DIAGNOSIS — M1990 Unspecified osteoarthritis, unspecified site: Secondary | ICD-10-CM | POA: Diagnosis present

## 2019-03-24 DIAGNOSIS — I4892 Unspecified atrial flutter: Secondary | ICD-10-CM | POA: Diagnosis present

## 2019-03-24 DIAGNOSIS — I129 Hypertensive chronic kidney disease with stage 1 through stage 4 chronic kidney disease, or unspecified chronic kidney disease: Secondary | ICD-10-CM | POA: Diagnosis present

## 2019-03-24 DIAGNOSIS — E43 Unspecified severe protein-calorie malnutrition: Secondary | ICD-10-CM | POA: Diagnosis present

## 2019-03-24 DIAGNOSIS — Z955 Presence of coronary angioplasty implant and graft: Secondary | ICD-10-CM | POA: Diagnosis not present

## 2019-03-24 DIAGNOSIS — I2510 Atherosclerotic heart disease of native coronary artery without angina pectoris: Secondary | ICD-10-CM | POA: Diagnosis present

## 2019-03-24 DIAGNOSIS — Z809 Family history of malignant neoplasm, unspecified: Secondary | ICD-10-CM | POA: Diagnosis not present

## 2019-03-24 DIAGNOSIS — J939 Pneumothorax, unspecified: Secondary | ICD-10-CM

## 2019-03-24 DIAGNOSIS — Z820 Family history of epilepsy and other diseases of the nervous system: Secondary | ICD-10-CM

## 2019-03-24 DIAGNOSIS — Z20822 Contact with and (suspected) exposure to covid-19: Secondary | ICD-10-CM | POA: Diagnosis present

## 2019-03-24 DIAGNOSIS — Z87891 Personal history of nicotine dependence: Secondary | ICD-10-CM | POA: Diagnosis not present

## 2019-03-24 DIAGNOSIS — I4510 Unspecified right bundle-branch block: Secondary | ICD-10-CM | POA: Diagnosis present

## 2019-03-24 DIAGNOSIS — Z8611 Personal history of tuberculosis: Secondary | ICD-10-CM

## 2019-03-24 DIAGNOSIS — L8990 Pressure ulcer of unspecified site, unspecified stage: Secondary | ICD-10-CM | POA: Diagnosis present

## 2019-03-24 DIAGNOSIS — J449 Chronic obstructive pulmonary disease, unspecified: Secondary | ICD-10-CM | POA: Diagnosis present

## 2019-03-24 DIAGNOSIS — L89312 Pressure ulcer of right buttock, stage 2: Secondary | ICD-10-CM | POA: Diagnosis present

## 2019-03-24 DIAGNOSIS — K219 Gastro-esophageal reflux disease without esophagitis: Secondary | ICD-10-CM | POA: Diagnosis present

## 2019-03-24 DIAGNOSIS — L89322 Pressure ulcer of left buttock, stage 2: Secondary | ICD-10-CM | POA: Diagnosis present

## 2019-03-24 DIAGNOSIS — Z7982 Long term (current) use of aspirin: Secondary | ICD-10-CM

## 2019-03-24 DIAGNOSIS — J9383 Other pneumothorax: Secondary | ICD-10-CM | POA: Diagnosis present

## 2019-03-24 DIAGNOSIS — I4891 Unspecified atrial fibrillation: Secondary | ICD-10-CM | POA: Diagnosis present

## 2019-03-24 DIAGNOSIS — J9311 Primary spontaneous pneumothorax: Secondary | ICD-10-CM | POA: Diagnosis not present

## 2019-03-24 DIAGNOSIS — Z9689 Presence of other specified functional implants: Secondary | ICD-10-CM

## 2019-03-24 HISTORY — DX: Pneumothorax, unspecified: J93.9

## 2019-03-24 LAB — COMPREHENSIVE METABOLIC PANEL
ALT: 30 U/L (ref 0–44)
AST: 46 U/L — ABNORMAL HIGH (ref 15–41)
Albumin: 2.9 g/dL — ABNORMAL LOW (ref 3.5–5.0)
Alkaline Phosphatase: 75 U/L (ref 38–126)
Anion gap: 11 (ref 5–15)
BUN: 30 mg/dL — ABNORMAL HIGH (ref 8–23)
CO2: 29 mmol/L (ref 22–32)
Calcium: 10.9 mg/dL — ABNORMAL HIGH (ref 8.9–10.3)
Chloride: 96 mmol/L — ABNORMAL LOW (ref 98–111)
Creatinine, Ser: 1.04 mg/dL (ref 0.61–1.24)
GFR calc Af Amer: >60 mL/min (ref >60)
GFR calc non Af Amer: >60 mL/min (ref >60)
Glucose, Bld: 128 mg/dL — ABNORMAL HIGH (ref 70–99)
Potassium: 3.5 mmol/L (ref 3.5–5.1)
Sodium: 136 mmol/L (ref 135–145)
Total Bilirubin: 1.4 mg/dL — ABNORMAL HIGH (ref 0.3–1.2)
Total Protein: 7.3 g/dL (ref 6.5–8.1)

## 2019-03-24 LAB — CBC WITH DIFFERENTIAL/PLATELET
Abs Immature Granulocytes: 0.05 10*3/uL (ref 0.00–0.07)
Basophils Absolute: 0 10*3/uL (ref 0.0–0.1)
Basophils Relative: 0 %
Eosinophils Absolute: 0.1 10*3/uL (ref 0.0–0.5)
Eosinophils Relative: 1 %
HCT: 39 % (ref 39.0–52.0)
Hemoglobin: 12.4 g/dL — ABNORMAL LOW (ref 13.0–17.0)
Immature Granulocytes: 1 %
Lymphocytes Relative: 15 %
Lymphs Abs: 1.6 10*3/uL (ref 0.7–4.0)
MCH: 34.5 pg — ABNORMAL HIGH (ref 26.0–34.0)
MCHC: 31.8 g/dL (ref 30.0–36.0)
MCV: 108.6 fL — ABNORMAL HIGH (ref 80.0–100.0)
Monocytes Absolute: 0.5 10*3/uL (ref 0.1–1.0)
Monocytes Relative: 5 %
Neutro Abs: 8.2 10*3/uL — ABNORMAL HIGH (ref 1.7–7.7)
Neutrophils Relative %: 78 %
Platelets: 284 10*3/uL (ref 150–400)
RBC: 3.59 MIL/uL — ABNORMAL LOW (ref 4.22–5.81)
RDW: 16.4 % — ABNORMAL HIGH (ref 11.5–15.5)
WBC: 10.6 10*3/uL — ABNORMAL HIGH (ref 4.0–10.5)
nRBC: 0 % (ref 0.0–0.2)

## 2019-03-24 MED ORDER — SODIUM CHLORIDE 0.9 % IV BOLUS
500.0000 mL | Freq: Once | INTRAVENOUS | Status: AC
  Administered 2019-03-24: 500 mL via INTRAVENOUS

## 2019-03-24 MED ORDER — LIDOCAINE-EPINEPHRINE (PF) 2 %-1:200000 IJ SOLN
10.0000 mL | Freq: Once | INTRAMUSCULAR | Status: DC
  Filled 2019-03-24: qty 10

## 2019-03-24 NOTE — ED Provider Notes (Signed)
  CHEST TUBE INSERTION  Date/Time: 03/24/2019 8:05 PM Performed by: Lorayne Bender, PA-C Authorized by: Lorayne Bender, PA-C   Consent:    Consent obtained:  Verbal   Consent given by:  Patient   Risks discussed:  Bleeding, incomplete drainage, nerve damage, pain, infection and damage to surrounding structures Pre-procedure details:    Skin preparation:  ChloraPrep   Preparation: Patient was prepped and draped in the usual sterile fashion   Anesthesia (see MAR for exact dosages):    Anesthesia method:  Local infiltration   Local anesthetic:  Lidocaine 1% w/o epi Procedure details:    Placement location:  R lateral   Scalpel size:  11   Tube size (French): 14.   Ultrasound guidance: no     Tension pneumothorax: no     Tube connected to:  Suction   Drainage characteristics:  Serosanguinous   Suture material:  2-0 silk Post-procedure details:    Post-insertion x-ray findings: tube in good position     Patient tolerance of procedure:  Tolerated well, no immediate complications     Layla Maw 03/24/19 2337    Sherwood Gambler, MD 03/25/19 [phone]

## 2019-03-24 NOTE — ED Provider Notes (Signed)
[facility] EMERGENCY DEPARTMENT Provider Note   CSN: [id number] Arrival date & time: 03/24/19  1750     History Chief Complaint  Patient presents with  . Abdominal Pain    Jacob Hampton is a 86 y.o. male.  HPI 85 year old male presents with generalized weakness. History is mostly from son at bedside. Has had dizziness for quite some time, seems to be attributed to poor po intake. Worse over past couple days. Not eating and drinking well. Feels generally weak. No headache, fever, vomiting, diarrhea, cough, chest pain or altered mental status. Patient has no complaints currently. Has chronic dyspnea, but he denies any dyspnea when asked.  Has noticed on and off "gas pain" on the right side that is not present now for the last day or so.   Past Medical History:  Diagnosis Date  . Anemia   . Angina   . Arthritis   . Atrial flutter ([place])   . Blood transfusion    Had 8 units for GI bleed  . CAD (coronary artery disease)    Dr. Wynonia Lawman is his Cardiologist  . Dizzy    "when I take my blood pressure medicine sometimes it makes me a little dizzy"  . GERD (gastroesophageal reflux disease)   . Heart murmur   . History of primary tuberculosis   . History of upper GI bleeding    8 units for GI bleed   . Hyperlipidemia   . Hypertension   . Kidney stones   . Malignant neoplasm of upper lobe, unspecified bronchus or lung ([place])   . Pneumonia    h/o walking pneumonia at least 20 years ago  . Presence of aortocoronary bypass graft   . Presence of coronary angioplasty implant and graft   . Shortness of breath 05/18/11   "sometimes lying down; sometimes w/activity"  . Sinus drainage    11/24/10 "every morning when I get up I have to clear it out"  . Sleep disorder    "trouble staying asleep"  . Tuberculosis 1969   Was in sanitorium for 1 year  . Ventricular premature depolarization     Patient Active Problem List   Diagnosis Date Noted  . Pneumothorax on right 03/24/2019  .  Status post ablation of atrial flutter summer 2019 07/12/2018  . Malnutrition of moderate degree 12/31/2017  . Ureteral obstruction, left 12/30/2017  . Left ureteral stone 12/29/2017  . AKI (acute kidney injury) ([place]) 12/29/2017  . Dehydration 12/29/2017  . Hyponatremia 12/29/2017  . Acute lower UTI 12/29/2017  . Hyperkalemia 12/29/2017  . Obstipation 12/29/2017  . Pressure injury of skin 12/29/2017  . Prolonged QT interval 12/29/2017  . Atrial flutter ([place])   . Presence of aortocoronary bypass graft   . Presence of coronary angioplasty implant and graft   . Ventricular premature depolarization   . History of atrial fibrillation 12/08/2016  . COPD GOLD I  07/05/2015  . Calculus of kidney 06/08/2015  . Solitary pulmonary nodule 05/20/2015  . History of tuberculosis 11/17/2010  . CAD (coronary artery disease) 11/16/2010  . Hyperlipidemia 11/16/2010  . History of upper GI bleeding 11/16/2010  . History of CABG 11/16/2010    Past Surgical History:  Procedure Laterality Date  . A-FLUTTER ABLATION N/A 09/12/2017   Procedure: A-FLUTTER ABLATION;  Surgeon: Evans Lance, MD;  Location: [facility];  Service: Cardiovascular;  Laterality: N/A;  . APPENDECTOMY    . bleeding ulcer     w/gallbladder  . CATARACT  EXTRACTION W/ INTRAOCULAR LENS IMPLANT     bilaterally  . CHOLECYSTECTOMY     "w/bleeding ulcer OR"  . CORONARY ANGIOPLASTY WITH STENT PLACEMENT  02/2011  . CORONARY ARTERY BYPASS GRAFT  03/25/1999   CABG x5 using LIMA to LAD, SVG to Diag, SVG to OM, sequential SVG to PD and RPL, open SV harvest from left thigh and leg  . CORONARY ARTERY BYPASS GRAFT  11/22/2010   Procedure: REDO CORONARY ARTERY BYPASS GRAFTING (CABG)TIMES TWO;  Surgeon: Rexene Alberts, MD;  Location: [facility];  Service: Open Heart Surgery;  Laterality: N/A;  using endoscopically harvested right saphenous vein and left radial artery; Transesophageal echocardiogram  . CYSTOSCOPY W/ URETERAL STENT PLACEMENT  Left 12/29/2017   Procedure: CYSTOSCOPY WITH RETROGRADE PYELOGRAM/URETERAL STENT EXCHANGE;  Surgeon: Irine Seal, MD;  Location: [facility];  Service: Urology;  Laterality: Left;  . INGUINAL HERNIA REPAIR     ? left  . LEFT HEART CATHETERIZATION WITH CORONARY/GRAFT ANGIOGRAM N/A 03/01/2011   Procedure: LEFT HEART CATHETERIZATION WITH Beatrix Fetters;  Surgeon: Jettie Booze, MD;  Location: [facility] CATH LAB;  Service: Cardiovascular;  Laterality: N/A;  . LEFT HEART CATHETERIZATION WITH CORONARY/GRAFT ANGIOGRAM N/A 05/19/2011   Procedure: LEFT HEART CATHETERIZATION WITH Beatrix Fetters;  Surgeon: Jettie Booze, MD;  Location: [facility] CATH LAB;  Service: Cardiovascular;  Laterality: N/A;  . LITHOTRIPSY    . OTHER SURGICAL HISTORY  03/25/1999   reexploration for bleeding s/p CABG - coagulopathy without mechanical source  . PARTIAL GASTRECTOMY    . PERCUTANEOUS CORONARY STENT INTERVENTION (PCI-S) N/A 03/02/2011   Procedure: PERCUTANEOUS CORONARY STENT INTERVENTION (PCI-S);  Surgeon: Jettie Booze, MD;  Location[facility] LAB;  Service: Cardiovascular;  Laterality: N/A;  . PERCUTANEOUS CORONARY STENT INTERVENTION (PCI-S)  05/19/2011   Procedure: PERCUTANEOUS CORONARY STENT INTERVENTION (PCI-S);  Surgeon: Jettie Booze, MD;  Location: [facility] CATH LAB;  Service: Cardiovascular;;  . RADIAL ARTERY HARVEST  11/22/2010   Procedure: RADIAL ARTERY HARVEST;  Surgeon: Rexene Alberts, MD;  Location: [facility];  Service: Open Heart Surgery;  Laterality: Left;  . Redo CABG  11/22/2010   CABG x2 with left radial to OM and SVG to RCA  . Right testicle surgery         Family History  Problem Relation Age of Onset  . Cancer Father   . Unexplained death Mother   . Unexplained death Brother   . Bowel Disease Brother   . Alzheimer's disease Sister   . Cancer Brother   . Stroke Brother   . Heart attack Sister     Social History   Tobacco Use  . Smoking status: Former Smoker    Packs/day:  0.25    Years: 25.00    Pack years: 6.25    Types: Cigarettes    Quit date: 01/17/1975    Years since quitting: 44.2  . Smokeless tobacco: Never Used  Substance Use Topics  . Alcohol use: No    Alcohol/week: 0.0 standard drinks  . Drug use: No    Home Medications Prior to Admission medications   Medication Sig Start Date End Date Taking? Authorizing Provider  aspirin EC 81 MG tablet Take 81 mg by mouth at bedtime.    [provider]  atorvastatin (LIPITOR) 80 MG tablet Take 1 tablet (80 mg total) by mouth at bedtime. 02/10/19   Park Liter, MD  clopidogrel (PLAVIX) 75 MG tablet Take 1 tablet (75 mg total) by mouth at bedtime. 02/10/19  Park Liter, MD  ezetimibe (ZETIA) 10 MG tablet Take 1 tablet (10 mg total) by mouth at bedtime. 02/10/19   Park Liter, MD  feeding supplement, ENSURE ENLIVE, (ENSURE ENLIVE) LIQD Take 237 mLs by mouth 2 (two) times daily between meals. 01/01/18   Hongalgi, Lenis Dickinson, MD  folic acid (FOLVITE) 1 MG tablet TAKE 1 TABLET BY MOUTH AT BEDTIME 03/05/19   Park Liter, MD  nitroGLYCERIN (NITROSTAT) 0.4 MG SL tablet Place 0.4 mg under the tongue every 5 (five) minutes as needed. For chest pain    [provider]    Allergies    Patient has no known allergies.  Review of Systems   Review of Systems  Constitutional: Negative for fever.  Respiratory: Positive for shortness of breath (chronic). Negative for cough.   Cardiovascular: Negative for chest pain.  Gastrointestinal: Negative for abdominal pain, diarrhea and vomiting.  Neurological: Positive for dizziness and weakness. Negative for headaches.  All other systems reviewed and are negative.   Physical Exam Updated Vital Signs BP (!) 103/50   Pulse (!) 40   Temp 97.6 F (36.4 C) (Oral)   Resp 16   Ht 5\' 6"  (1.676 m)   Wt 45.5 kg   SpO2 100%   BMI 16.17 kg/m   Physical Exam Vitals and nursing note reviewed.  Constitutional:      Appearance: He is  well-developed and underweight.  HENT:     Head: Normocephalic and atraumatic.     Right Ear: External ear normal.     Left Ear: External ear normal.     Nose: Nose normal.  Eyes:     General:        Right eye: No discharge.        Left eye: No discharge.  Cardiovascular:     Rate and Rhythm: Normal rate. Rhythm irregular.     Heart sounds: Normal heart sounds.  Pulmonary:     Effort: Pulmonary effort is normal.     Breath sounds: Normal breath sounds.  Abdominal:     Palpations: Abdomen is soft.     Tenderness: There is no abdominal tenderness. There is no right CVA tenderness or left CVA tenderness.  Musculoskeletal:     Cervical back: Neck supple.  Skin:    General: Skin is warm and dry.  Neurological:     Mental Status: He is alert.     Comments: 5/5 strength in all 4 extremities  Psychiatric:        Mood and Affect: Mood is not anxious.     ED Results / Procedures / Treatments   Labs (all labs ordered are listed, but only abnormal results are displayed) Labs Reviewed  COMPREHENSIVE METABOLIC PANEL - Abnormal; Notable for the following components:      Result Value   Chloride 96 (*)    Glucose, Bld 128 (*)    BUN 30 (*)    Calcium 10.9 (*)    Albumin 2.9 (*)    AST 46 (*)    Total Bilirubin 1.4 (*)    All other components within normal limits  CBC WITH DIFFERENTIAL/PLATELET - Abnormal; Notable for the following components:   WBC 10.6 (*)    RBC 3.59 (*)    Hemoglobin 12.4 (*)    MCV 108.6 (*)    MCH 34.5 (*)    RDW 16.4 (*)    Neutro Abs 8.2 (*)    All other components within normal limits  SARS CORONAVIRUS  2 (TAT 6-24 HRS)  URINALYSIS, ROUTINE W REFLEX MICROSCOPIC    EKG EKG Interpretation  Date/Time:  Monday March 24 2019 18:37:57 EST Ventricular Rate:  115 PR Interval:    QRS Duration: 150 QT Interval:  371 QTC Calculation: 459 R Axis:   67 Text Interpretation: Sinus tachycardia Paired ventricular premature complexes Right bundle branch block  Confirmed by Sherwood Gambler [phone]) on 03/24/2019 7:42:45 PM   Radiology DG Sacrum/Coccyx  Result Date: 03/24/2019 CLINICAL DATA:  Pain status post fall. EXAM: SACRUM AND COCCYX - 2+ VIEW COMPARISON:  12/30/2017 FINDINGS: There is no evidence of fracture or other focal bone lesions. Vascular calcifications are noted. There is a large amount of stool at the level of the rectum. Mild-to-moderate degenerative changes are noted of both hips. There is diffuse osteopenia which limits detection of nondisplaced fractures. IMPRESSION: 1. No acute displaced fracture, however evaluation is limited by diffuse osteopenia. 2. Degenerative changes of both hips. 3. Large amount of stool at the level of the rectum. Electronically Signed   By: Constance Holster M.D.   On: 03/24/2019 19:22   DG Chest Portable 1 View  Result Date: 03/24/2019 CLINICAL DATA:  Check chest tube placement EXAM: PORTABLE CHEST 1 VIEW COMPARISON:  03/24/2019 FINDINGS: Pigtail chest tube catheter is now seen in the right base. Previously seen right-sided pneumothorax has resolved. Blunting of left costophrenic angle is noted stable from the prior exam. Postsurgical changes are again seen. IMPRESSION: Resolution of previously seen right-sided pneumothorax. The remainder of the exam is stable. Electronically Signed   By: Inez Catalina M.D.   On: 03/24/2019 20:41   DG Chest Portable 1 View  Result Date: 03/24/2019 CLINICAL DATA:  Generalized weakness. EXAM: PORTABLE CHEST 1 VIEW COMPARISON:  12/17/2018 FINDINGS: There is persistent blunting of the left hemidiaphragm. There is an apparent moderate size right-sided pneumothorax. The patient is status post prior median sternotomy. There is no significant pleural effusion. Aortic calcifications are noted. The heart size is stable. There is no acute osseous abnormality detected on this study. IMPRESSION: 1. Moderate-sized right-sided pneumothorax. 2. Otherwise, no significant interval change from prior  study. These results were called by telephone at the time of interpretation on 03/24/2019 at 7:02 pm to provider Sherwood Gambler , who verbally acknowledged these results. Electronically Signed   By: Constance Holster M.D.   On: 03/24/2019 19:04    Procedures .Critical Care Performed by: Sherwood Gambler, MD Authorized by: Sherwood Gambler, MD   Critical care provider statement:    Critical care time (minutes):  35   Critical care time was exclusive of:  Separately billable procedures and treating other patients   Critical care was necessary to treat or prevent imminent or life-threatening deterioration of the following conditions:  Shock and respiratory failure   Critical care was time spent personally by me on the following activities:  Discussions with consultants, evaluation of patient's response to treatment, examination of patient, ordering and performing treatments and interventions, ordering and review of laboratory studies, ordering and review of radiographic studies, pulse oximetry, re-evaluation of patient's condition, obtaining history from patient or surrogate and review of old charts   (including critical care time)  Medications Ordered in ED Medications  lidocaine-EPINEPHrine (XYLOCAINE W/EPI) 2 %-1:200000 (PF) injection 10 mL (10 mLs Intradermal Not Given 03/24/19 2040)  sodium chloride 0.9 % bolus 500 mL (0 mLs Intravenous Stopped 03/24/19 2020)    ED Course  I have reviewed the triage vital signs and the nursing notes.  Pertinent  labs & imaging results that were available during my care of the patient were reviewed by me and considered in my medical decision making (see chart for details).    MDM Rules/Calculators/A&P                      Patient is found to have a spontaneous pneumothorax on the right side.  This would likely explain his vague "gas pain" that he was having on the right side over the last day or 2.  Chest tube placed with resolution of pneumothorax.   Discussed with Dr. Orvan Seen of CT surgery who will consult to help manage chest tube.  Hospitalist to admit.  Discussed with Dr. Myna Hidalgo.  Otherwise his complaints are vague and likely related to poor p.o. intake.  Jacob Hampton was evaluated in Emergency Department on 03/25/2019 for the symptoms described in the history of present illness. He was evaluated in the context of the global COVID-19 pandemic, which necessitated consideration that the patient might be at risk for infection with the SARS-CoV-2 virus that causes COVID-19. Institutional protocols and algorithms that pertain to the evaluation of patients at risk for COVID-19 are in a state of rapid change based on information released by regulatory bodies including the CDC and federal and state organizations. These policies and algorithms were followed during the patient's care in the ED.  Final Clinical Impression(s) / ED Diagnoses Final diagnoses:  Pneumothorax on right    Rx / DC Orders ED Discharge Orders    None       Sherwood Gambler, MD 03/25/19 [phone]

## 2019-03-24 NOTE — ED Notes (Addendum)
Juanda Crumble son; [phone] Neoma Laming ( daughter in law [phone])

## 2019-03-24 NOTE — ED Notes (Signed)
[facility] notified (Taryn) - patient ready for transport 

## 2019-03-24 NOTE — ED Notes (Signed)
ED Provider at bedside. 

## 2019-03-24 NOTE — ED Triage Notes (Signed)
No appetite, weight loss, right rib pain. He looks pale.

## 2019-03-25 ENCOUNTER — Encounter (HOSPITAL_COMMUNITY): Payer: Self-pay | Admitting: Family Medicine

## 2019-03-25 ENCOUNTER — Inpatient Hospital Stay (HOSPITAL_COMMUNITY): Payer: Medicare Other

## 2019-03-25 DIAGNOSIS — N182 Chronic kidney disease, stage 2 (mild): Secondary | ICD-10-CM | POA: Diagnosis present

## 2019-03-25 DIAGNOSIS — J9311 Primary spontaneous pneumothorax: Secondary | ICD-10-CM

## 2019-03-25 DIAGNOSIS — E43 Unspecified severe protein-calorie malnutrition: Secondary | ICD-10-CM

## 2019-03-25 HISTORY — DX: Chronic kidney disease, stage 2 (mild): N18.2

## 2019-03-25 HISTORY — DX: Unspecified severe protein-calorie malnutrition: E43

## 2019-03-25 LAB — CBC
HCT: 33.4 % — ABNORMAL LOW (ref 39.0–52.0)
Hemoglobin: 10.8 g/dL — ABNORMAL LOW (ref 13.0–17.0)
MCH: 34.6 pg — ABNORMAL HIGH (ref 26.0–34.0)
MCHC: 32.3 g/dL (ref 30.0–36.0)
MCV: 107.1 fL — ABNORMAL HIGH (ref 80.0–100.0)
Platelets: 251 10*3/uL (ref 150–400)
RBC: 3.12 MIL/uL — ABNORMAL LOW (ref 4.22–5.81)
RDW: 16.2 % — ABNORMAL HIGH (ref 11.5–15.5)
WBC: 9.2 10*3/uL (ref 4.0–10.5)
nRBC: 0 % (ref 0.0–0.2)

## 2019-03-25 LAB — COMPREHENSIVE METABOLIC PANEL
ALT: 25 U/L (ref 0–44)
AST: 36 U/L (ref 15–41)
Albumin: 2.4 g/dL — ABNORMAL LOW (ref 3.5–5.0)
Alkaline Phosphatase: 60 U/L (ref 38–126)
Anion gap: 12 (ref 5–15)
BUN: 27 mg/dL — ABNORMAL HIGH (ref 8–23)
CO2: 27 mmol/L (ref 22–32)
Calcium: 10.7 mg/dL — ABNORMAL HIGH (ref 8.9–10.3)
Chloride: 102 mmol/L (ref 98–111)
Creatinine, Ser: 1.17 mg/dL (ref 0.61–1.24)
GFR calc Af Amer: >60 mL/min (ref >60)
GFR calc non Af Amer: 53 mL/min — ABNORMAL LOW (ref >60)
Glucose, Bld: 95 mg/dL (ref 70–99)
Potassium: 3.6 mmol/L (ref 3.5–5.1)
Sodium: 141 mmol/L (ref 135–145)
Total Bilirubin: 1.3 mg/dL — ABNORMAL HIGH (ref 0.3–1.2)
Total Protein: 6.1 g/dL — ABNORMAL LOW (ref 6.5–8.1)

## 2019-03-25 LAB — SARS CORONAVIRUS 2 (TAT 6-24 HRS): SARS Coronavirus 2: NEGATIVE

## 2019-03-25 MED ORDER — ADULT MULTIVITAMIN W/MINERALS CH
1.0000 | ORAL_TABLET | Freq: Every day | ORAL | Status: DC
  Administered 2019-03-25 – 2019-03-28 (×4): 1 via ORAL
  Filled 2019-03-25 (×3): qty 1

## 2019-03-25 MED ORDER — SODIUM CHLORIDE 0.9 % IV SOLN: INTRAVENOUS | Status: DC

## 2019-03-25 MED ORDER — ATORVASTATIN CALCIUM 80 MG PO TABS
80.0000 mg | ORAL_TABLET | Freq: Every day | ORAL | Status: DC
  Administered 2019-03-25 – 2019-03-27 (×3): 80 mg via ORAL
  Filled 2019-03-25 (×3): qty 1

## 2019-03-25 MED ORDER — ACETAMINOPHEN 325 MG PO TABS
650.0000 mg | ORAL_TABLET | Freq: Four times a day (QID) | ORAL | Status: DC | PRN
  Administered 2019-03-27 (×2): 650 mg via ORAL
  Filled 2019-03-25 (×3): qty 2

## 2019-03-25 MED ORDER — CLOPIDOGREL BISULFATE 75 MG PO TABS
75.0000 mg | ORAL_TABLET | Freq: Every day | ORAL | Status: DC
  Administered 2019-03-25 – 2019-03-27 (×3): 75 mg via ORAL
  Filled 2019-03-25 (×3): qty 1

## 2019-03-25 MED ORDER — BOOST / RESOURCE BREEZE PO LIQD CUSTOM
1.0000 | Freq: Three times a day (TID) | ORAL | Status: DC
  Administered 2019-03-25 – 2019-03-28 (×10): 1 via ORAL

## 2019-03-25 MED ORDER — FOLIC ACID 1 MG PO TABS
1.0000 mg | ORAL_TABLET | Freq: Every day | ORAL | Status: DC
  Administered 2019-03-25 – 2019-03-27 (×3): 1 mg via ORAL
  Filled 2019-03-25 (×3): qty 1

## 2019-03-25 MED ORDER — ASPIRIN EC 81 MG PO TBEC
81.0000 mg | DELAYED_RELEASE_TABLET | Freq: Every day | ORAL | Status: DC
  Administered 2019-03-25 – 2019-03-27 (×3): 81 mg via ORAL
  Filled 2019-03-25 (×3): qty 1

## 2019-03-25 MED ORDER — ONDANSETRON HCL 4 MG/2ML IJ SOLN: 4.0000 mg | Freq: Four times a day (QID) | INTRAMUSCULAR | Status: DC | PRN

## 2019-03-25 NOTE — H&P (Addendum)
History and Physical    Jacob Hampton OHY:[id number] DOB: 09/28/1924 DOA: 03/24/2019  PCP: Patient, No Pcp Per   Patient coming from: Home   Chief Complaint: Wt loss, poor appetite, general weakness, right flank pain   HPI: Jacob Hampton is a 86 y.o. male with medical history significant for nephrolithiasis followed by urology, coronary artery disease, atrial flutter not anticoagulated due to history of life-threatening GI bleed, hypertension, and emphysema, presenting to the emergency department with loss of appetite, weight loss, and generalized weakness, and also noted some pain in the right lateral chest and flank.  Patient is poor appetite and general weakness has apparently been progressing over months to a year or more, he has had pain in the past associated with kidney stones, and had a similar pain on the right for the past couple days.  He has not felt particularly short of breath but has also not been active in the past couple weeks.  He denied any significant cough, fevers, or chills.  [facility] ED Course: Upon arrival to the ED, patient is found to be afebrile, saturating mid 90s on room air, tachycardic in the 110s, and with stable blood pressure.  EKG features sinus tachycardia with rate 115, PVCs, and RBBB.  Chest x-ray is concerning for a moderate-sized pneumothorax on the right.  Chemistry panel with creatinine 1.04, similar to priors.  CBC with mild leukocytosis and mild microcytic anemia.  Patient was given IV fluids and chest tube was placed in the emergency room with resolution of the pneumothorax.  Cardiothoracic surgery was consulted by the ED physician who agreed to help manage the chest tube but requested a medical admission.  COVID-19 screening test was negative.  Review of Systems:  All other systems reviewed and apart from HPI, are negative.  Past Medical History:  Diagnosis Date   Anemia    Angina    Arthritis    Atrial flutter ([place])    Blood  transfusion    Had 8 units for GI bleed   CAD (coronary artery disease)    Dr. Wynonia Lawman is his Cardiologist   Dizzy    "when I take my blood pressure medicine sometimes it makes me a little dizzy"   GERD (gastroesophageal reflux disease)    Heart murmur    History of primary tuberculosis    History of upper GI bleeding    8 units for GI bleed    Hyperlipidemia    Hypertension    Kidney stones    Malignant neoplasm of upper lobe, unspecified bronchus or lung (HCC)    Pneumonia    h/o walking pneumonia at least 20 years ago   Presence of aortocoronary bypass graft    Presence of coronary angioplasty implant and graft    Shortness of breath 05/18/11   "sometimes lying down; sometimes w/activity"   Sinus drainage    11/24/10 "every morning when I get up I have to clear it out"   Sleep disorder    "trouble staying asleep"   Tuberculosis 1969   Was in sanitorium for 1 year   Ventricular premature depolarization     Past Surgical History:  Procedure Laterality Date   A-FLUTTER ABLATION N/A 09/12/2017   Procedure: A-FLUTTER ABLATION;  Surgeon: Evans Lance, MD;  Location: [place] [facility];  Service: Cardiovascular;  Laterality: N/A;   APPENDECTOMY     bleeding ulcer     w/gallbladder   CATARACT EXTRACTION W/ INTRAOCULAR LENS IMPLANT  bilaterally   CHOLECYSTECTOMY     "w/bleeding ulcer OR"   CORONARY ANGIOPLASTY WITH STENT PLACEMENT  02/2011   CORONARY ARTERY BYPASS GRAFT  03/25/1999   CABG x5 using LIMA to LAD, SVG to Diag, SVG to OM, sequential SVG to PD and RPL, open SV harvest from left thigh and leg   CORONARY ARTERY BYPASS GRAFT  11/22/2010   Procedure: REDO CORONARY ARTERY BYPASS GRAFTING (CABG)TIMES TWO;  Surgeon: Rexene Alberts, MD;  Location: [place];  Service: Open Heart Surgery;  Laterality: N/A;  using endoscopically harvested right saphenous vein and left radial artery; Transesophageal echocardiogram   CYSTOSCOPY W/ URETERAL STENT PLACEMENT  Left 12/29/2017   Procedure: CYSTOSCOPY WITH RETROGRADE PYELOGRAM/URETERAL STENT EXCHANGE;  Surgeon: Irine Seal, MD;  Location: [facility];  Service: Urology;  Laterality: Left;   INGUINAL HERNIA REPAIR     ? left   LEFT HEART CATHETERIZATION WITH CORONARY/GRAFT ANGIOGRAM N/A 03/01/2011   Procedure: LEFT HEART CATHETERIZATION WITH Beatrix Fetters;  Surgeon: Jettie Booze, MD;  Location: [facility] CATH LAB;  Service: Cardiovascular;  Laterality: N/A;   LEFT HEART CATHETERIZATION WITH CORONARY/GRAFT ANGIOGRAM N/A 05/19/2011   Procedure: LEFT HEART CATHETERIZATION WITH Beatrix Fetters;  Surgeon: Jettie Booze, MD;  Location: [facility] CATH LAB;  Service: Cardiovascular;  Laterality: N/A;   LITHOTRIPSY     OTHER SURGICAL HISTORY  03/25/1999   reexploration for bleeding s/p CABG - coagulopathy without mechanical source   PARTIAL GASTRECTOMY     PERCUTANEOUS CORONARY STENT INTERVENTION (PCI-S) N/A 03/02/2011   Procedure: PERCUTANEOUS CORONARY STENT INTERVENTION (PCI-S);  Surgeon: Jettie Booze, MD;  Location: [facility] CATH LAB;  Service: Cardiovascular;  Laterality: N/A;   PERCUTANEOUS CORONARY STENT INTERVENTION (PCI-S)  05/19/2011   Procedure: PERCUTANEOUS CORONARY STENT INTERVENTION (PCI-S);  Surgeon: Jettie Booze, MD;  Location: [facility] C[facility] LAB;  Service: Cardiovascular;;   RADIAL ARTERY HARVEST  11/22/2010   Procedure: RADIAL ARTERY HARVEST;  Surgeon: Rexene Alberts, MD;  Location: [place];  Service: Open Heart Surgery;  Laterality: Left;   Redo CABG  11/22/2010   CABG x2 with left radial to OM and SVG to RCA   Right testicle surgery       reports that he quit smoking about 44 years ago. His smoking use included cigarettes. He has a 6.25 pack-year smoking history. He has never used smokeless tobacco. He reports that he does not drink alcohol or use drugs.  No Known Allergies  Family History  Problem Relation Age of Onset   Cancer Father    Unexplained death Mother      Unexplained death Brother    Bowel Disease Brother    Alzheimer's disease Sister    Cancer Brother    Stroke Brother    Heart attack Sister      Prior to Admission medications   Medication Sig Start Date End Date Taking? Authorizing Provider  aspirin EC 81 MG tablet Take 81 mg by mouth at bedtime.    [provider]  atorvastatin (LIPITOR) 80 MG tablet Take 1 tablet (80 mg total) by mouth at bedtime. 02/10/19   Park Liter, MD  clopidogrel (PLAVIX) 75 MG tablet Take 1 tablet (75 mg total) by mouth at bedtime. 02/10/19   Park Liter, MD  ezetimibe (ZETIA) 10 MG tablet Take 1 tablet (10 mg total) by mouth at bedtime. 02/10/19   Park Liter, MD  feeding supplement, ENSURE ENLIVE, (ENSURE ENLIVE) LIQD Take 237 mLs by mouth 2 (two) times daily  between meals. 01/01/18   Hongalgi, Lenis Dickinson, MD  folic acid (FOLVITE) 1 MG tablet TAKE 1 TABLET BY MOUTH AT BEDTIME 03/05/19   Park Liter, MD  nitroGLYCERIN (NITROSTAT) 0.4 MG SL tablet Place 0.4 mg under the tongue every 5 (five) minutes as needed. For chest pain    [provider]    Physical Exam: Vitals:   03/24/19 2315 03/24/19 2330 03/24/19 2345 03/25/19 0100  BP: 104/64 99/63 (!) 103/50 (!) 111/55  Pulse: 82 (!) 39 (!) 40 80  Resp: 19 (!) 25 16 18   Temp:    98.1 F (36.7 C)  TempSrc:    Oral  SpO2: 97% 99% 100% 99%  Weight:      Height:         Constitutional: NAD, calm, frail-appearing   Eyes: PERTLA, lids and conjunctivae normal ENMT: Mucous membranes are moist. Posterior pharynx clear of any exudate or lesions.   Neck: normal, supple, no masses, no thyromegaly Respiratory: no wheezing, no crackles. No accessory muscle use.  Cardiovascular: S1 & S2 heard, regular rate and rhythm. No extremity edema.   Abdomen: No distension, no tenderness, soft. Bowel sounds active.  Musculoskeletal: no clubbing / cyanosis. No joint deformity upper and lower extremities.   Skin: no  significant rashes, lesions, ulcers. Warm, dry, well-perfused. Neurologic: No facial asymmetry. Sensation intact. Moving all extremities.  Psychiatric: Alert, oriented to person and place. Very pleasant and cooperative.    Labs and Imaging on Admission: I have personally reviewed following labs and imaging studies  CBC: Recent Labs  Lab 03/24/19 1836  WBC 10.6*  NEUTROABS 8.2*  HGB 12.4*  HCT 39.0  MCV 108.6*  PLT 973   Basic Metabolic Panel: Recent Labs  Lab 03/24/19 1836  NA 136  K 3.5  CL 96*  CO2 29  GLUCOSE 128*  BUN 30*  CREATININE 1.04  CALCIUM 10.9*   GFR: Estimated Creatinine Clearance: 28.6 mL/min (by C-G formula based on SCr of 1.04 mg/dL). Liver Function Tests: Recent Labs  Lab 03/24/19 1836  AST 46*  ALT 30  ALKPHOS 75  BILITOT 1.4*  PROT 7.3  ALBUMIN 2.9*   No results for input(s): LIPASE, AMYLASE in the last 168 hours. No results for input(s): AMMONIA in the last 168 hours. Coagulation Profile: No results for input(s): INR, PROTIME in the last 168 hours. Cardiac Enzymes: No results for input(s): CKTOTAL, CKMB, CKMBINDEX, TROPONINI in the last 168 hours. BNP (last 3 results) No results for input(s): PROBNP in the last 8760 hours. HbA1C: No results for input(s): HGBA1C in the last 72 hours. CBG: No results for input(s): GLUCAP in the last 168 hours. Lipid Profile: No results for input(s): CHOL, HDL, LDLCALC, TRIG, CHOLHDL, LDLDIRECT in the last 72 hours. Thyroid Function Tests: No results for input(s): TSH, T4TOTAL, FREET4, T3FREE, THYROIDAB in the last 72 hours. Anemia Panel: No results for input(s): VITAMINB12, FOLATE, FERRITIN, TIBC, IRON, RETICCTPCT in the last 72 hours. Urine analysis:    Component Value Date/Time   COLORURINE YELLOW 12/29/2017 1500   APPEARANCEUR CLOUDY (A) 12/29/2017 1500   LABSPEC 1.025 12/29/2017 1500   PHURINE 6.0 12/29/2017 1500   GLUCOSEU NEGATIVE 12/29/2017 1500   HGBUR LARGE (A) 12/29/2017 1500    BILIRUBINUR NEGATIVE 12/29/2017 1500   KETONESUR NEGATIVE 12/29/2017 1500   PROTEINUR 30 (A) 12/29/2017 1500   UROBILINOGEN 1.0 05/18/2011 1047   NITRITE NEGATIVE 12/29/2017 1500   LEUKOCYTESUR LARGE (A) 12/29/2017 1500   Sepsis Labs: @LABRCNTIP (procalcitonin:4,lacticidven:4) ) Recent Results (  from the past 240 hour(s))  SARS CORONAVIRUS 2 (TAT 6-24 HRS) Nasopharyngeal Nasopharyngeal Swab     Status: None   Collection Time: 03/24/19  8:16 PM   Specimen: Nasopharyngeal Swab  Result Value Ref Range Status   SARS Coronavirus 2 NEGATIVE NEGATIVE Final    Comment: (NOTE) SARS-CoV-2 target nucleic acids are NOT DETECTED. The SARS-CoV-2 RNA is generally detectable in upper and lower respiratory specimens during the acute phase of infection. Negative results do not preclude SARS-CoV-2 infection, do not rule out co-infections with other pathogens, and should not be used as the sole basis for treatment or other patient management decisions. Negative results must be combined with clinical observations, patient history, and epidemiological information. The expected result is Negative. Fact Sheet for Patients: SugarRoll.be Fact Sheet for Healthcare Providers: https://www.woods-mathews.com/ This test is not yet approved or cleared by the [place] FDA and  has been authorized for detection and/or diagnosis of SARS-CoV-2 by FDA under an Emergency Use Authorization (EUA). This EUA will remain  in effect (meaning this test can be used) for the duration of the COVID-19 declaration under Section 56 4(b)(1) of the Act, 21 U.S.C. section 360bbb-3(b)(1), unless the authorization is terminated or revoked sooner. Performed at [facility], [address], [place], [place] [address]      Radiological Exams on Admission: DG Sacrum/Coccyx  Result Date: 03/24/2019 CLINICAL DATA:  Pain status post fall. EXAM: SACRUM AND COCCYX - 2+ VIEW COMPARISON:   12/30/2017 FINDINGS: There is no evidence of fracture or other focal bone lesions. Vascular calcifications are noted. There is a large amount of stool at the level of the rectum. Mild-to-moderate degenerative changes are noted of both hips. There is diffuse osteopenia which limits detection of nondisplaced fractures. IMPRESSION: 1. No acute displaced fracture, however evaluation is limited by diffuse osteopenia. 2. Degenerative changes of both hips. 3. Large amount of stool at the level of the rectum. Electronically Signed   By: Constance Holster M.D.   On: 03/24/2019 19:22   DG Chest Portable 1 View  Result Date: 03/24/2019 CLINICAL DATA:  Check chest tube placement EXAM: PORTABLE CHEST 1 VIEW COMPARISON:  03/24/2019 FINDINGS: Pigtail chest tube catheter is now seen in the right base. Previously seen right-sided pneumothorax has resolved. Blunting of left costophrenic angle is noted stable from the prior exam. Postsurgical changes are again seen. IMPRESSION: Resolution of previously seen right-sided pneumothorax. The remainder of the exam is stable. Electronically Signed   By: Inez Catalina M.D.   On: 03/24/2019 20:41   DG Chest Portable 1 View  Result Date: 03/24/2019 CLINICAL DATA:  Generalized weakness. EXAM: PORTABLE CHEST 1 VIEW COMPARISON:  12/17/2018 FINDINGS: There is persistent blunting of the left hemidiaphragm. There is an apparent moderate size right-sided pneumothorax. The patient is status post prior median sternotomy. There is no significant pleural effusion. Aortic calcifications are noted. The heart size is stable. There is no acute osseous abnormality detected on this study. IMPRESSION: 1. Moderate-sized right-sided pneumothorax. 2. Otherwise, no significant interval change from prior study. These results were called by telephone at the time of interpretation on 03/24/2019 at 7:02 pm to provider Sherwood Gambler , who verbally acknowledged these results. Electronically Signed   By: Constance Holster M.D.   On: 03/24/2019 19:04    EKG: Independently reviewed. Sinus tachycardia (rate 115), PVCs, RBBB.   Assessment/Plan   1. Spontaneous pneumothorax on right  - Presents with poor appetite, wt loss, and gen weakness, also reported some  pain in the right chest/flank, and was found to have PTX on right that resolved with chest tube placement  - Appreciate CTS with chest tube management, will continue supplemental O2 and supportive care    2. COPD  - No cough or wheezing on admission    3. CAD  - No anginal complaints, continue ASA and statin    4. Atrial flutter  - In sinus rhythm, not anticoagulated d/t GIB   5. CKD stage II  - Appears stable, renally-dose medications   6. Loss of appetite, weight loss, protein-calorie malnutrition  - Consult dietary     DVT prophylaxis: SCDs Code Status: Full; discussed with patient on admission  Family Communication: Patient lives with his son who couldn't be reached by phone at time of admission  Disposition Plan: Likely home once cleared for discharge by CTS  Consults called: Cardiothoracic surgery consulted by ED physician  Admission status: Inpatient    Vianne Bulls, MD [facility] Pager: See [email or web address]  If 7AM-7PM, please contact the daytime attending [email or web address]  03/25/2019, 3:17 AM

## 2019-03-25 NOTE — Evaluation (Addendum)
Physical Therapy Evaluation Patient Details Name: Jacob Hampton MRN: [id number] DOB: [birth year] Today's Date: 03/25/2019   History of Present Illness  Pt is a 86 y/o male admitted secondary to weakness and R sided chest pain. Found to have R pneumothorax and is s/p chest tube insertion. PMH includes CAD s/p CABG, COPD, a flutter, and CKD.  Clinical Impression  Pt admitted secondary to problem above with deficits below. Pt requiring min A to sit at EOB. Pt with increased pain in R chest at chest tube site and requesting to defer further mobility. Per pt and family, plan is to return home. Does not want to go to SNF facility. Has 24/7 support at home. Will determine equipment needs pending progression. Will continue to follow acutely to maximize functional mobility independence and safety.      Follow Up Recommendations Home health PT;Supervision/Assistance - 24 hour(HHaide) ( [facility])    Equipment Recommendations  Other (comment)(TBD)    Recommendations for Other Services       Precautions / Restrictions Precautions Precautions: Fall;Other (comment) Precaution Comments: R chest tube Restrictions Weight Bearing Restrictions: No      Mobility  Bed Mobility Overal bed mobility: Needs Assistance Bed Mobility: Supine to Sit;Sit to Supine     Supine to sit: Min assist Sit to supine: Min assist   General bed mobility comments: Min A for trunk elevation to come to sitting. Pt with increased pain at chest tube site and requesting to defer further mobility.   Transfers                    Ambulation/Gait                Stairs            Wheelchair Mobility    Modified Rankin (Stroke Patients Only)       Balance Overall balance assessment: Needs assistance Sitting-balance support: No upper extremity supported Sitting balance-Leahy Scale: Fair                                       Pertinent Vitals/Pain Pain  Assessment: Faces Faces Pain Scale: Hurts even more Pain Location: chest tube site Pain Descriptors / Indicators: Grimacing;Guarding;Moaning Pain Intervention(s): Limited activity within patient's tolerance;Monitored during session;Repositioned    Home Living Family/patient expects to be discharged to:: Private residence Living Arrangements: Children Available Help at Discharge: Friend(s);Available 24 hours/day Type of Home: House Home Access: Stairs to enter Entrance Stairs-Rails: None Entrance Stairs-Number of Steps: 2 Home Layout: One level Home Equipment: Walker - 2 wheels;Bedside commode;Shower seat      Prior Function Level of Independence: Needs assistance   Gait / Transfers Assistance Needed: Requires assist for bed mobility and requires supervision to walk with standard walker to and from bathroom. Only walks to//from bathroom most of the time.   ADL's / Homemaking Assistance Needed: Requires assist for ADL tasks.         Hand Dominance        Extremity/Trunk Assessment   Upper Extremity Assessment Upper Extremity Assessment: Defer to OT evaluation    Lower Extremity Assessment Lower Extremity Assessment: Generalized weakness    Cervical / Trunk Assessment Cervical / Trunk Assessment: Other exceptions;Kyphotic Cervical / Trunk Exceptions: sacral wound at baseline  Communication   Communication: HOH  Cognition Arousal/Alertness: Awake/alert Behavior During Therapy: WFL for tasks assessed/performed Overall Cognitive Status: Within  Functional Limits for tasks assessed                                        General Comments      Exercises     Assessment/Plan    PT Assessment Patient needs continued PT services  PT Problem List Decreased strength;Decreased balance;Decreased mobility;Decreased activity tolerance;Decreased knowledge of use of DME       PT Treatment Interventions Gait training;Stair training;Therapeutic  activities;Functional mobility training;Balance training;Therapeutic exercise;DME instruction;Patient/family education    PT Goals (Current goals can be found in the Care Plan section)  Acute Rehab PT Goals Patient Stated Goal: to go home PT Goal Formulation: With patient Time For Goal Achievement: 04/08/19 Potential to Achieve Goals: Fair    Frequency Min 3X/week   Barriers to discharge        Co-evaluation               AM-PAC PT "6 Clicks" Mobility  Outcome Measure Help needed turning from your back to your side while in a flat bed without using bedrails?: A Little Help needed moving from lying on your back to sitting on the side of a flat bed without using bedrails?: A Little Help needed moving to and from a bed to a chair (including a wheelchair)?: A Little Help needed standing up from a chair using your arms (e.g., wheelchair or bedside chair)?: A Little Help needed to walk in hospital room?: A Lot Help needed climbing 3-5 steps with a railing? : A Lot 6 Click Score: 16    End of Session   Activity Tolerance: Patient limited by pain Patient left: in bed;with call bell/phone within reach;with family/visitor present Nurse Communication: Mobility status PT Visit Diagnosis: Unsteadiness on feet (R26.81);Muscle weakness (generalized) (M62.81);Difficulty in walking, not elsewhere classified (R26.2)    Time: 9562-1308 PT Time Calculation (min) (ACUTE ONLY): 24 min   Charges:   PT Evaluation $PT Eval Moderate Complexity: 1 Mod PT Treatments $Therapeutic Activity: 8-22 mins        Lou Miner, DPT  Acute Rehabilitation Services  Pager: [phone] Office: [phone]   Rudean Hitt 03/25/2019, 3:18 PM

## 2019-03-25 NOTE — Progress Notes (Signed)
Initial Nutrition Assessment  DOCUMENTATION CODES:   Underweight, Severe malnutrition in context of chronic illness  INTERVENTION:   Continue Boost Breeze po TID, each supplement provides 250 kcal and 9 grams of protein  Once diet advanced:   Ensure Enlive po BID, each supplement provides 350 kcal and 20 grams of protein  Magic cup BID with meals, each supplement provides 290 kcal and 9 grams of protein  MVI daily   NUTRITION DIAGNOSIS:   Severe Malnutrition related to chronic illness(COPD) as evidenced by severe muscle depletion, severe fat depletion, percent weight loss.  GOAL:   Patient will meet greater than or equal to 90% of their needs  MONITOR:   PO intake, Supplement acceptance, Diet advancement, Weight trends, Labs, I & O's, Skin  REASON FOR ASSESSMENT:   Consult Assessment of nutrition requirement/status  ASSESSMENT:   Patient with PMH significant for nephrolithiasis, CAD s/p CABG (2001) and redo (2012), HTN, HLD, and COPD. Presents this admission with R pneumothorax.   Pt s/p chest tube. Denies loss of appetite PTA. Typically eats three meals daily but unable to elaborate on meal composition. Does not use supplementation at home. Currently on a clear liquid diet. RD to provide supplementation to maximize kcal and protein this admission.   Pt endorses a UBW of 140 lb and is unsure if he has recent lost weight. Records indicate pt weighed 122 lb on 08/01/2018 and 100 lb this admission (18% wt loss in 8 months, significant for time frame).   Medications: folic acid Labs: corrected calcium 12 (H) CBG 95-128   NUTRITION - FOCUSED PHYSICAL EXAM:    Most Recent Value  Orbital Region  Severe depletion  Upper Arm Region  Severe depletion  Thoracic and Lumbar Region  Severe depletion  Buccal Region  Severe depletion  Temple Region  Severe depletion  Clavicle Bone Region  Severe depletion  Clavicle and Acromion Bone Region  Severe depletion  Scapular Bone  Region  Severe depletion  Dorsal Hand  Severe depletion  Patellar Region  Severe depletion  Anterior Thigh Region  Severe depletion  Posterior Calf Region  Severe depletion  Edema (RD Assessment)  None  Hair  Reviewed  Eyes  Reviewed  Mouth  Reviewed  Skin  Reviewed  Nails  Reviewed     Diet Order:   Diet Order            Diet clear liquid Room service appropriate? Yes; Fluid consistency: Thin  Diet effective now              EDUCATION NEEDS:   Education needs have been addressed  Skin:  Skin Assessment: Skin Integrity Issues: Skin Integrity Issues:: Stage II Stage II: sacrum  Last BM:  3/9  Height:   Ht Readings from Last 1 Encounters:  03/24/19 5\' 6"  (1.676 m)    Weight:   Wt Readings from Last 1 Encounters:  03/24/19 45.5 kg    BMI:  Body mass index is 16.17 kg/m.  Estimated Nutritional Needs:   Kcal:  1400-1600 kcal  Protein:  70-85 grams  Fluid:  >/= 1.4 L/day   Mariana Single RD, LDN Clinical Nutrition Pager listed in [place]

## 2019-03-25 NOTE — Consult Note (Signed)
[address]       [place] [address]             [phone]        Shahmeer Wilber Bajandas Medical Record #[id number] Date of Birth: 09/17/1924  Referring: No ref. provider found Primary Care: Patient, No Pcp Per Primary Cardiologist:No primary care provider on file.  Chief Complaint:    Chief Complaint  Patient presents with  . Abdominal Pain    History of Present Illness:     Jacob Hampton is a 86 year old patient with a past medical history significant for redo coronary artery bypass grafting in 2012 with Dr. Roxy Manns (first CABG done in 2001), COPD, history of atrial flutter/afib, chronic kidney disease stage II, history of an upper GI bleed, and history of tuberculosis who presented to the emergency department with a chief complaint of generalized weakness.  He also complained of dizziness which he attributed to poor oral intake.  This has become worse over the last few days.  He is not eating or drinking much.  He denies headache, fever, vomiting, diarrhea, and cough.  He also endorses right-sided sharp gas pains that are intermittent.  An EKG was obtained which showed sinus tachycardia.  COVID-19 screening test was negative on 3/8.  Patient was given IV fluids and an emergent chest tube was placed in the emergency room with resolution of the right-sided pneumothorax.  Cardiothoracic surgery was consulted to manage patient's chest tube.  The patient's family shares that he was on the couch for most of the day at home only getting up to use the bathroom. He does have a sore on his bottom which they have been treating.    Current Activity/ Functional Status: Patient was independent with mobility/ambulation, transfers, ADL's, IADL's.   Zubrod Score: At the time of surgery this patient's most appropriate activity status/level should be described as: []     0    Normal activity, no symptoms []     1    Restricted in physical strenuous activity but ambulatory,  able to do out light work []     2    Ambulatory and capable of self care, unable to do work activities, up and about                 more than 50%  Of the time                            [x]     3    Only limited self care, in bed greater than 50% of waking hours []     4    Completely disabled, no self care, confined to bed or chair []     5    Moribund  Past Medical History:  Diagnosis Date  . Anemia   . Angina   . Arthritis   . Atrial flutter ([place])   . Blood transfusion    Had 8 units for GI bleed  . CAD (coronary artery disease)    Dr. Wynonia Lawman is his Cardiologist  . Dizzy    "when I take my blood pressure medicine sometimes it makes me a little dizzy"  . GERD (gastroesophageal reflux disease)   . Heart murmur   . History of primary tuberculosis   . History of upper GI bleeding    8 units for GI bleed   . Hyperlipidemia   . Hypertension   . Kidney  stones   . Malignant neoplasm of upper lobe, unspecified bronchus or lung ([place])   . Pneumonia    h/o walking pneumonia at least 20 years ago  . Presence of aortocoronary bypass graft   . Presence of coronary angioplasty implant and graft   . Shortness of breath 05/18/11   "sometimes lying down; sometimes w/activity"  . Sinus drainage    11/24/10 "every morning when I get up I have to clear it out"  . Sleep disorder    "trouble staying asleep"  . Tuberculosis 1969   Was in sanitorium for 1 year  . Ventricular premature depolarization     Past Surgical History:  Procedure Laterality Date  . A-FLUTTER ABLATION N/A 09/12/2017   Procedure: A-FLUTTER ABLATION;  Surgeon: Evans Lance, MD;  Location: [facility];  Service: Cardiovascular;  Laterality: N/A;  . APPENDECTOMY    . bleeding ulcer     w/gallbladder  . CATARACT EXTRACTION W/ INTRAOCULAR LENS IMPLANT     bilaterally  . CHOLECYSTECTOMY     "w/bleeding ulcer OR"  . CORONARY ANGIOPLASTY WITH STENT PLACEMENT  02/2011  . CORONARY ARTERY BYPASS GRAFT  03/25/1999   CABG  x5 using LIMA to LAD, SVG to Diag, SVG to OM, sequential SVG to PD and RPL, open SV harvest from left thigh and leg  . CORONARY ARTERY BYPASS GRAFT  11/22/2010   Procedure: REDO CORONARY ARTERY BYPASS GRAFTING (CABG)TIMES TWO;  Surgeon: Rexene Alberts, MD;  Location: [facility];  Service: Open Heart Surgery;  Laterality: N/A;  using endoscopically harvested right saphenous vein and left radial artery; Transesophageal echocardiogram  . CYSTOSCOPY W/ URETERAL STENT PLACEMENT Left 12/29/2017   Procedure: CYSTOSCOPY WITH RETROGRADE PYELOGRAM/URETERAL STENT EXCHANGE;  Surgeon: Irine Seal, MD;  Location: [facility];  Service: Urology;  Laterality: Left;  . INGUINAL HERNIA REPAIR     ? left  . LEFT HEART CATHETERIZATION WITH CORONARY/GRAFT ANGIOGRAM N/A 03/01/2011   Procedure: LEFT HEART CATHETERIZATION WITH Beatrix Fetters;  Surgeon: Jettie Booze, MD;  Location: [facility];  Service: Cardiovascular;  Laterality: N/A;  . LEFT HEART CATHETERIZATION WITH CORONARY/GRAFT ANGIOGRAM N/A 05/19/2011   Procedure: LEFT HEART CATHETERIZATION WITH Beatrix Fetters;  Surgeon: Jettie Booze, MD;  Location: [facility] CATH LAB;  Service: Cardiovascular;  Laterality: N/A;  . LITHOTRIPSY    . OTHER SURGICAL HISTORY  03/25/1999   reexploration for bleeding s/p CABG - coagulopathy without mechanical source  . PARTIAL GASTRECTOMY    . PERCUTANEOUS CORONARY STENT INTERVENTION (PCI-S) N/A 03/02/2011   Procedure: PERCUTANEOUS CORONARY STENT INTERVENTION (PCI-S);  Surgeon: Jettie Booze, MD;  Location: [facility] C[facility];  Service: Cardiovascular;  Laterality: N/A;  . PERCUTANEOUS CORONARY STENT INTERVENTION (PCI-S)  05/19/2011   Procedure: PERCUTANEOUS CORONARY STENT INTERVENTION (PCI-S);  Surgeon: Jettie Booze, MD;  Location: [facility] CATH LAB;  Service: Cardiovascular;;  . RADIAL ARTERY HARVEST  11/22/2010   Procedure: RADIAL ARTERY HARVEST;  Surgeon: Rexene Alberts, MD;  Location: [facility];  Service: Open Heart  Surgery;  Laterality: Left;  . Redo CABG  11/22/2010   CABG x2 with left radial to OM and SVG to RCA  . Right testicle surgery      Social History   Tobacco Use  Smoking Status Former Smoker  . Packs/day: 0.25  . Years: 25.00  . Pack years: 6.25  . Types: Cigarettes  . Quit date: 01/17/1975  . Years since quitting: 44.2  Smokeless Tobacco Never Used    Social History  Substance and Sexual Activity  Alcohol Use No  . Alcohol/week: 0.0 standard drinks     No Known Allergies  Current Facility-Administered Medications  Medication Dose Route Frequency Provider Last Rate Last Admin  . acetaminophen (TYLENOL) tablet 650 mg  650 mg Oral Q6H PRN Opyd, Ilene Qua, MD      . aspirin EC tablet 81 mg  81 mg Oral QHS Opyd, Ilene Qua, MD      . atorvastatin (LIPITOR) tablet 80 mg  80 mg Oral QHS Opyd, Ilene Qua, MD      . clopidogrel (PLAVIX) tablet 75 mg  75 mg Oral QHS Opyd, Ilene Qua, MD      . feeding supplement (BOOST / RESOURCE BREEZE) liquid 1 Container  1 Container Oral TID BM Opyd, Ilene Qua, MD   1 Container at 03/25/19 [phone]  . folic acid (FOLVITE) tablet 1 mg  1 mg Oral QHS Opyd, Timothy S, MD      . ondansetron (ZOFRAN) injection 4 mg  4 mg Intravenous Q6H PRN Opyd, Ilene Qua, MD        Medications Prior to Admission  Medication Sig Dispense Refill Last Dose  . aspirin EC 81 MG tablet Take 81 mg by mouth at bedtime.     Marland Kitchen atorvastatin (LIPITOR) 80 MG tablet Take 1 tablet (80 mg total) by mouth at bedtime. 90 tablet 1   . clopidogrel (PLAVIX) 75 MG tablet Take 1 tablet (75 mg total) by mouth at bedtime. 90 tablet 1   . ezetimibe (ZETIA) 10 MG tablet Take 1 tablet (10 mg total) by mouth at bedtime. 90 tablet 1   . feeding supplement, ENSURE ENLIVE, (ENSURE ENLIVE) LIQD Take 237 mLs by mouth 2 (two) times daily between meals. 503 mL 12   . folic acid (FOLVITE) 1 MG tablet TAKE 1 TABLET BY MOUTH AT BEDTIME 90 tablet 0   . nitroGLYCERIN (NITROSTAT) 0.4 MG SL tablet Place 0.4 mg  under the tongue every 5 (five) minutes as needed. For chest pain       Family History  Problem Relation Age of Onset  . Cancer Father   . Unexplained death Mother   . Unexplained death Brother   . Bowel Disease Brother   . Alzheimer's disease Sister   . Cancer Brother   . Stroke Brother   . Heart attack Sister      Review of Systems:   Review of Systems  Constitutional: Positive for malaise/fatigue and weight loss. Negative for chills and fever.  Respiratory: Positive for shortness of breath. Negative for cough and sputum production.   Cardiovascular: Negative for chest pain and leg swelling.  Gastrointestinal: Negative for abdominal pain, nausea and vomiting.   Pertinent items are noted in HPI.    Physical Exam: BP (!) 96/53 (BP Location: Left Arm)   Pulse 81   Temp 98 F (36.7 C) (Oral)   Resp 19   Ht 5\' 6"  (1.676 m)   Wt 45.5 kg   SpO2 100%   BMI 16.17 kg/m    General appearance: thin, frail, alert, cooperative and no distress Resp: clear to auscultation bilaterally Cardio: regular rate and rhythm, S1, S2 normal, no murmur, click, rub or gallop GI: soft, non-tender; bowel sounds normal; no masses,  no organomegaly Extremities: extremities normal, atraumatic, no cyanosis or edema Neurologic: Grossly normal  Diagnostic Studies & Laboratory data:     Recent Radiology Findings:   DG Sacrum/Coccyx  Result Date: 03/24/2019 CLINICAL DATA:  Pain status post  fall. EXAM: SACRUM AND COCCYX - 2+ VIEW COMPARISON:  12/30/2017 FINDINGS: There is no evidence of fracture or other focal bone lesions. Vascular calcifications are noted. There is a large amount of stool at the level of the rectum. Mild-to-moderate degenerative changes are noted of both hips. There is diffuse osteopenia which limits detection of nondisplaced fractures. IMPRESSION: 1. No acute displaced fracture, however evaluation is limited by diffuse osteopenia. 2. Degenerative changes of both hips. 3. Large amount  of stool at the level of the rectum. Electronically Signed   By: Constance Holster M.D.   On: 03/24/2019 19:22   DG Chest Portable 1 View  Result Date: 03/24/2019 CLINICAL DATA:  Check chest tube placement EXAM: PORTABLE CHEST 1 VIEW COMPARISON:  03/24/2019 FINDINGS: Pigtail chest tube catheter is now seen in the right base. Previously seen right-sided pneumothorax has resolved. Blunting of left costophrenic angle is noted stable from the prior exam. Postsurgical changes are again seen. IMPRESSION: Resolution of previously seen right-sided pneumothorax. The remainder of the exam is stable. Electronically Signed   By: Inez Catalina M.D.   On: 03/24/2019 20:41   DG Chest Portable 1 View  Result Date: 03/24/2019 CLINICAL DATA:  Generalized weakness. EXAM: PORTABLE CHEST 1 VIEW COMPARISON:  12/17/2018 FINDINGS: There is persistent blunting of the left hemidiaphragm. There is an apparent moderate size right-sided pneumothorax. The patient is status post prior median sternotomy. There is no significant pleural effusion. Aortic calcifications are noted. The heart size is stable. There is no acute osseous abnormality detected on this study. IMPRESSION: 1. Moderate-sized right-sided pneumothorax. 2. Otherwise, no significant interval change from prior study. These results were called by telephone at the time of interpretation on 03/24/2019 at 7:02 pm to provider Sherwood Gambler , who verbally acknowledged these results. Electronically Signed   By: Constance Holster M.D.   On: 03/24/2019 19:04     I have independently reviewed the above radiologic studies and discussed with the patient   Recent Lab Findings: Lab Results  Component Value Date   WBC 9.2 03/25/2019   HGB 10.8 (L) 03/25/2019   HCT 33.4 (L) 03/25/2019   PLT 251 03/25/2019   GLUCOSE 95 03/25/2019   CHOL 117 03/01/2011   TRIG 68 03/01/2011   HDL 42 03/01/2011   LDLCALC 61 03/01/2011   ALT 25 03/25/2019   AST 36 03/25/2019   NA 141 03/25/2019     K 3.6 03/25/2019   CL 102 03/25/2019   CREATININE 1.17 03/25/2019   BUN 27 (H) 03/25/2019   CO2 27 03/25/2019   TSH 0.887 12/30/2017   INR 1.14 05/18/2011   HGBA1C 5.9 (H) 11/17/2010      Assessment / Plan:      1.  Right-sided pigtail catheter placement for right pneumothorax. 2.  History of coronary bypass grafting in 2001 by Dr. Roxy Manns and redo coronary bypass grafting in 2012.  Continue ASA and statin therapy. 3. COPD-not on any home medications. 4.  History of atrial flutter/atrial fibrillation-currently in normal sinus rhythm.  Continue aspirin and Plavix. 5.  CKD stage II-recent creatinine 1.17.  Stable. 6.  Loss of appetite, malnutrition-dietitian consult. 7. Fatigue/weakness-barely ambulatory at home. Will order PT/OT to work with the patient while he is here.   Plan: Pigtail catheter is on water seal. There is no air leak. Patient only endorses pain with deep breathing. CXR stable with tiny pneumo. Keep chest tube to water seal one more day. If CXR stable in the morning we may be  able to discontinue his chest tube.       I  spent 30 minutes counseling the patient face to face.   Nicholes Rough, PA-C 03/25/2019 9:59 AM

## 2019-03-25 NOTE — Plan of Care (Signed)
Patient arrived onto unit with transport. Placed into room and oriented to room. Skin assessment completed with charge nurse. Resting at this time in be with chest tube below the level of the chest.

## 2019-03-25 NOTE — Progress Notes (Signed)
PROGRESS NOTE    Jacob Hampton  PJA:[id number] DOB: October 25, 1924 DOA: 03/24/2019 PCP: Patient, No Pcp Per  Brief Narrative:  Jacob Hampton is a 89/M with history of CAD, atrial flutter not on anticoagulation due to GI bleeds,COPD/emphysema presented to the emergency room with weakness, loss of appetite, right lateral flank and chest pain. -In the emergency room he was noted to be tachycardic and mildly hypoxic, chest x-ray showed moderate right-sided pneumothorax and he had a chest tube placed in the ED   Assessment & Plan:  Spontaneous pneumothorax on right  -Likely secondary to COPD -Chest tube placed by EDP 3/8, currently to waterseal -Repeat chest x-ray -Will request a CTS consult  COPD  - Stable, no wheezing at this time  CAD /CABG x2 - Stable, continue ASA and statin    Atrial flutter  -Currently in sinus rhythm, not on anticoagulation due to history of GI bleed   Stage 2 chronic kidney disease  -Stable, renally dose medications  Severe protein calorie malnutrition -Dietitian consulted, supplements as tolerated  DVT prophylaxis:  SCDs Code Status: Full code Family Communication: no family at bedside, called and left message for daughter Raechel Chute Disposition Plan:  Home pending management of pneumothorax  Consults called: Cardiothoracic surgery      Procedures: Chest tube placed emergency room 3/8  Antimicrobials:    Subjective: -Pleasant, with some cognitive deficits this morning, has poor recollection of events from last night  Objective: Vitals:   03/24/19 2345 03/25/19 0100 03/25/19 0337 03/25/19 0418  BP: (!) 103/50 (!) 111/55 95/66 (!) 96/53  Pulse: (!) 40 80 84 81  Resp: 16 18 17 19   Temp:  98.1 F (36.7 C) 97.7 F (36.5 C) 98 F (36.7 C)  TempSrc:  Oral Oral Oral  SpO2: 100% 99% 99% 100%  Weight:      Height:        Intake/Output Summary (Last 24 hours) at 03/25/2019 1111 Last data filed at 03/25/2019 0345 Gross per 24 hour  Intake  11.97 ml  Output --  Net 11.97 ml   Filed Weights   03/24/19 1807  Weight: 45.5 kg    Examination:  General exam: Extremely cachectic thin elderly male sitting up in bed, awake alert oriented to self and partly to place only, he knows he is in [place]  Respiratory system: Poor air movement bilaterally, especially diminished on the right Cardiovascular system: S1-S2, regular rate rhythm  gastrointestinal system: Abdomen is nondistended, soft and nontender.Normal bowel sounds heard. Extremities: No edema, positive cyanosis  skin: No rashes on exposed skin Psychiatry: Poor judgment and insight    Data Reviewed:   CBC: Recent Labs  Lab 03/24/19 1836 03/25/19 0526  WBC 10.6* 9.2  NEUTROABS 8.2*  --   HGB 12.4* 10.8*  HCT 39.0 33.4*  MCV 108.6* 107.1*  PLT 284 341   Basic Metabolic Panel: Recent Labs  Lab 03/24/19 1836 03/25/19 0526  NA 136 141  K 3.5 3.6  CL 96* 102  CO2 29 27  GLUCOSE 128* 95  BUN 30* 27*  CREATININE 1.04 1.17  CALCIUM 10.9* 10.7*   GFR: Estimated Creatinine Clearance: 25.4 mL/min (by C-G formula based on SCr of 1.17 mg/dL). Liver Function Tests: Recent Labs  Lab 03/24/19 1836 03/25/19 0526  AST 46* 36  ALT 30 25  ALKPHOS 75 60  BILITOT 1.4* 1.3*  PROT 7.3 6.1*  ALBUMIN 2.9* 2.4*   No results for input(s): LIPASE, AMYLASE in the last 168 hours. No results for  input(s): AMMONIA in the last 168 hours. Coagulation Profile: No results for input(s): INR, PROTIME in the last 168 hours. Cardiac Enzymes: No results for input(s): CKTOTAL, CKMB, CKMBINDEX, TROPONINI in the last 168 hours. BNP (last 3 results) No results for input(s): PROBNP in the last 8760 hours. HbA1C: No results for input(s): HGBA1C in the last 72 hours. CBG: No results for input(s): GLUCAP in the last 168 hours. Lipid Profile: No results for input(s): CHOL, HDL, LDLCALC, TRIG, CHOLHDL, LDLDIRECT in the last 72 hours. Thyroid Function Tests: No results for  input(s): TSH, T4TOTAL, FREET4, T3FREE, THYROIDAB in the last 72 hours. Anemia Panel: No results for input(s): VITAMINB12, FOLATE, FERRITIN, TIBC, IRON, RETICCTPCT in the last 72 hours. Urine analysis:    Component Value Date/Time   COLORURINE YELLOW 12/29/2017 1500   APPEARANCEUR CLOUDY (A) 12/29/2017 1500   LABSPEC 1.025 12/29/2017 1500   PHURINE 6.0 12/29/2017 1500   GLUCOSEU NEGATIVE 12/29/2017 1500   HGBUR LARGE (A) 12/29/2017 1500   BILIRUBINUR NEGATIVE 12/29/2017 1500   KETONESUR NEGATIVE 12/29/2017 1500   PROTEINUR 30 (A) 12/29/2017 1500   UROBILINOGEN 1.0 05/18/2011 1047   NITRITE NEGATIVE 12/29/2017 1500   LEUKOCYTESUR LARGE (A) 12/29/2017 1500   Sepsis Labs: @LABRCNTIP (procalcitonin:4,lacticidven:4)  ) Recent Results (from the past 240 hour(s))  SARS CORONAVIRUS 2 (TAT 6-24 HRS) Nasopharyngeal Nasopharyngeal Swab     Status: None   Collection Time: 03/24/19  8:16 PM   Specimen: Nasopharyngeal Swab  Result Value Ref Range Status   SARS Coronavirus 2 NEGATIVE NEGATIVE Final    Comment: (NOTE) SARS-CoV-2 target nucleic acids are NOT DETECTED. The SARS-CoV-2 RNA is generally detectable in upper and lower respiratory specimens during the acute phase of infection. Negative results do not preclude SARS-CoV-2 infection, do not rule out co-infections with other pathogens, and should not be used as the sole basis for treatment or other patient management decisions. Negative results must be combined with clinical observations, patient history, and epidemiological information. The expected result is Negative. Fact Sheet for Patients: SugarRoll.be Fact Sheet for Healthcare Providers: https://www.woods-mathews.com/ This test is not yet approved or cleared by the [place] FDA and  has been authorized for detection and/or diagnosis of SARS-CoV-2 by FDA under an Emergency Use Authorization (EUA). This EUA will remain  in effect  (meaning this test can be used) for the duration of the COVID-19 declaration under Section 56 4(b)(1) of the Act, 21 U.S.C. section 360bbb-3(b)(1), unless the authorization is terminated or revoked sooner. Performed at [facility], [place] [address], [place], [place] [address]          Radiology Studies: DG Sacrum/Coccyx  Result Date: 03/24/2019 CLINICAL DATA:  Pain status post fall. EXAM: SACRUM AND COCCYX - 2+ VIEW COMPARISON:  12/30/2017 FINDINGS: There is no evidence of fracture or other focal bone lesions. Vascular calcifications are noted. There is a large amount of stool at the level of the rectum. Mild-to-moderate degenerative changes are noted of both hips. There is diffuse osteopenia which limits detection of nondisplaced fractures. IMPRESSION: 1. No acute displaced fracture, however evaluation is limited by diffuse osteopenia. 2. Degenerative changes of both hips. 3. Large amount of stool at the level of the rectum. Electronically Signed   By: Constance Holster M.D.   On: 03/24/2019 19:22   DG CHEST PORT 1 VIEW  Result Date: 03/25/2019 CLINICAL DATA:  Chest tube present. EXAM: PORTABLE CHEST 1 VIEW COMPARISON:  March 24, 2019 FINDINGS: The right-sided chest tube projects over the medial  right base, in stable position. There is a small lateral pneumothorax near the apex measuring 7 mm, unchanged in the interval. The right lung is clear. Mild atelectasis in the left base with a small associated effusion has improved. The cardiomediastinal silhouette is stable. IMPRESSION: 1. Stable right chest tube. Stable small right lateral apical pneumothorax unchanged measuring 7 mm. 2. There is a small effusion with underlying atelectasis in the left base which is mildly improved. Electronically Signed   By: Dorise Bullion III M.D   On: 03/25/2019 09:59   DG Chest Portable 1 View  Result Date: 03/24/2019 CLINICAL DATA:  Check chest tube placement EXAM: PORTABLE CHEST 1 VIEW COMPARISON:   03/24/2019 FINDINGS: Pigtail chest tube catheter is now seen in the right base. Previously seen right-sided pneumothorax has resolved. Blunting of left costophrenic angle is noted stable from the prior exam. Postsurgical changes are again seen. IMPRESSION: Resolution of previously seen right-sided pneumothorax. The remainder of the exam is stable. Electronically Signed   By: Inez Catalina M.D.   On: 03/24/2019 20:41   DG Chest Portable 1 View  Result Date: 03/24/2019 CLINICAL DATA:  Generalized weakness. EXAM: PORTABLE CHEST 1 VIEW COMPARISON:  12/17/2018 FINDINGS: There is persistent blunting of the left hemidiaphragm. There is an apparent moderate size right-sided pneumothorax. The patient is status post prior median sternotomy. There is no significant pleural effusion. Aortic calcifications are noted. The heart size is stable. There is no acute osseous abnormality detected on this study. IMPRESSION: 1. Moderate-sized right-sided pneumothorax. 2. Otherwise, no significant interval change from prior study. These results were called by telephone at the time of interpretation on 03/24/2019 at 7:02 pm to provider Sherwood Gambler , who verbally acknowledged these results. Electronically Signed   By: Constance Holster M.D.   On: 03/24/2019 19:04        Scheduled Meds: . aspirin EC  81 mg Oral QHS  . atorvastatin  80 mg Oral QHS  . clopidogrel  75 mg Oral QHS  . feeding supplement  1 Container Oral TID BM  . folic acid  1 mg Oral QHS   Continuous Infusions:   LOS: 1 day    Time spent: 85min    Domenic Polite, MD [facility]   03/25/2019, 11:11 AM

## 2019-03-26 ENCOUNTER — Inpatient Hospital Stay (HOSPITAL_COMMUNITY): Payer: Medicare Other

## 2019-03-26 LAB — CBC
HCT: 33.6 % — ABNORMAL LOW (ref 39.0–52.0)
Hemoglobin: 10.8 g/dL — ABNORMAL LOW (ref 13.0–17.0)
MCH: 34.5 pg — ABNORMAL HIGH (ref 26.0–34.0)
MCHC: 32.1 g/dL (ref 30.0–36.0)
MCV: 107.3 fL — ABNORMAL HIGH (ref 80.0–100.0)
Platelets: 244 10*3/uL (ref 150–400)
RBC: 3.13 MIL/uL — ABNORMAL LOW (ref 4.22–5.81)
RDW: 16.3 % — ABNORMAL HIGH (ref 11.5–15.5)
WBC: 8.4 10*3/uL (ref 4.0–10.5)
nRBC: 0 % (ref 0.0–0.2)

## 2019-03-26 LAB — BASIC METABOLIC PANEL
Anion gap: 9 (ref 5–15)
BUN: 28 mg/dL — ABNORMAL HIGH (ref 8–23)
CO2: 28 mmol/L (ref 22–32)
Calcium: 10.5 mg/dL — ABNORMAL HIGH (ref 8.9–10.3)
Chloride: 105 mmol/L (ref 98–111)
Creatinine, Ser: 1.02 mg/dL (ref 0.61–1.24)
GFR calc Af Amer: >60 mL/min (ref >60)
GFR calc non Af Amer: >60 mL/min (ref >60)
Glucose, Bld: 156 mg/dL — ABNORMAL HIGH (ref 70–99)
Potassium: 2.9 mmol/L — ABNORMAL LOW (ref 3.5–5.1)
Sodium: 142 mmol/L (ref 135–145)

## 2019-03-26 MED ORDER — POTASSIUM CHLORIDE CRYS ER 20 MEQ PO TBCR
40.0000 meq | EXTENDED_RELEASE_TABLET | ORAL | Status: AC
  Administered 2019-03-26 (×2): 40 meq via ORAL
  Filled 2019-03-26 (×2): qty 2

## 2019-03-26 NOTE — Plan of Care (Signed)

## 2019-03-26 NOTE — Plan of Care (Signed)
  Problem: Education: Goal: Knowledge of General Education information will improve Description: Including pain rating scale, medication(s)/side effects and non-pharmacologic comfort measures 03/26/2019 1745 by Glenard Haring, RN Outcome: Progressing 03/26/2019 1203 by Glenard Haring, RN Outcome: Progressing   Problem: Health Behavior/Discharge Planning: Goal: Ability to manage health-related needs will improve 03/26/2019 1745 by Glenard Haring, RN Outcome: Progressing 03/26/2019 1203 by Glenard Haring, RN Outcome: Progressing   Problem: Clinical Measurements: Goal: Ability to maintain clinical measurements within normal limits will improve 03/26/2019 1745 by Glenard Haring, RN Outcome: Progressing 03/26/2019 1203 by Glenard Haring, RN Outcome: Progressing Goal: Will remain free from infection 03/26/2019 1745 by Glenard Haring, RN Outcome: Progressing 03/26/2019 1203 by Glenard Haring, RN Outcome: Progressing Goal: Diagnostic test results will improve 03/26/2019 1745 by Glenard Haring, RN Outcome: Progressing 03/26/2019 1203 by Glenard Haring, RN Outcome: Progressing Goal: Respiratory complications will improve 03/26/2019 1745 by Glenard Haring, RN Outcome: Progressing 03/26/2019 1203 by Glenard Haring, RN Outcome: Progressing Goal: Cardiovascular complication will be avoided 03/26/2019 1745 by Glenard Haring, RN Outcome: Progressing 03/26/2019 1203 by Glenard Haring, RN Outcome: Progressing   Problem: Activity: Goal: Risk for activity intolerance will decrease 03/26/2019 1745 by Glenard Haring, RN Outcome: Progressing 03/26/2019 1203 by Glenard Haring, RN Outcome: Progressing   Problem: Nutrition: Goal: Adequate nutrition will be maintained 03/26/2019 1745 by Glenard Haring, RN Outcome: Progressing 03/26/2019 1203 by Glenard Haring, RN Outcome: Progressing   Problem: Coping: Goal: Level of anxiety will decrease 03/26/2019  1745 by Glenard Haring, RN Outcome: Progressing 03/26/2019 1203 by Glenard Haring, RN Outcome: Progressing   Problem: Elimination: Goal: Will not experience complications related to bowel motility 03/26/2019 1745 by Glenard Haring, RN Outcome: Progressing 03/26/2019 1203 by Glenard Haring, RN Outcome: Progressing Goal: Will not experience complications related to urinary retention 03/26/2019 1745 by Glenard Haring, RN Outcome: Progressing 03/26/2019 1203 by Glenard Haring, RN Outcome: Progressing   Problem: Pain Managment: Goal: General experience of comfort will improve 03/26/2019 1745 by Glenard Haring, RN Outcome: Progressing 03/26/2019 1203 by Glenard Haring, RN Outcome: Progressing   Problem: Safety: Goal: Ability to remain free from injury will improve 03/26/2019 1745 by Glenard Haring, RN Outcome: Progressing 03/26/2019 1203 by Glenard Haring, RN Outcome: Progressing   Problem: Skin Integrity: Goal: Risk for impaired skin integrity will decrease 03/26/2019 1745 by Glenard Haring, RN Outcome: Progressing 03/26/2019 1203 by Glenard Haring, RN Outcome: Progressing

## 2019-03-26 NOTE — Progress Notes (Signed)
PROGRESS NOTE    Jacob Hampton  JSH:[id number] DOB: [birth year] DOA: 03/24/2019 PCP: Patient, No Pcp Per     Brief Narrative:  Jacob Hampton is a 86 y.o. male with medical history significant for nephrolithiasis followed by urology, coronary artery disease, atrial flutter not anticoagulated due to history of life-threatening GI bleed, hypertension, and emphysema, presenting to the emergency department with loss of appetite, weight loss, and generalized weakness, and also noted some pain in the right lateral chest and flank.  Patient has poor appetite and general weakness has apparently been progressing over months to a year or more. He has had pain in the past associated with kidney stones, and had a similar pain on the right for the past couple days.  He has not felt particularly short of breath but has also not been active in the past couple weeks.  He denied any significant cough, fevers, or chills.  In the emergency department chest x-ray was concerning for moderate sized pneumothorax on the right.  Chest tube was placed in the emergency department and cardiothoracic surgery consulted to manage chest tube.  New events last 24 hours / Subjective: States that his breathing is still short, denies any pain  Assessment & Plan:   Principal Problem:   Pneumothorax on right Active Problems:   CAD (coronary artery disease)   COPD GOLD I    Atrial flutter (HCC)   Pressure injury of skin   CKD (chronic kidney disease), stage II   Protein-calorie malnutrition, severe    Spontaneous pneumothorax on right -Likely secondary to COPD -Chest tube placed by EDP 3/8 -Cardiothoracic surgery following for chest tube management -Remains on room air  COPD -Stable, no wheezing at this time  CAD, CABG x2 -Continue ASA, Plavix, and Lipitor  Atrial flutter -Currently in sinus rhythm, not on anticoagulation due to history of GI bleed   Stage 2 chronic kidney disease  -Stable, renally dose  medications  Severe protein calorie malnutrition -Dietitian consulted, supplements as tolerated  Hypokalemia -Replace, trend   In agreement with assessment of the pressure ulcer as below:  Pressure Injury 03/25/19 Buttocks Mid Stage 2 -  Partial thickness loss of dermis presenting as a shallow open injury with a red, pink wound bed without slough. small open area 1st layer skin off (Active)  03/25/19 2100  Location: Buttocks  Location Orientation: Mid  Staging: Stage 2 -  Partial thickness loss of dermis presenting as a shallow open injury with a red, pink wound bed without slough.  Wound Description (Comments): small open area 1st layer skin off  Present on Admission: Yes      DVT prophylaxis: SCD Code Status: Full code Family Communication: None at bedside, attempted to call and update daughter but could not reach x2 Disposition Plan:  . Patient is from home prior to admission. . Currently in-hospital treatment needed due to pneumothorax with chest tube in place. . Suspect patient will discharge to home with home health in 1 to 2 days   Consultants:   Cardiothoracic surgery  Procedures:   Chest tube placement in the emergency department 3/9  Antimicrobials:  Anti-infectives (From admission, onward)   None        Objective: Vitals:   03/25/19 2100 03/26/19 0427 03/26/19 0822 03/26/19 1100  BP:  (!) 98/57 (!) 96/54 96/66  Pulse: 82 95 85 91  Resp: 16 15 (!) 22 20  Temp:  97.8 F (36.6 C) 97.6 F (36.4 C) 97.6 F (36.4 C)  TempSrc:  Oral Oral Oral  SpO2: 97% 100% 98% 100%  Weight:      Height:        Intake/Output Summary (Last 24 hours) at 03/26/2019 1322 Last data filed at 03/26/2019 0900 Gross per 24 hour  Intake 240 ml  Output 580 ml  Net -340 ml   Filed Weights   03/24/19 1807  Weight: 45.5 kg    Examination:  General exam: Appears calm and comfortable, cachetic and frail  Respiratory system: Clear to auscultation. Respiratory effort  normal. No respiratory distress. No conversational dyspnea. On room air  Cardiovascular system: S1 & S2 heard, RRR. No murmurs. No pedal edema. Gastrointestinal system: Abdomen is nondistended, soft and nontender. Normal bowel sounds heard.  Central nervous system: Alert and oriented. No focal neurological deficits. Speech clear.  Extremities: Symmetric in appearance  Skin: No rashes, lesions or ulcers on exposed skin  Psychiatry: Stable   Data Reviewed: I have personally reviewed following labs and imaging studies  CBC: Recent Labs  Lab 03/24/19 1836 03/25/19 0526 03/26/19 0235  WBC 10.6* 9.2 8.4  NEUTROABS 8.2*  --   --   HGB 12.4* 10.8* 10.8*  HCT 39.0 33.4* 33.6*  MCV 108.6* 107.1* 107.3*  PLT 284 251 782   Basic Metabolic Panel: Recent Labs  Lab 03/24/19 1836 03/25/19 0526 03/26/19 0235  NA 136 141 142  K 3.5 3.6 2.9*  CL 96* 102 105  CO2 29 27 28   GLUCOSE 128* 95 156*  BUN 30* 27* 28*  CREATININE 1.04 1.17 1.02  CALCIUM 10.9* 10.7* 10.5*   GFR: Estimated Creatinine Clearance: 29.1 mL/min (by C-G formula based on SCr of 1.02 mg/dL). Liver Function Tests: Recent Labs  Lab 03/24/19 1836 03/25/19 0526  AST 46* 36  ALT 30 25  ALKPHOS 75 60  BILITOT 1.4* 1.3*  PROT 7.3 6.1*  ALBUMIN 2.9* 2.4*   No results for input(s): LIPASE, AMYLASE in the last 168 hours. No results for input(s): AMMONIA in the last 168 hours. Coagulation Profile: No results for input(s): INR, PROTIME in the last 168 hours. Cardiac Enzymes: No results for input(s): CKTOTAL, CKMB, CKMBINDEX, TROPONINI in the last 168 hours. BNP (last 3 results) No results for input(s): PROBNP in the last 8760 hours. HbA1C: No results for input(s): HGBA1C in the last 72 hours. CBG: No results for input(s): GLUCAP in the last 168 hours. Lipid Profile: No results for input(s): CHOL, HDL, LDLCALC, TRIG, CHOLHDL, LDLDIRECT in the last 72 hours. Thyroid Function Tests: No results for input(s): TSH,  T4TOTAL, FREET4, T3FREE, THYROIDAB in the last 72 hours. Anemia Panel: No results for input(s): VITAMINB12, FOLATE, FERRITIN, TIBC, IRON, RETICCTPCT in the last 72 hours. Sepsis Labs: No results for input(s): PROCALCITON, LATICACIDVEN in the last 168 hours.  Recent Results (from the past 240 hour(s))  SARS CORONAVIRUS 2 (TAT 6-24 HRS) Nasopharyngeal Nasopharyngeal Swab     Status: None   Collection Time: 03/24/19  8:16 PM   Specimen: Nasopharyngeal Swab  Result Value Ref Range Status   SARS Coronavirus 2 NEGATIVE NEGATIVE Final    Comment: (NOTE) SARS-CoV-2 target nucleic acids are NOT DETECTED. The SARS-CoV-2 RNA is generally detectable in upper and lower respiratory specimens during the acute phase of infection. Negative results do not preclude SARS-CoV-2 infection, do not rule out co-infections with other pathogens, and should not be used as the sole basis for treatment or other patient management decisions. Negative results must be combined with clinical observations, patient history, and epidemiological information. The expected  result is Negative. Fact Sheet for Patients: SugarRoll.be Fact Sheet for Healthcare Providers: https://www.woods-mathews.com/ This test is not yet approved or cleared by the [place] FDA and  has been authorized for detection and/or diagnosis of SARS-CoV-2 by FDA under an Emergency Use Authorization (EUA). This EUA will remain  in effect (meaning this test can be used) for the duration of the COVID-19 declaration under Section 56 4(b)(1) of the Act, 21 U.S.C. section 360bbb-3(b)(1), unless the authorization is terminated or revoked sooner. Performed at [facility], [place] [address] [place], [place] [address]       Radiology Studies: DG Sacrum/Coccyx  Result Date: 03/24/2019 CLINICAL DATA:  Pain status post fall. EXAM: SACRUM AND COCCYX - 2+ VIEW COMPARISON:  12/30/2017 FINDINGS: There is no  evidence of fracture or other focal bone lesions. Vascular calcifications are noted. There is a large amount of stool at the level of the rectum. Mild-to-moderate degenerative changes are noted of both hips. There is diffuse osteopenia which limits detection of nondisplaced fractures. IMPRESSION: 1. No acute displaced fracture, however evaluation is limited by diffuse osteopenia. 2. Degenerative changes of both hips. 3. Large amount of stool at the level of the rectum. Electronically Signed   By: Constance Holster M.D.   On: 03/24/2019 19:22   DG Chest Port 1 View  Result Date: 03/26/2019 CLINICAL DATA:  Follow-up pneumothorax. EXAM: PORTABLE CHEST 1 VIEW COMPARISON:  Chest x-ray 03/25/2019 FINDINGS: Stable right basilar chest tube. Small stable residual apical pneumothorax. The cardiac silhouette, mediastinal and hilar contours are stable. Persistent small left pleural effusion and overlying atelectasis. IMPRESSION: Stable right basilar chest tube with a small residual apical pneumothorax. Stable small left effusion and overlying atelectasis. Electronically Signed   By: Marijo Sanes M.D.   On: 03/26/2019 08:44   DG CHEST PORT 1 VIEW  Result Date: 03/25/2019 CLINICAL DATA:  Chest tube present. EXAM: PORTABLE CHEST 1 VIEW COMPARISON:  March 24, 2019 FINDINGS: The right-sided chest tube projects over the medial right base, in stable position. There is a small lateral pneumothorax near the apex measuring 7 mm, unchanged in the interval. The right lung is clear. Mild atelectasis in the left base with a small associated effusion has improved. The cardiomediastinal silhouette is stable. IMPRESSION: 1. Stable right chest tube. Stable small right lateral apical pneumothorax unchanged measuring 7 mm. 2. There is a small effusion with underlying atelectasis in the left base which is mildly improved. Electronically Signed   By: Dorise Bullion III M.D   On: 03/25/2019 09:59   DG Chest Portable 1 View  Result Date:  03/24/2019 CLINICAL DATA:  Check chest tube placement EXAM: PORTABLE CHEST 1 VIEW COMPARISON:  03/24/2019 FINDINGS: Pigtail chest tube catheter is now seen in the right base. Previously seen right-sided pneumothorax has resolved. Blunting of left costophrenic angle is noted stable from the prior exam. Postsurgical changes are again seen. IMPRESSION: Resolution of previously seen right-sided pneumothorax. The remainder of the exam is stable. Electronically Signed   By: Inez Catalina M.D.   On: 03/24/2019 20:41   DG Chest Portable 1 View  Result Date: 03/24/2019 CLINICAL DATA:  Generalized weakness. EXAM: PORTABLE CHEST 1 VIEW COMPARISON:  12/17/2018 FINDINGS: There is persistent blunting of the left hemidiaphragm. There is an apparent moderate size right-sided pneumothorax. The patient is status post prior median sternotomy. There is no significant pleural effusion. Aortic calcifications are noted. The heart size is stable. There is no acute osseous abnormality detected on this study.  IMPRESSION: 1. Moderate-sized right-sided pneumothorax. 2. Otherwise, no significant interval change from prior study. These results were called by telephone at the time of interpretation on 03/24/2019 at 7:02 pm to provider Sherwood Gambler , who verbally acknowledged these results. Electronically Signed   By: Constance Holster M.D.   On: 03/24/2019 19:04      Scheduled Meds: . aspirin EC  81 mg Oral QHS  . atorvastatin  80 mg Oral QHS  . clopidogrel  75 mg Oral QHS  . feeding supplement  1 Container Oral TID BM  . folic acid  1 mg Oral QHS  . multivitamin with minerals  1 tablet Oral Daily  . potassium chloride  40 mEq Oral Q4H   Continuous Infusions:   LOS: 2 days      Time spent: 35 minutes   Dessa Phi, DO [facility] 03/26/2019, 1:22 PM   Available via Epic secure chat 7am-7pm After these hours, please refer to coverage provider listed on [email or web address]

## 2019-03-26 NOTE — Progress Notes (Signed)
Discharge information.  Per the family of Jacob Hampton, he does not need any DME for discharge.

## 2019-03-26 NOTE — Progress Notes (Signed)
OT Cancellation Note  Patient Details Name: Jacob Hampton MRN: [id number] DOB: 12-07-1924   Cancelled Treatment:    Reason Eval/Treat Not Completed: Other (comment)(Daughter in room declining therapy at this time.)  Pt's daughter stating "he has had a very busy day and I realize that he needs to get up, but I want him to rest at this time." OT describing benefits of  Early therapy intervention, but therapy politely declined.   OT to continue to follow for OT evaluation.  Jefferey Pica, OTR/L Acute Rehabilitation Services Pager: [phone] Office: [phone]   Jefferey Pica 03/26/2019, 4:47 PM

## 2019-03-27 ENCOUNTER — Inpatient Hospital Stay (HOSPITAL_COMMUNITY): Payer: Medicare Other

## 2019-03-27 LAB — BASIC METABOLIC PANEL
Anion gap: 7 (ref 5–15)
BUN: 26 mg/dL — ABNORMAL HIGH (ref 8–23)
CO2: 30 mmol/L (ref 22–32)
Calcium: 10.8 mg/dL — ABNORMAL HIGH (ref 8.9–10.3)
Chloride: 106 mmol/L (ref 98–111)
Creatinine, Ser: 1.04 mg/dL (ref 0.61–1.24)
GFR calc Af Amer: >60 mL/min (ref >60)
GFR calc non Af Amer: >60 mL/min (ref >60)
Glucose, Bld: 108 mg/dL — ABNORMAL HIGH (ref 70–99)
Potassium: 4.4 mmol/L (ref 3.5–5.1)
Sodium: 143 mmol/L (ref 135–145)

## 2019-03-27 LAB — MAGNESIUM: Magnesium: 1.9 mg/dL (ref 1.7–2.4)

## 2019-03-27 NOTE — Progress Notes (Signed)
Spoke with pts daughter about the status of the pt. Pt awake and alert. I let her know that the pt was stable and his chest tube was discontinued earlier today. Dressing intact with no drainage present. Possible d/c in the morning. Will continue to monitor.

## 2019-03-27 NOTE — Progress Notes (Signed)
Per order patient pigtail off and pt/daughter aware to call for any changes in breathing or shortness of breath. Vital signs checked every 30 minutes for 3 hours. Pt without any issues. Portable chest xray reviewed by PA. Pt resting with call bell within reach.  Will continue to monitor.

## 2019-03-27 NOTE — Evaluation (Signed)
Occupational Therapy Evaluation Patient Details Name: Jacob Hampton MRN: [id number] DOB: 10-Jul-1924 Today's Date: 03/27/2019    History of Present Illness Pt is a 86 y/o male admitted secondary to weakness and R sided chest pain. Found to have R pneumothorax and is s/p chest tube insertion. PMH includes CAD s/p CABG, COPD, a flutter, and CKD.   Clinical Impression   Pt was assisted for ADL and IADL. He ambulated with supervision and a standard walker and could feed and toilet himself. Pt presents with generalized weakness, soft BP without symptoms and impaired balance. He has baseline memory deficits. Will follow acutely. Recommend home with his family's 24 hour care as PTA.    Follow Up Recommendations  No OT follow up;Supervision/Assistance - 24 hour    Equipment Recommendations  (RW)    Recommendations for Other Services       Precautions / Restrictions Precautions Precautions: Fall Precaution Comments: R chest tube, sacral wound Restrictions Weight Bearing Restrictions: No      Mobility Bed Mobility Overal bed mobility: Needs Assistance Bed Mobility: Supine to Sit     Supine to sit: Mod assist     General bed mobility comments: mod assist to raise trunk and position hips at EOB  Transfers Overall transfer level: Needs assistance Equipment used: Rolling walker (2 wheeled) Transfers: Sit to/from [facility] Sit to Stand: Min assist Stand pivot transfers: Min assist       General transfer comment: cues for hand placement, assist to rise and steady    Balance Overall balance assessment: Needs assistance Sitting-balance support: No upper extremity supported Sitting balance-Leahy Scale: Good     Standing balance support: Bilateral upper extremity supported Standing balance-Leahy Scale: Poor                             ADL either performed or assessed with clinical judgement   ADL Overall ADL's : At baseline                                             Vision Baseline Vision/History: No visual deficits Patient Visual Report: No change from baseline       Perception     Praxis      Pertinent Vitals/Pain Pain Assessment: Faces Faces Pain Scale: Hurts a little bit Pain Location: chest tube site Pain Descriptors / Indicators: Sore Pain Intervention(s): Monitored during session     Hand Dominance Right   Extremity/Trunk Assessment Upper Extremity Assessment Upper Extremity Assessment: Generalized weakness   Lower Extremity Assessment Lower Extremity Assessment: Defer to PT evaluation   Cervical / Trunk Assessment Cervical / Trunk Assessment: Other exceptions;Kyphotic Cervical / Trunk Exceptions: sacral wound from home   Communication Communication Communication: HOH   Cognition Arousal/Alertness: Awake/alert Behavior During Therapy: WFL for tasks assessed/performed Overall Cognitive Status: History of cognitive impairments - at baseline                                 General Comments: pt with memory impairment at baseline   General Comments       Exercises     Shoulder Instructions      Home Living Family/patient expects to be discharged to:: Private residence Living Arrangements: Children Available Help at Discharge: Available 24 hours/day;Family Type  of Home: House Home Access: Stairs to enter [employer] of Steps: 2   Home Layout: One level     Bathroom Shower/Tub: Teacher, early years/pre: Standard     Home Equipment: Bedside commode;Shower seat;Walker - standard          Prior Functioning/Environment Level of Independence: Needs assistance  Gait / Transfers Assistance Needed: Requires assist for bed mobility and requires supervision to walk with standard walker to and from bathroom. Only walks to//from bathroom most of the time.  ADL's / Homemaking Assistance Needed: assisted for ADL and IADL            OT  Problem List: Decreased strength;Impaired balance (sitting and/or standing)      OT Treatment/Interventions: Self-care/ADL training;DME and/or AE instruction;Patient/family education    OT Goals(Current goals can be found in the care plan section) Acute Rehab OT Goals Patient Stated Goal: to go home OT Goal Formulation: With patient Time For Goal Achievement: 04/10/19 Potential to Achieve Goals: Good ADL Goals Pt Will Perform Grooming: with supervision;standing Pt Will Transfer to Toilet: with supervision;ambulating;bedside commode(over toilet) Pt Will Perform Toileting - Clothing Manipulation and hygiene: with supervision;sit to/from stand  OT Frequency: Min 2X/week   Barriers to D/C:            Co-evaluation              AM-PAC OT "6 Clicks" Daily Activity     Outcome Measure Help from another person eating meals?: None Help from another person taking care of personal grooming?: A Little Help from another person toileting, which includes using toliet, bedpan, or urinal?: A Lot Help from another person bathing (including washing, rinsing, drying)?: A Lot Help from another person to put on and taking off regular upper body clothing?: A Little Help from another person to put on and taking off regular lower body clothing?: Total 6 Click Score: 15   End of Session Equipment Utilized During Treatment: Rolling walker;Gait belt Nurse Communication: (needs sacral dressing changed)  Activity Tolerance: Patient tolerated treatment well Patient left: in chair;with call bell/phone within reach;with family/visitor present  OT Visit Diagnosis: Pain;Other abnormalities of gait and mobility (R26.89)                Time: 2563-8937 OT Time Calculation (min): 37 min Charges:  OT General Charges $OT Visit: 1 Visit OT Evaluation $OT Eval Moderate Complexity: 1 Mod OT Treatments $Self Care/Home Management : 8-22 mins  Nestor Lewandowsky, OTR/L Acute Rehabilitation Services Pager:  [phone] Office: [phone] Malka So 03/27/2019, 11:39 AM

## 2019-03-27 NOTE — Progress Notes (Addendum)
PROGRESS NOTE    Jacob Hampton  NAT:[id number] DOB: 1924-04-10 DOA: 03/24/2019 PCP: Patient, No Pcp Per     Brief Narrative:  Jacob Hampton is a 86 y.o. male with medical history significant for nephrolithiasis followed by urology, coronary artery disease, atrial flutter not anticoagulated due to history of life-threatening GI bleed, hypertension, and emphysema, presenting to the emergency department with loss of appetite, weight loss, and generalized weakness, and also noted some pain in the right lateral chest and flank.  Patient has poor appetite and general weakness has apparently been progressing over months to a year or more. He has had pain in the past associated with kidney stones, and had a similar pain on the right for the past couple days.  He has not felt particularly short of breath but has also not been active in the past couple weeks.  He denied any significant cough, fevers, or chills.  In the emergency department chest x-ray was concerning for moderate sized pneumothorax on the right.  Chest tube was placed in the emergency department and cardiothoracic surgery consulted to manage chest tube.  New events last 24 hours / Subjective: No complaints this morning.  Denies any shortness of breath.  Assessment & Plan:   Principal Problem:   Pneumothorax on right Active Problems:   CAD (coronary artery disease)   COPD GOLD I    Atrial flutter (HCC)   Pressure injury of skin   CKD (chronic kidney disease), stage II   Protein-calorie malnutrition, severe    Spontaneous pneumothorax on right -Likely secondary to COPD -Chest tube placed by EDP 3/8 -Cardiothoracic surgery following for chest tube management; planning to clamp chest tube for 3 hours, repeat chest x-ray and likely remove chest tube if no change in small residual pneumothorax -Remains on room air  COPD -Stable, no wheezing at this time  CAD, CABG x2 -Continue ASA, Plavix, and Lipitor  Atrial  flutter -Currently in sinus rhythm, not on anticoagulation due to history of GI bleed   Stage 2 chronic kidney disease  -Stable, renally dose medications  Severe protein calorie malnutrition -Dietitian consulted, supplements as tolerated    In agreement with assessment of the pressure ulcer as below:  Pressure Injury 03/25/19 Buttocks Mid Stage 2 -  Partial thickness loss of dermis presenting as a shallow open injury with a red, pink wound bed without slough. small open area 1st layer skin off (Active)  03/25/19 2100  Location: Buttocks  Location Orientation: Mid  Staging: Stage 2 -  Partial thickness loss of dermis presenting as a shallow open injury with a red, pink wound bed without slough.  Wound Description (Comments): small open area 1st layer skin off  Present on Admission: Yes      DVT prophylaxis: SCD Code Status: Full code Family Communication: None at bedside, called and discussed with daughter over the phone  Disposition Plan:  . Patient is from home prior to admission. . Currently in-hospital treatment needed due to pneumothorax with chest tube in place. . Suspect patient will discharge to home with home health hopefully tomorrow if stable once chest tube removed    Consultants:   Cardiothoracic surgery  Procedures:   Chest tube placement in the emergency department 3/9  Antimicrobials:  Anti-infectives (From admission, onward)   None       Objective: Vitals:   03/26/19 2106 03/27/19 0429 03/27/19 0900 03/27/19 0921  BP: (!) 129/103 (!) 91/53  (!) 86/51  Pulse: 82 86 82 84  Resp: 20  15 (!) 23 (!) 21  Temp: 97.8 F (36.6 C) 97.9 F (36.6 C)  97.8 F (36.6 C)  TempSrc: Oral Oral  Oral  SpO2: 99% 98% 98% 99%  Weight:      Height:        Intake/Output Summary (Last 24 hours) at 03/27/2019 1124 Last data filed at 03/27/2019 0900 Gross per 24 hour  Intake 940 ml  Output 500 ml  Net 440 ml   Filed Weights   03/24/19 1807  Weight: 45.5 kg     Examination: General exam: Appears calm and comfortable, cachectic Respiratory system: Clear to auscultation. Respiratory effort normal. On room air Cardiovascular system: S1 & S2 heard, RRR. No pedal edema. Gastrointestinal system: Abdomen is nondistended, soft and nontender. Normal bowel sounds heard. Central nervous system: Alert and oriented. Non focal exam. Speech clear  Extremities: Symmetric in appearance bilaterally  Skin: No rashes, lesions or ulcers on exposed skin  Psychiatry: Judgement and insight appear stable. Mood & affect appropriate.    Data Reviewed: I have personally reviewed following labs and imaging studies  CBC: Recent Labs  Lab 03/24/19 1836 03/25/19 0526 03/26/19 0235  WBC 10.6* 9.2 8.4  NEUTROABS 8.2*  --   --   HGB 12.4* 10.8* 10.8*  HCT 39.0 33.4* 33.6*  MCV 108.6* 107.1* 107.3*  PLT 284 251 097   Basic Metabolic Panel: Recent Labs  Lab 03/24/19 1836 03/25/19 0526 03/26/19 0235 03/27/19 0309  NA 136 141 142 143  K 3.5 3.6 2.9* 4.4  CL 96* 102 105 106  CO2 29 27 28 30   GLUCOSE 128* 95 156* 108*  BUN 30* 27* 28* 26*  CREATININE 1.04 1.17 1.02 1.04  CALCIUM 10.9* 10.7* 10.5* 10.8*  MG  --   --   --  1.9   GFR: Estimated Creatinine Clearance: 28.6 mL/min (by C-G formula based on SCr of 1.04 mg/dL). Liver Function Tests: Recent Labs  Lab 03/24/19 1836 03/25/19 0526  AST 46* 36  ALT 30 25  ALKPHOS 75 60  BILITOT 1.4* 1.3*  PROT 7.3 6.1*  ALBUMIN 2.9* 2.4*   No results for input(s): LIPASE, AMYLASE in the last 168 hours. No results for input(s): AMMONIA in the last 168 hours. Coagulation Profile: No results for input(s): INR, PROTIME in the last 168 hours. Cardiac Enzymes: No results for input(s): CKTOTAL, CKMB, CKMBINDEX, TROPONINI in the last 168 hours. BNP (last 3 results) No results for input(s): PROBNP in the last 8760 hours. HbA1C: No results for input(s): HGBA1C in the last 72 hours. CBG: No results for input(s):  GLUCAP in the last 168 hours. Lipid Profile: No results for input(s): CHOL, HDL, LDLCALC, TRIG, CHOLHDL, LDLDIRECT in the last 72 hours. Thyroid Function Tests: No results for input(s): TSH, T4TOTAL, FREET4, T3FREE, THYROIDAB in the last 72 hours. Anemia Panel: No results for input(s): VITAMINB12, FOLATE, FERRITIN, TIBC, IRON, RETICCTPCT in the last 72 hours. Sepsis Labs: No results for input(s): PROCALCITON, LATICACIDVEN in the last 168 hours.  Recent Results (from the past 240 hour(s))  SARS CORONAVIRUS 2 (TAT 6-24 HRS) Nasopharyngeal Nasopharyngeal Swab     Status: None   Collection Time: 03/24/19  8:16 PM   Specimen: Nasopharyngeal Swab  Result Value Ref Range Status   SARS Coronavirus 2 NEGATIVE NEGATIVE Final    Comment: (NOTE) SARS-CoV-2 target nucleic acids are NOT DETECTED. The SARS-CoV-2 RNA is generally detectable in upper and lower respiratory specimens during the acute phase of infection. Negative results do not preclude SARS-CoV-2  infection, do not rule out co-infections with other pathogens, and should not be used as the sole basis for treatment or other patient management decisions. Negative results must be combined with clinical observations, patient history, and epidemiological information. The expected result is Negative. Fact Sheet for Patients: SugarRoll.be Fact Sheet for Healthcare Providers: https://www.woods-mathews.com/ This test is not yet approved or cleared by the [place] FDA and  has been authorized for detection and/or diagnosis of SARS-CoV-2 by FDA under an Emergency Use Authorization (EUA). This EUA will remain  in effect (meaning this test can be used) for the duration of the COVID-19 declaration under Section 56 4(b)(1) of the Act, 21 U.S.C. section 360bbb-3(b)(1), unless the authorization is terminated or revoked sooner. Performed at [facility], [address] [place], [place] [address]        Radiology Studies: DG CHEST PORT 1 VIEW  Result Date: 03/27/2019 CLINICAL DATA:  Shortness of breath. Pneumothorax. EXAM: PORTABLE CHEST 1 VIEW COMPARISON:  03/26/2019 FINDINGS: The tiny apical right pneumothorax is stable. Right pleural drain remains in place at the right base. Left base atelectasis with trace effusion is similar. The cardiopericardial silhouette is within normal limits for size. Bones are diffusely demineralized. Telemetry leads overlie the chest. IMPRESSION: Stable tiny right apical pneumothorax with chest tube in situ. Stable left basilar atelectasis with tiny effusion. Electronically Signed   By: Misty Stanley M.D.   On: 03/27/2019 09:48   DG Chest Port 1 View  Result Date: 03/26/2019 CLINICAL DATA:  Follow-up pneumothorax. EXAM: PORTABLE CHEST 1 VIEW COMPARISON:  Chest x-ray 03/25/2019 FINDINGS: Stable right basilar chest tube. Small stable residual apical pneumothorax. The cardiac silhouette, mediastinal and hilar contours are stable. Persistent small left pleural effusion and overlying atelectasis. IMPRESSION: Stable right basilar chest tube with a small residual apical pneumothorax. Stable small left effusion and overlying atelectasis. Electronically Signed   By: Marijo Sanes M.D.   On: 03/26/2019 08:44      Scheduled Meds: . aspirin EC  81 mg Oral QHS  . atorvastatin  80 mg Oral QHS  . clopidogrel  75 mg Oral QHS  . feeding supplement  1 Container Oral TID BM  . folic acid  1 mg Oral QHS  . multivitamin with minerals  1 tablet Oral Daily   Continuous Infusions:   LOS: 3 days      Time spent: 20 minutes   Dessa Phi, DO [facility] 03/27/2019, 11:24 AM   Available via Epic secure chat 7am-7pm After these hours, please refer to coverage provider listed on [email or web address]

## 2019-03-27 NOTE — Progress Notes (Signed)
Removed right pigtail chest tube. Applied occlusive dressing and asked pt to call for any changes in breathing or shortness of breath. Pt aware he needs to let us know if he feels any drainage as well. Pt tolerated procedure well. Pt resting with call bell within reach.  Will continue to monitor.

## 2019-03-27 NOTE — Progress Notes (Signed)
Physical Therapy Treatment Patient Details Name: Jacob Hampton MRN: [id number] DOB: [birth year] Today's Date: 03/27/2019    History of Present Illness Pt is a 86 y/o male admitted secondary to weakness and R sided chest pain. Found to have R pneumothorax and is s/p chest tube insertion. PMH includes CAD s/p CABG, COPD, a flutter, and CKD.    PT Comments    Pt demonstrating good progress today.  Able to ambulate to door and back with RW and min A.  On RA with VSS.  Pt with mild instability requiring min A for balance and min cues for transfer/gait technique.  Cont POC.    Follow Up Recommendations  Home health PT;Supervision/Assistance - 24 hour(HHaide)     Equipment Recommendations  Other (comment)(has DME)    Recommendations for Other Services       Precautions / Restrictions Precautions Precautions: Fall Precaution Comments: R chest tube, sacral wound    Mobility  Bed Mobility Overal bed mobility: Needs Assistance         Sit to supine: Min assist   General bed mobility comments: min assist for bil LE  Transfers Overall transfer level: Needs assistance Equipment used: Rolling walker (2 wheeled) Transfers: Sit to/from Stand Sit to Stand: Min assist         General transfer comment: cues for hand placement, assist to rise and steady; performed x 2  Ambulation/Gait Ambulation/Gait assistance: Min assist Gait Distance (Feet): 35 Feet Assistive device: Rolling walker (2 wheeled) Gait Pattern/deviations: Trunk flexed;Shuffle;Decreased stride length Gait velocity: decreased   General Gait Details: cues for RW proximity and assist with turns; fatigued easily   Stairs             Wheelchair Mobility    Modified Rankin (Stroke Patients Only)       Balance Overall balance assessment: Needs assistance Sitting-balance support: No upper extremity supported Sitting balance-Leahy Scale: Good     Standing balance support: Bilateral upper extremity  supported Standing balance-Leahy Scale: Fair Standing balance comment: able to maintain with RW during ADLs                            Cognition Arousal/Alertness: Awake/alert Behavior During Therapy: WFL for tasks assessed/performed Overall Cognitive Status: History of cognitive impairments - at baseline                                 General Comments: dtr present      Exercises      General Comments General comments (skin integrity, edema, etc.): VSS      Pertinent Vitals/Pain Pain Assessment: No/denies pain    Home Living                      Prior Function            PT Goals (current goals can now be found in the care plan section) Progress towards PT goals: Progressing toward goals    Frequency    Min 3X/week      PT Plan Current plan remains appropriate    Co-evaluation              AM-PAC PT "6 Clicks" Mobility   Outcome Measure  Help needed turning from your back to your side while in a flat bed without using bedrails?: A Little Help needed moving from lying on your back to  sitting on the side of a flat bed without using bedrails?: A Little Help needed moving to and from a bed to a chair (including a wheelchair)?: A Little Help needed standing up from a chair using your arms (e.g., wheelchair or bedside chair)?: A Little Help needed to walk in hospital room?: A Little Help needed climbing 3-5 steps with a railing? : A Little 6 Click Score: 18    End of Session Equipment Utilized During Treatment: Gait belt Activity Tolerance: Patient limited by pain Patient left: in bed;with call bell/phone within reach;with family/visitor present;with bed alarm set Nurse Communication: Mobility status PT Visit Diagnosis: Unsteadiness on feet (R26.81);Muscle weakness (generalized) (M62.81);Difficulty in walking, not elsewhere classified (R26.2)     Time: 7331-2508 PT Time Calculation (min) (ACUTE ONLY): 20  min  Charges:  $Gait Training: 8-22 mins                     Maggie Font, PT Acute Rehab Services Pager [phone] [facility] [phone] [facility] [phone]    Karlton Lemon 03/27/2019, 3:41 PM

## 2019-03-27 NOTE — Progress Notes (Signed)
  Subjective: Resting in bed, awakens easily. No complaints.  O2 sat 99% on RA.    Objective: Vital signs in last 24 hours: Temp:  [97.6 F (36.4 C)-97.9 F (36.6 C)] 97.8 F (36.6 C) (03/11 0921) Pulse Rate:  [82-91] 84 (03/11 0921) Cardiac Rhythm: Normal sinus rhythm;Bundle branch block (03/11 0703) Resp:  [15-23] 21 (03/11 0921) BP: (86-129)/(51-103) 86/51 (03/11 0921) SpO2:  [98 %-100 %] 99 % (03/11 0921)  Hemodynamic parameters for last 24 hours:    Intake/Output from previous day: 03/10 0701 - 03/11 0700 In: 940 [P.O.:940] Out: 500 [Urine:500] Intake/Output this shift: No intake/output data recorded.  General appearance: alert, cooperative, cachectic and no distress Heart: RRR, monitor showing NSR with BBB. Lungs: Breath sounds are clear. No air leak from left pigtail catheter.  CXR shows a samll right apical / lateral PTX with minimal change. The pigtail catheter is projected over the base of the right lower lobe.  The pigtail catheter dressing is dry, all connections are secure. No subQ air.   Lab Results: Recent Labs    03/25/19 0526 03/26/19 0235  WBC 9.2 8.4  HGB 10.8* 10.8*  HCT 33.4* 33.6*  PLT 251 244   BMET:  Recent Labs    03/26/19 0235 03/27/19 0309  NA 142 143  K 2.9* 4.4  CL 105 106  CO2 28 30  GLUCOSE 156* 108*  BUN 28* 26*  CREATININE 1.02 1.04  CALCIUM 10.5* 10.8*    PT/INR: No results for input(s): LABPROT, INR in the last 72 hours. ABG    Component Value Date/Time   PHART 7.404 11/22/2010 2246   HCO3 24.0 11/22/2010 2246   TCO2 21 11/23/2010 1718   ACIDBASEDEF 1.0 11/22/2010 2246   O2SAT 97.0 11/22/2010 2246   CBG (last 3)  No results for input(s): GLUCAP in the last 72 hours.  Assessment/Plan:  S/P right pigtail pleural catheter insertion for a moderate spontaneous PTX in a 86yo with h/o COPD. PTX mostly resolved with no air leak for 48 hours. Will clamp the tube for 3 hours and repeat CXR and likely remove the tube if  no change in small residual PTX.     LOS: 3 days    Antony Odea, PA-C [phone] 03/27/2019

## 2019-03-28 ENCOUNTER — Inpatient Hospital Stay (HOSPITAL_COMMUNITY): Payer: Medicare Other

## 2019-03-28 LAB — BASIC METABOLIC PANEL
Anion gap: 10 (ref 5–15)
BUN: 30 mg/dL — ABNORMAL HIGH (ref 8–23)
CO2: 29 mmol/L (ref 22–32)
Calcium: 10.7 mg/dL — ABNORMAL HIGH (ref 8.9–10.3)
Chloride: 105 mmol/L (ref 98–111)
Creatinine, Ser: 0.86 mg/dL (ref 0.61–1.24)
GFR calc Af Amer: >60 mL/min (ref >60)
GFR calc non Af Amer: >60 mL/min (ref >60)
Glucose, Bld: 96 mg/dL (ref 70–99)
Potassium: 4.2 mmol/L (ref 3.5–5.1)
Sodium: 144 mmol/L (ref 135–145)

## 2019-03-28 NOTE — Progress Notes (Signed)
Physical Therapy Treatment Patient Details Name: Jacob Hampton MRN: [id number] DOB: 1924/03/06 Today's Date: 03/28/2019    History of Present Illness Pt is a 86 y/o male admitted secondary to weakness and R sided chest pain. Found to have R pneumothorax and is s/p chest tube insertion. PMH includes CAD s/p CABG, COPD, a flutter, and CKD.    PT Comments    Patient progressing slowly towards PT goals. Continues to require Min assist for bed mobility, transfers and gait training with use of RW for support. Pt fatigues quickly with short distance ambulation. VSS on RA. Pt reports BLE weakness. Recommend supervision for OOB mobility and walking at home as pt is a high fall risk. Recommend maximizing HH services. Pt reports not owning a RW, will need to see if he has one? Will follow.     Follow Up Recommendations  Home health PT;Supervision/Assistance - 24 hour     Equipment Recommendations  Rolling walker with 5" wheels(unless he has one?)    Recommendations for Other Services       Precautions / Restrictions Precautions Precautions: Fall Precaution Comments: sacral wound Restrictions Weight Bearing Restrictions: No    Mobility  Bed Mobility Overal bed mobility: Needs Assistance Bed Mobility: Supine to Sit     Supine to sit: Min assist;HOB elevated     General bed mobility comments: cues to reach for rail and to elevate trunk to get to EOB.  Transfers Overall transfer level: Needs assistance Equipment used: Rolling walker (2 wheeled) Transfers: Sit to/from Stand Sit to Stand: Min assist         General transfer comment: cues for hand placement, assist to rise and steady; transferred to chair post ambulation.  Ambulation/Gait Ambulation/Gait assistance: Min assist;Min guard Gait Distance (Feet): 30 Feet Assistive device: Rolling walker (2 wheeled) Gait Pattern/deviations: Trunk flexed;Shuffle;Decreased stride length Gait velocity: decreased   General Gait Details:  Slow, mildly unsteady gait with cues for RW proximity; fatigues. BLE weakness. 2/4 DOE. VSS.   Stairs             Wheelchair Mobility    Modified Rankin (Stroke Patients Only)       Balance Overall balance assessment: Needs assistance Sitting-balance support: Feet supported;No upper extremity supported Sitting balance-Leahy Scale: Fair     Standing balance support: During functional activity Standing balance-Leahy Scale: Poor Standing balance comment: Requires UE support in standing.                            Cognition Arousal/Alertness: Awake/alert Behavior During Therapy: WFL for tasks assessed/performed Overall Cognitive Status: History of cognitive impairments - at baseline                                 General Comments: follows commands with increased time.      Exercises      General Comments General comments (skin integrity, edema, etc.): VSS on RA.      Pertinent Vitals/Pain Pain Assessment: No/denies pain    Home Living                      Prior Function            PT Goals (current goals can now be found in the care plan section) Progress towards PT goals: Progressing toward goals(slowly)    Frequency    Min 3X/week  PT Plan Current plan remains appropriate    Co-evaluation              AM-PAC PT "6 Clicks" Mobility   Outcome Measure  Help needed turning from your back to your side while in a flat bed without using bedrails?: A Little Help needed moving from lying on your back to sitting on the side of a flat bed without using bedrails?: A Little Help needed moving to and from a bed to a chair (including a wheelchair)?: A Little Help needed standing up from a chair using your arms (e.g., wheelchair or bedside chair)?: A Little Help needed to walk in hospital room?: A Little Help needed climbing 3-5 steps with a railing? : A Little 6 Click Score: 18    End of Session Equipment  Utilized During Treatment: Gait belt Activity Tolerance: Patient limited by fatigue Patient left: in chair;with call bell/phone within reach;with chair alarm set Nurse Communication: Mobility status PT Visit Diagnosis: Unsteadiness on feet (R26.81);Muscle weakness (generalized) (M62.81);Difficulty in walking, not elsewhere classified (R26.2)     Time: 1610-9604 PT Time Calculation (min) (ACUTE ONLY): 20 min  Charges:  $Gait Training: 8-22 mins                     Marisa Severin, PT, DPT Acute Rehabilitation Services Pager [phone] Office [phone]       [place] 03/28/2019, 11:55 AM

## 2019-03-28 NOTE — TOC Transition Note (Signed)
Transition of Care [facility]) - CM/SW Discharge Note Marvetta Gibbons RN, BSN Transitions of Care Unit 4E- RN Case Manager [phone]   Patient Details  Name: Jacob Hampton MRN: [id number] Date of Birth: 1924-04-22  Transition of Care [facility]) CM/SW Contact:  Dawayne Patricia, RN Phone Number: 03/28/2019, 2:49 PM   Clinical Narrative:    Pt stable for transition home today, from home with son, hx dementia. Daughter at bedside- CM in to speak with her and pt regarding [place] and DME needs- list provided for [facility] choice Per CMS guidelines from medicare.gov website with star ratings (copy placed in shadow chart)- per daughter they would like to use [facility] for [facility] needs, per report from pt and daughter pt has walker at home, no wheels however pt stating that he does not want a walker with wheels. Daughter and son to transport home. Per daughter use [facility] and no difficulty with meds.  Call made to [facility] with [facility] for HHPT referral- referral has been accepted.    Final next level of care: [place] Barriers to Discharge: No Barriers Identified   Patient Goals and CMS Choice Patient states their goals for this hospitalization and ongoing recovery are:: to get home with family CMS Medicare.gov Compare Post Acute Care list provided to:: Patient Represenative (must comment)(daughter) Choice offered to / list presented to : Adult Children  Discharge Placement               Home with [facility]        Discharge Plan and Services   Discharge Planning Services: CM Consult Post Acute Care Choice: Home Health          DME Arranged: N/A DME Agency: NA       HH Arranged: PT [place] Agency: [facility] Date [place]: 03/28/19 Time HH Agency Contacted: 1100 Representative spoke with at [place]: Cassie  Social Determinants of Health ([place]) Interventions     Readmission Risk Interventions No flowsheet data found.

## 2019-03-28 NOTE — Progress Notes (Signed)
Pt/family given discharge instructions, medication lists, follow up appointments, and when to call the doctor.  Pt/family verbalizes understanding. Pt transported to main entrance via wheelchair. Pt needs assistance with ambulation and standing. Per family he has walker at home. [place] Emerald

## 2019-03-28 NOTE — Discharge Summary (Signed)
Physician Discharge Summary  Jacob Hampton TKZ:[id number] DOB: 13-Dec-1924 DOA: 03/24/2019  PCP: Patient, No Pcp Per  Admit date: 03/24/2019 Discharge date: 03/28/2019  Admitted From: Home Disposition:  Home with home health   Recommendations for Outpatient Follow-up:  1. Follow up with PCP in 1 week 2. Follow up with cardiothoracic surgery, Dr. Orvan Seen as needed  Discharge Condition: Stable CODE STATUS: Full code Diet recommendation: Heart healthy  Brief/Interim Summary: Jacob Hampton a 86 y.o.malewith medical history significant fornephrolithiasis followed by urology, coronary artery disease, atrial flutter not anticoagulated due to history of life-threatening GI bleed, hypertension, and emphysema, presenting to the emergency department with loss of appetite, weight loss, and generalized weakness, and also noted some pain in the right lateral chest and flank. Patient has poor appetite and general weakness has apparently been progressing over months to a year or more. He has had pain in the past associated with kidney stones, and had a similar pain on the right for the past couple days. He has not felt particularly short of breath but has also not been active in the past couple weeks. He denied any significant cough, fevers, or chills.  In the emergency department chest x-ray was concerning for moderate sized pneumothorax on the right.  Chest tube was placed in the emergency department and cardiothoracic surgery consulted to manage chest tube.  Patient was observed by cardiothoracic surgery without air leak for 48 hours, chest tube was removed on 3/11 after a successful clamp trial.  No significant increase in the right tiny pneumothorax on chest x-ray in the morning.  Discharge Diagnoses:  Principal Problem:   Pneumothorax on right Active Problems:   CAD (coronary artery disease)   COPD GOLD I    Atrial flutter (HCC)   Pressure injury of skin   CKD (chronic kidney disease), stage II    Protein-calorie malnutrition, severe   Spontaneous pneumothorax on right -Likely secondary to COPD -Chest tube placed by EDP 3/8, removed 3/11 -Remains on room air  COPD -Stable, no wheezing at this time  CAD, CABG x2 -Continue ASA, Plavix, and Lipitor  Atrial flutter -Currently in sinus rhythm, not on anticoagulation due to history of GI bleed  Stage2chronic kidney disease  -Stable, renally dose medications  Severe protein calorie malnutrition -Dietitian consulted, supplements as tolerated   In agreement with assessment of the pressure ulcer as below:  Pressure Injury 12/29/17 Stage II -  Partial thickness loss of dermis presenting as a shallow open ulcer with a red, pink wound bed without slough. (Active)  12/29/17 1845  Location: Sacrum  Location Orientation: Mid  Staging: Stage II -  Partial thickness loss of dermis presenting as a shallow open ulcer with a red, pink wound bed without slough.  Wound Description (Comments):   Present on Admission: Yes     Pressure Injury 03/25/19 Buttocks Mid Stage 2 -  Partial thickness loss of dermis presenting as a shallow open injury with a red, pink wound bed without slough. small open area 1st layer skin off (Active)  03/25/19 2100  Location: Buttocks  Location Orientation: Mid  Staging: Stage 2 -  Partial thickness loss of dermis presenting as a shallow open injury with a red, pink wound bed without slough.  Wound Description (Comments): small open area 1st layer skin off  Present on Admission: Yes    Nutrition Problem: Severe Malnutrition Etiology: chronic illness(COPD)  Discharge Instructions  Discharge Instructions    Call MD for:  difficulty breathing, headache or visual disturbances  Complete by: As directed    Call MD for:  extreme fatigue   Complete by: As directed    Call MD for:  persistant dizziness or light-headedness   Complete by: As directed    Call MD for:  persistant nausea and vomiting    Complete by: As directed    Call MD for:  redness, tenderness, or signs of infection (pain, swelling, redness, odor or green/yellow discharge around incision site)   Complete by: As directed    Call MD for:  severe uncontrolled pain   Complete by: As directed    Call MD for:  temperature >100.4   Complete by: As directed    Diet - low sodium heart healthy   Complete by: As directed    Discharge instructions   Complete by: As directed    You were cared for by a hospitalist during your hospital stay. If you have any questions about your discharge medications or the care you received while you were in the hospital after you are discharged, you can call the unit and ask to speak with the hospitalist on call if the hospitalist that took care of you is not available. Once you are discharged, your primary care physician will handle any further medical issues. Please note that NO REFILLS for any discharge medications will be authorized once you are discharged, as it is imperative that you return to your primary care physician (or establish a relationship with a primary care physician if you do not have one) for your aftercare needs so that they can reassess your need for medications and monitor your lab values.   Discharge wound care:   Complete by: As directed    Leave the right chest pigtail catheter exit site dressing in place until 3/14   Increase activity slowly   Complete by: As directed      Allergies as of 03/28/2019   No Known Allergies     Medication List    TAKE these medications   aspirin EC 81 MG tablet Take 81 mg by mouth at bedtime.   atorvastatin 80 MG tablet Commonly known as: LIPITOR Take 1 tablet (80 mg total) by mouth at bedtime.   clopidogrel 75 MG tablet Commonly known as: PLAVIX Take 1 tablet (75 mg total) by mouth at bedtime.   ezetimibe 10 MG tablet Commonly known as: ZETIA Take 1 tablet (10 mg total) by mouth at bedtime.   feeding supplement (ENSURE ENLIVE)  Liqd Take 237 mLs by mouth 2 (two) times daily between meals.   folic acid 1 MG tablet Commonly known as: FOLVITE TAKE 1 TABLET BY MOUTH AT BEDTIME   nitroGLYCERIN 0.4 MG SL tablet Commonly known as: NITROSTAT Place 0.4 mg under the tongue every 5 (five) minutes as needed for chest pain. For chest pain   Pedialyte Soln Take 240 mLs by mouth daily.            Discharge Care Instructions  (From admission, onward)         Start     Ordered   03/28/19 0000  Discharge wound care:    Comments: Leave the right chest pigtail catheter exit site dressing in place until 3/14   03/28/19 0918         Follow-up Information    Atkins, Glenice Bow, MD Follow up.   Specialty: Cardiothoracic Surgery Why: Follow up as needed Contact information: [address] [place] [address] [phone]  Your PCP in [facility]. Schedule an appointment as soon as possible for a visit in 1 week(s).          No Known Allergies  Consultations:  Cardiothoracic surgery   Procedures/Studies: DG Sacrum/Coccyx  Result Date: 03/24/2019 CLINICAL DATA:  Pain status post fall. EXAM: SACRUM AND COCCYX - 2+ VIEW COMPARISON:  12/30/2017 FINDINGS: There is no evidence of fracture or other focal bone lesions. Vascular calcifications are noted. There is a large amount of stool at the level of the rectum. Mild-to-moderate degenerative changes are noted of both hips. There is diffuse osteopenia which limits detection of nondisplaced fractures. IMPRESSION: 1. No acute displaced fracture, however evaluation is limited by diffuse osteopenia. 2. Degenerative changes of both hips. 3. Large amount of stool at the level of the rectum. Electronically Signed   By: Constance Holster M.D.   On: 03/24/2019 19:22   DG CHEST PORT 1 VIEW  Result Date: 03/28/2019 CLINICAL DATA:  Recent chest tube removal EXAM: PORTABLE CHEST 1 VIEW COMPARISON:  Radiograph 03/27/2019 FINDINGS: A small residual RIGHT  apical pneumothorax remains following chest tube removal. No increase in pneumothorax volume. Pleural edge is approximately 4 mm from chest wall. There is atelectasis in the LEFT lower lung base with elevation of LEFT hemidiaphragm unchanged. IMPRESSION: Small residual RIGHT apical pneumothorax following chest tube removal. No increase in volume of pneumothorax. Electronically Signed   By: Suzy Bouchard M.D.   On: 03/28/2019 09:08   DG CHEST PORT 1 VIEW  Result Date: 03/27/2019 CLINICAL DATA:  Pneumothorax. EXAM: PORTABLE CHEST 1 VIEW COMPARISON:  8:50 2 hours on the same date. FINDINGS: Slight interval decreased size of a right apical small pneumothorax with similar pigtail chest tube placement coiled in the right cardiophrenic angle. No mediastinal shift. Stable cardiomediastinal silhouette, CABG markers, mediastinal and abdominal surgical clips, and intact median sternotomy wires. Chronic hyperinflation and increased interstitial markings with medial right upper lobe bronchiectasis and improved left basilar aeration. No obvious pleural effusion or pneumonia. Diffuse bone demineralization and degenerative change. Telemetry leads. IMPRESSION: Slight interval decreased size of a right apical small pneumothorax with similar chest tube placement. No mediastinal shift. Chronic hyperinflation and thoracic postoperative change with improved left basilar aeration. Electronically Signed   By: Revonda Humphrey   On: 03/27/2019 15:12   DG CHEST PORT 1 VIEW  Result Date: 03/27/2019 CLINICAL DATA:  Shortness of breath. Pneumothorax. EXAM: PORTABLE CHEST 1 VIEW COMPARISON:  03/26/2019 FINDINGS: The tiny apical right pneumothorax is stable. Right pleural drain remains in place at the right base. Left base atelectasis with trace effusion is similar. The cardiopericardial silhouette is within normal limits for size. Bones are diffusely demineralized. Telemetry leads overlie the chest. IMPRESSION: Stable tiny right apical  pneumothorax with chest tube in situ. Stable left basilar atelectasis with tiny effusion. Electronically Signed   By: Misty Stanley M.D.   On: 03/27/2019 09:48   DG Chest Port 1 View  Result Date: 03/26/2019 CLINICAL DATA:  Follow-up pneumothorax. EXAM: PORTABLE CHEST 1 VIEW COMPARISON:  Chest x-ray 03/25/2019 FINDINGS: Stable right basilar chest tube. Small stable residual apical pneumothorax. The cardiac silhouette, mediastinal and hilar contours are stable. Persistent small left pleural effusion and overlying atelectasis. IMPRESSION: Stable right basilar chest tube with a small residual apical pneumothorax. Stable small left effusion and overlying atelectasis. Electronically Signed   By: Marijo Sanes M.D.   On: 03/26/2019 08:44   DG CHEST PORT 1 VIEW  Result Date: 03/25/2019 CLINICAL DATA:  Chest  tube present. EXAM: PORTABLE CHEST 1 VIEW COMPARISON:  March 24, 2019 FINDINGS: The right-sided chest tube projects over the medial right base, in stable position. There is a small lateral pneumothorax near the apex measuring 7 mm, unchanged in the interval. The right lung is clear. Mild atelectasis in the left base with a small associated effusion has improved. The cardiomediastinal silhouette is stable. IMPRESSION: 1. Stable right chest tube. Stable small right lateral apical pneumothorax unchanged measuring 7 mm. 2. There is a small effusion with underlying atelectasis in the left base which is mildly improved. Electronically Signed   By: Dorise Bullion III M.D   On: 03/25/2019 09:59   DG Chest Portable 1 View  Result Date: 03/24/2019 CLINICAL DATA:  Check chest tube placement EXAM: PORTABLE CHEST 1 VIEW COMPARISON:  03/24/2019 FINDINGS: Pigtail chest tube catheter is now seen in the right base. Previously seen right-sided pneumothorax has resolved. Blunting of left costophrenic angle is noted stable from the prior exam. Postsurgical changes are again seen. IMPRESSION: Resolution of previously seen  right-sided pneumothorax. The remainder of the exam is stable. Electronically Signed   By: Inez Catalina M.D.   On: 03/24/2019 20:41   DG Chest Portable 1 View  Result Date: 03/24/2019 CLINICAL DATA:  Generalized weakness. EXAM: PORTABLE CHEST 1 VIEW COMPARISON:  12/17/2018 FINDINGS: There is persistent blunting of the left hemidiaphragm. There is an apparent moderate size right-sided pneumothorax. The patient is status post prior median sternotomy. There is no significant pleural effusion. Aortic calcifications are noted. The heart size is stable. There is no acute osseous abnormality detected on this study. IMPRESSION: 1. Moderate-sized right-sided pneumothorax. 2. Otherwise, no significant interval change from prior study. These results were called by telephone at the time of interpretation on 03/24/2019 at 7:02 pm to provider Sherwood Gambler , who verbally acknowledged these results. Electronically Signed   By: Constance Holster M.D.   On: 03/24/2019 19:04       Discharge Exam: Vitals:   03/27/19 1955 03/28/19 0353  BP: (!) 94/52 (!) 89/44  Pulse: (!) 56 71  Resp: 20 15  Temp: 97.8 F (36.6 C) 97.8 F (36.6 C)  SpO2: 96% 96%    General: Pt is alert, awake, not in acute distress Cardiovascular: Irregular rhythm, normal sinus with PVCs on telemetry, S1/S2 +, no edema Respiratory: CTA bilaterally, no wheezing, no rhonchi, no respiratory distress, no conversational dyspnea  Abdominal: Soft, NT, ND, bowel sounds + Extremities: no edema, no cyanosis Psych: Normal mood and affect, stable judgement and insight     The results of significant diagnostics from this hospitalization (including imaging, microbiology, ancillary and laboratory) are listed below for reference.     Microbiology: Recent Results (from the past 240 hour(s))  SARS CORONAVIRUS 2 (TAT 6-24 HRS) Nasopharyngeal Nasopharyngeal Swab     Status: None   Collection Time: 03/24/19  8:16 PM   Specimen: Nasopharyngeal Swab   Result Value Ref Range Status   SARS Coronavirus 2 NEGATIVE NEGATIVE Final    Comment: (NOTE) SARS-CoV-2 target nucleic acids are NOT DETECTED. The SARS-CoV-2 RNA is generally detectable in upper and lower respiratory specimens during the acute phase of infection. Negative results do not preclude SARS-CoV-2 infection, do not rule out co-infections with other pathogens, and should not be used as the sole basis for treatment or other patient management decisions. Negative results must be combined with clinical observations, patient history, and epidemiological information. The expected result is Negative. Fact Sheet for Patients: SugarRoll.be Fact Sheet  for Healthcare Providers: https://www.woods-mathews.com/ This test is not yet approved or cleared by the [place] and  has been authorized for detection and/or diagnosis of SARS-CoV-2 by FDA under an Emergency Use Authorization (EUA). This EUA will remain  in effect (meaning this test can be used) for the duration of the COVID-19 declaration under Section 56 4(b)(1) of the Act, 21 U.S.C. section 360bbb-3(b)(1), unless the authorization is terminated or revoked sooner. Performed at [facility], [place] [address], [place], [place] [address]      Labs: BNP (last 3 results) No results for input(s): BNP in the last 8760 hours. Basic Metabolic Panel: Recent Labs  Lab 03/24/19 1836 03/25/19 0526 03/26/19 0235 03/27/19 0309 03/28/19 0302  NA 136 141 142 143 144  K 3.5 3.6 2.9* 4.4 4.2  CL 96* 102 105 106 105  CO2 29 27 28 30 29   GLUCOSE 128* 95 156* 108* 96  BUN 30* 27* 28* 26* 30*  CREATININE 1.04 1.17 1.02 1.04 0.86  CALCIUM 10.9* 10.7* 10.5* 10.8* 10.7*  MG  --   --   --  1.9  --    Liver Function Tests: Recent Labs  Lab 03/24/19 1836 03/25/19 0526  AST 46* 36  ALT 30 25  ALKPHOS 75 60  BILITOT 1.4* 1.3*  PROT 7.3 6.1*  ALBUMIN 2.9* 2.4*   No results for  input(s): LIPASE, AMYLASE in the last 168 hours. No results for input(s): AMMONIA in the last 168 hours. CBC: Recent Labs  Lab 03/24/19 1836 03/25/19 0526 03/26/19 0235  WBC 10.6* 9.2 8.4  NEUTROABS 8.2*  --   --   HGB 12.4* 10.8* 10.8*  HCT 39.0 33.4* 33.6*  MCV 108.6* 107.1* 107.3*  PLT 284 251 244   Cardiac Enzymes: No results for input(s): CKTOTAL, CKMB, CKMBINDEX, TROPONINI in the last 168 hours. BNP: Invalid input(s): POCBNP CBG: No results for input(s): GLUCAP in the last 168 hours. D-Dimer No results for input(s): DDIMER in the last 72 hours. Hgb A1c No results for input(s): HGBA1C in the last 72 hours. Lipid Profile No results for input(s): CHOL, HDL, LDLCALC, TRIG, CHOLHDL, LDLDIRECT in the last 72 hours. Thyroid function studies No results for input(s): TSH, T4TOTAL, T3FREE, THYROIDAB in the last 72 hours.  Invalid input(s): FREET3 Anemia work up No results for input(s): VITAMINB12, FOLATE, FERRITIN, TIBC, IRON, RETICCTPCT in the last 72 hours. Urinalysis    Component Value Date/Time   COLORURINE YELLOW 12/29/2017 1500   APPEARANCEUR CLOUDY (A) 12/29/2017 1500   LABSPEC 1.025 12/29/2017 1500   PHURINE 6.0 12/29/2017 1500   GLUCOSEU NEGATIVE 12/29/2017 1500   HGBUR LARGE (A) 12/29/2017 1500   BILIRUBINUR NEGATIVE 12/29/2017 1500   KETONESUR NEGATIVE 12/29/2017 1500   PROTEINUR 30 (A) 12/29/2017 1500   UROBILINOGEN 1.0 05/18/2011 1047   NITRITE NEGATIVE 12/29/2017 1500   LEUKOCYTESUR LARGE (A) 12/29/2017 1500   Sepsis Labs Invalid input(s): PROCALCITONIN,  WBC,  LACTICIDVEN Microbiology Recent Results (from the past 240 hour(s))  SARS CORONAVIRUS 2 (TAT 6-24 HRS) Nasopharyngeal Nasopharyngeal Swab     Status: None   Collection Time: 03/24/19  8:16 PM   Specimen: Nasopharyngeal Swab  Result Value Ref Range Status   SARS Coronavirus 2 NEGATIVE NEGATIVE Final    Comment: (NOTE) SARS-CoV-2 target nucleic acids are NOT DETECTED. The SARS-CoV-2 RNA is  generally detectable in upper and lower respiratory specimens during the acute phase of infection. Negative results do not preclude SARS-CoV-2 infection, do not rule out co-infections  with other pathogens, and should not be used as the sole basis for treatment or other patient management decisions. Negative results must be combined with clinical observations, patient history, and epidemiological information. The expected result is Negative. Fact Sheet for Patients: SugarRoll.be Fact Sheet for Healthcare Providers: https://www.woods-mathews.com/ This test is not yet approved or cleared by the [place] FDA and  has been authorized for detection and/or diagnosis of SARS-CoV-2 by FDA under an Emergency Use Authorization (EUA). This EUA will remain  in effect (meaning this test can be used) for the duration of the COVID-19 declaration under Section 56 4(b)(1) of the Act, 21 U.S.C. section 360bbb-3(b)(1), unless the authorization is terminated or revoked sooner. Performed at [facility], [place] [address], [place], [place] [address]      Patient was seen and examined on the day of discharge and was found to be in stable condition. Time coordinating discharge: 25 minutes including assessment and coordination of care, as well as examination of the patient.   SIGNED:  Dessa Phi, DO [facility] 03/28/2019, 9:20 AM

## 2019-03-28 NOTE — Progress Notes (Signed)
  Subjective: Awake and alert, no complaints or concerns from respiratory standpoint.   Objective: Vital signs in last 24 hours: Temp:  [97.5 F (36.4 C)-97.8 F (36.6 C)] 97.8 F (36.6 C) (03/12 0353) Pulse Rate:  [36-92] 71 (03/12 0353) Cardiac Rhythm: Normal sinus rhythm (03/12 0700) Resp:  [15-33] 15 (03/12 0353) BP: (85-114)/(44-60) 89/44 (03/12 0353) SpO2:  [96 %-100 %] 96 % (03/12 0353)  Hemodynamic parameters for last 24 hours:    Intake/Output from previous day: 03/11 0701 - 03/12 0700 In: 480 [P.O.:480] Out: 575 [Urine:575] Intake/Output this shift: No intake/output data recorded.  General appearance: alert, cooperative and no distress Lungs: Breath sounds clear. CXR shows good expansion of both lungs, suspect tiny right apical PTX with no further increase since yesterday.  Wound: There is an occlusive dressing over the pigtail insertion site. No drainage on the gaauze. No sub-Q air.   Lab Results: Recent Labs    03/26/19 0235  WBC 8.4  HGB 10.8*  HCT 33.6*  PLT 244   BMET:  Recent Labs    03/27/19 0309 03/28/19 0302  NA 143 144  K 4.4 4.2  CL 106 105  CO2 30 29  GLUCOSE 108* 96  BUN 26* 30*  CREATININE 1.04 0.86  CALCIUM 10.8* 10.7*    PT/INR: No results for input(s): LABPROT, INR in the last 72 hours. ABG    Component Value Date/Time   PHART 7.404 11/22/2010 2246   HCO3 24.0 11/22/2010 2246   TCO2 21 11/23/2010 1718   ACIDBASEDEF 1.0 11/22/2010 2246   O2SAT 97.0 11/22/2010 2246   CBG (last 3)  No results for input(s): GLUCAP in the last 72 hours.  Assessment/Plan:   -S/P right pigtail pleural catheter insertion for a moderate spontaneous PTX in a 86yo with h/o COPD. Pt was observed with no air leak for 48 hours then the CT was removed yesterday after a successful clamp trial. No significant increase in the tiny right PTX on CXR this AM.  OK to discharge from CT surgery standpoint.  Should leave the right chest pigtail catheter exit  site dressing in place until 3/14. F/U with Dr. Orvan Seen prn.     LOS: 4 days    Antony Odea, PA-C [phone] 03/28/2019

## 2019-04-07 ENCOUNTER — Telehealth: Payer: Self-pay | Admitting: Cardiology

## 2019-04-07 NOTE — Telephone Encounter (Signed)
Patient's daughter in law states she is requesting to have appointment currently scheduled for 04/10/19 rescheduled for an afternoon time slot and changed to a virtual appointment due to the patient being unable to come into the office. Please assist.

## 2019-04-08 NOTE — Telephone Encounter (Signed)
Yes, we can switch to virtual

## 2019-04-09 ENCOUNTER — Telehealth: Payer: Self-pay | Admitting: Cardiology

## 2019-04-09 NOTE — Telephone Encounter (Signed)
I spoke with Jacob Hampton daughter, Raechel Chute, to reschedule his appt. I told her Dr. Agustin Cree would not be in the office tomorrow afternoon, 04/10/19. She asked if her father could be seen virtually on 04/11/19 so I rescheduled his appt for Friday, 04/11/19 at 1:35 PM for a Virtual Office Visit with Dr. Agustin Cree.

## 2019-04-10 ENCOUNTER — Ambulatory Visit: Payer: Medicare Other | Admitting: Cardiology

## 2019-04-10 NOTE — Telephone Encounter (Signed)
Spoke to  [place] per dpr and informed her that the patient's appointment has been switched to virtual. No further questions.

## 2019-04-11 ENCOUNTER — Encounter: Payer: Self-pay | Admitting: Cardiology

## 2019-04-11 ENCOUNTER — Telehealth (INDEPENDENT_AMBULATORY_CARE_PROVIDER_SITE_OTHER): Payer: Medicare Other | Admitting: Cardiology

## 2019-04-11 VITALS — BP 90/52 | HR 82 | Temp 97.8°F | Ht 66.0 in | Wt 100.0 lb

## 2019-04-11 DIAGNOSIS — I2510 Atherosclerotic heart disease of native coronary artery without angina pectoris: Secondary | ICD-10-CM | POA: Diagnosis not present

## 2019-04-11 DIAGNOSIS — Z951 Presence of aortocoronary bypass graft: Secondary | ICD-10-CM

## 2019-04-11 DIAGNOSIS — E782 Mixed hyperlipidemia: Secondary | ICD-10-CM

## 2019-04-11 DIAGNOSIS — E785 Hyperlipidemia, unspecified: Secondary | ICD-10-CM

## 2019-04-11 DIAGNOSIS — Z7189 Other specified counseling: Secondary | ICD-10-CM

## 2019-04-11 DIAGNOSIS — I480 Paroxysmal atrial fibrillation: Secondary | ICD-10-CM | POA: Diagnosis not present

## 2019-04-11 DIAGNOSIS — Z8719 Personal history of other diseases of the digestive system: Secondary | ICD-10-CM

## 2019-04-11 NOTE — Patient Instructions (Signed)

## 2019-04-11 NOTE — Progress Notes (Signed)
Virtual Visit via Telephone Note   This visit type was conducted due to national recommendations for restrictions regarding the COVID-19 Pandemic (e.g. social distancing) in an effort to limit this patient's exposure and mitigate transmission in our community.  Due to his co-morbid illnesses, this patient is at least at moderate risk for complications without adequate follow up.  This format is felt to be most appropriate for this patient at this time.  The patient did not have access to video technology/had technical difficulties with video requiring transitioning to audio format only (telephone).  All issues noted in this document were discussed and addressed.  No physical exam could be performed with this format.  Please refer to the patient's chart for his  consent to telehealth for [facility]  Evaluation Performed:  Follow-up visit  This visit type was conducted due to national recommendations for restrictions regarding the COVID-19 Pandemic (e.g. social distancing).  This format is felt to be most appropriate for this patient at this time.  All issues noted in this document were discussed and addressed.  No physical exam was performed (except for noted visual exam findings with Video Visits).  Please refer to the patient's chart (MyChart message for video visits and phone note for telephone visits) for the patient's consent to telehealth for [facility].  Date:  04/11/2019  ID: Jacob Hampton, DOB 1924-01-26, MRN [id number]   Patient Location: [address] [place] [address]   Provider location:   [place] [facility]  PCP:  Patient, No Pcp Per  Cardiologist:  Jenne Campus, MD     Chief Complaint: I was in the hospital  History of Present Illness:    Jacob Hampton is a 86 y.o. male  who presents via audio/video conferencing for a telehealth visit today.  With complex past medical history which include coronary artery disease, status post coronary bypass  graft done in 2012.  During that surgery he got LIMA to LAD, SVG to RCA, left radial to obtuse marginal cardiac catheterization done in 2012 however showed all venous graft being occluded, paroxysmal atrial fibrillation, not anticoagulated secondary to significant upper GI bleed that he had.  Essential hypertension.  Recently he is being admitted to hospital because of pneumothorax which was continuous.  He required chest tube.  He being sent home about 2 weeks ago and gradually recovering but overall complain of being weak tired and exhausted.  He is unable to establish video link, therefore, with only talk over the phone.  His it with his daughter-in-law who is caring majority of conversation.  He denies having any pain he does have some shortness of breath doing a even minimal exercise.  There is no swelling of lower extremities, he sleeps quite well.  Denies having any chest pain anymore.  The patient does not have symptoms concerning for COVID-19 infection (fever, chills, cough, or new SHORTNESS OF BREATH).    Prior CV studies:   The following studies were reviewed today:   Records from the emergency room as well as hospitalization reviewed.    Past Medical History:  Diagnosis Date  . Acute lower UTI 12/29/2017  . AKI (acute kidney injury) ([place]) 12/29/2017  . Anemia   . Angina   . Arthritis   . Atrial flutter ([place])   . Blood transfusion    Had 8 units for GI bleed  . CAD (coronary artery disease)    Dr. Wynonia Lawman is his Cardiologist  . Calculus of kidney 06/08/2015  Added automatically from request for surgery [phone]  Formatting of this note might be different from the original. Added automatically from request for surgery [phone]  . Chronic coronary artery disease 11/16/2010   Redo CABG 2012 Dr. Roxy Manns left radial to OM, SVG to RCA CABG 2001 Dr. Roxy Manns.  All SVG occluded 10/12.  LIMA patent PTCA of RCA in 1988 and 1991 Left subclavian aneurysm noted Cath 11/2010 Occluded LAD, severe prox  RCA disease with moderate mid lesion, Severe distal LM disease prior to large OM system Cath 03/01/11  Severe distal LM, occluded LAD and Circ, 95%prox RCA, diffuse mid disease.  LIMA LAD pa  . CKD (chronic kidney disease), stage II 03/25/2019  . COPD GOLD I  07/05/2015   Spirometry 07/05/2015  FEV1 1.61 (86%)  Ratio 60       Formatting of this note might be different from the original. Overview:  Spirometry 07/05/2015  FEV1 1.61 (86%)  Ratio 60    Last Assessment & Plan:  Spirometry 07/05/2015  FEV1 1.61 (86%)  Ratio 60    Remains asymptomatic > no rx needed  . Dehydration 12/29/2017  . Dizzy    "when I take my blood pressure medicine sometimes it makes me a little dizzy"  . GERD (gastroesophageal reflux disease)   . Heart murmur   . History of atrial fibrillation 12/08/2016  . History of CABG 11/16/2010   11/22/2010  Redo CABG with left radial to OM, SVG to RCA Dr. Roxy Manns  2001 LIMA to LAD, SVG to dx, SVG to OM, SVG to PD-PL Dr. Roxy Manns.  Required re exploration for bleeding   Formatting of this note might be different from the original. Overview:  11/22/2010  Redo CABG with left radial to OM, SVG to RCA Dr. Roxy Manns  2001 LIMA to LAD, SVG to dx, SVG to OM, SVG to PD-PL Dr. Roxy Manns.  Required re exploration for   . History of lung cancer 11/17/2017  . History of primary tuberculosis   . History of tuberculosis 11/17/2010  . History of upper GI bleeding    8 units for GI bleed   . Hyperkalemia 12/29/2017  . Hyperlipidemia   . Hypertension   . Hyponatremia 12/29/2017  . Kidney stones   . Left ureteral stone 12/29/2017  . Malignant neoplasm of upper lobe, unspecified bronchus or lung ([place])   . Malnutrition of moderate degree 12/31/2017  . Obstipation 12/29/2017  . Pneumonia    h/o walking pneumonia at least 20 years ago  . Pneumothorax on right 03/24/2019  . Presence of aortocoronary bypass graft   . Presence of coronary angioplasty implant and graft   . Pressure injury of skin 12/29/2017  . Prolonged QT  interval 12/29/2017  . Protein-calorie malnutrition, severe 03/25/2019  . Renal hematoma, left, initial encounter 11/17/2017  . Shortness of breath 05/18/11   "sometimes lying down; sometimes w/activity"  . Sinus drainage    11/24/10 "every morning when I get up I have to clear it out"  . Sleep disorder    "trouble staying asleep"  . Solitary pulmonary nodule 05/20/2015   Incidentally detected 05/09/15 x LLL medially x 51mm  PET 05/27/2015 >  1. Hypermetabolic nodule in the LEFT lower lobe consistent with primary bronchogenic carcinoma. 2. No evidence mediastinal nodal metastasis or distant metastasis  - see ov 07/05/2015 > declined surgical opinion  - 10/05/2015 not viz on plain cxr > neg cxr 04/05/2016  And 07/07/2016   -    CT renal 06/13/17  LLL nodule 2.6 x 2.1 with   . Status post ablation of atrial flutter summer 2019 07/12/2018  . Status post cystoscopy with ureteral stent placement 11/17/2017  . Tuberculosis 1969   Was in sanitorium for 1 year  . Ureteral obstruction, left 12/30/2017  . UTI (urinary tract infection) 11/16/2017  . Ventricular premature depolarization     Past Surgical History:  Procedure Laterality Date  . A-FLUTTER ABLATION N/A 09/12/2017   Procedure: A-FLUTTER ABLATION;  Surgeon: Evans Lance, MD;  Location: [facility];  Service: Cardiovascular;  Laterality: N/A;  . APPENDECTOMY    . bleeding ulcer     w/gallbladder  . CATARACT EXTRACTION W/ INTRAOCULAR LENS IMPLANT     bilaterally  . CHOLECYSTECTOMY     "w/bleeding ulcer OR"  . CORONARY ANGIOPLASTY WITH STENT PLACEMENT  02/2011  . CORONARY ARTERY BYPASS GRAFT  03/25/1999   CABG x5 using LIMA to LAD, SVG to Diag, SVG to OM, sequential SVG to PD and RPL, open SV harvest from left thigh and leg  . CORONARY ARTERY BYPASS GRAFT  11/22/2010   Procedure: REDO CORONARY ARTERY BYPASS GRAFTING (CABG)TIMES TWO;  Surgeon: Rexene Alberts, MD;  Location: [facility];  Service: Open Heart Surgery;  Laterality: N/A;  using  endoscopically harvested right saphenous vein and left radial artery; Transesophageal echocardiogram  . CYSTOSCOPY W/ URETERAL STENT PLACEMENT Left 12/29/2017   Procedure: CYSTOSCOPY WITH RETROGRADE PYELOGRAM/URETERAL STENT EXCHANGE;  Surgeon: Irine Seal, MD;  Location: [facility];  Service: Urology;  Laterality: Left;  . INGUINAL HERNIA REPAIR     ? left  . LEFT HEART CATHETERIZATION WITH CORONARY/GRAFT ANGIOGRAM N/A 03/01/2011   Procedure: LEFT HEART CATHETERIZATION WITH Beatrix Fetters;  Surgeon: Jettie Booze, MD;  [facility] C[facility];  Service: Cardiovascular;  Laterality: N/A;  . LEFT HEART CATHETERIZATION WITH CORONARY/GRAFT ANGIOGRAM N/A 05/19/2011   Procedure: LEFT HEART CATHETERIZATION WITH Beatrix Fetters;  Surgeon: Jettie Booze, MD;  Location: [facility] CATH LAB;  Service: Cardiovascular;  Laterality: N/A;  . LITHOTRIPSY    . OTHER SURGICAL HISTORY  03/25/1999   reexploration for bleeding s/p CABG - coagulopathy without mechanical source  . PARTIAL GASTRECTOMY    . PERCUTANEOUS CORONARY STENT INTERVENTION (PCI-S) N/A 03/02/2011   Procedure: PERCUTANEOUS CORONARY STENT INTERVENTION (PCI-S);  Surgeon: Jettie Booze, MD;  Location: [facility] LAB;  Service: Cardiovascular;  Laterality: N/A;  . PERCUTANEOUS CORONARY STENT INTERVENTION (PCI-S)  05/19/2011   Procedure: PERCUTANEOUS CORONARY STENT INTERVENTION (PCI-S);  Surgeon: Jettie Booze, MD;  Location: [facility] CATH LAB;  Service: Cardiovascular;;  . RADIAL ARTERY HARVEST  11/22/2010   Procedure: RADIAL ARTERY HARVEST;  Surgeon: Rexene Alberts, MD;  Location: [place];  Service: Open Heart Surgery;  Laterality: Left;  . Redo CABG  11/22/2010   CABG x2 with left radial to OM and SVG to RCA  . Right testicle surgery       Current Meds  Medication Sig  . aspirin EC 81 MG tablet Take 81 mg by mouth at bedtime.  Marland Kitchen atorvastatin (LIPITOR) 80 MG tablet Take 1 tablet (80 mg total) by mouth at bedtime.  . clopidogrel  (PLAVIX) 75 MG tablet Take 1 tablet (75 mg total) by mouth at bedtime.  Marland Kitchen ezetimibe (ZETIA) 10 MG tablet Take 1 tablet (10 mg total) by mouth at bedtime.  . feeding supplement, ENSURE ENLIVE, (ENSURE ENLIVE) LIQD Take 237 mLs by mouth 2 (two) times daily between meals.  . folic acid (FOLVITE) 1 MG  tablet TAKE 1 TABLET BY MOUTH AT BEDTIME  . nitroGLYCERIN (NITROSTAT) 0.4 MG SL tablet Place 0.4 mg under the tongue every 5 (five) minutes as needed for chest pain. For chest pain  . Pedialyte (PEDIALYTE) SOLN Take 240 mLs by mouth daily.      Family History: The patient's family history includes Alzheimer's disease in his sister; Bowel Disease in his brother; Cancer in his brother and father; Heart attack in his sister; Stroke in his brother; Unexplained death in his brother and mother.   ROS:   Please see the history of present illness.     All other systems reviewed and are negative.   Labs/Other Tests and Data Reviewed:     Recent Labs: 03/25/2019: ALT 25 03/26/2019: Hemoglobin 10.8; Platelets 244 03/27/2019: Magnesium 1.9 03/28/2019: BUN 30; Creatinine, Ser 0.86; Potassium 4.2; Sodium 144  Recent Lipid Panel    Component Value Date/Time   CHOL 117 03/01/2011 0531   TRIG 68 03/01/2011 0531   HDL 42 03/01/2011 0531   CHOLHDL 2.8 03/01/2011 0531   VLDL 14 03/01/2011 0531   LDLCALC 61 03/01/2011 0531      Exam:    Vital Signs:  BP (!) 90/52   Pulse 82   Temp 97.8 F (36.6 C) (Temporal)   Ht 5\' 6"  (1.676 m)   Wt 100 lb (45.4 kg)   SpO2 (!) 82%   BMI 16.14 kg/m     Wt Readings from Last 3 Encounters:  04/10/19 100 lb (45.4 kg)  03/24/19 100 lb 3.2 oz (45.5 kg)  12/17/18 109 lb 8 oz (49.7 kg)     Well nourished, well developed in no acute distress. Alert awake and attentive.  He recognized me on the phone.  Without any distress at the time of my interview.  No chest pain no tightness pressure burning in the chest  Diagnosis for this visit:   1. Paroxysmal atrial  fibrillation (HCC)   2. Mixed hyperlipidemia   3. Presence of aortocoronary bypass graft   4. History of upper GI bleeding      ASSESSMENT & PLAN:    1.  Paroxysmal atrial fibrillation seems to be maintaining sinus rhythm, not anticoagulated because of history of GI bleed.  We will continue present management. Dyslipidemia, continue high intensity statin as well as Zetia. 3.  Coronary artery disease, status post coronary artery bypass grafting in 2012.  No recent issue.  Seems to be stable from that point review. 4.  History of upper GI bleed, not anticoagulated because of that.  Overall he is improving.  However it is a slow process which obviously with his age is very difficult.  I will not change any of his medications at this stage.  I will see him back in about 2 months to see how he does.  COVID-19 Education: The signs and symptoms of COVID-19 were discussed with the patient and how to seek care for testing (follow up with PCP or arrange E-visit).  The importance of social distancing was discussed today.  Patient Risk:   After full review of this patients clinical status, I feel that they are at least moderate risk at this time.  Time:   Today, I have spent 5 minutes with the patient with telehealth technology discussing pt health issues.  I spent 20 minutes reviewing her chart before the visit.  Visit was finished at 1:53 PM.    Medication Adjustments/Labs and Tests Ordered: Current medicines are reviewed at length with the patient  today.  Concerns regarding medicines are outlined above.  No orders of the defined types were placed in this encounter.  Medication changes: No orders of the defined types were placed in this encounter.    Disposition: Follow-up 2 months  Signed, Park Liter, MD, [facility] 04/11/2019 1:50 PM    [place]

## 2019-05-17 DEATH — deceased

## 2019-06-05 ENCOUNTER — Ambulatory Visit: Payer: Medicare Other | Admitting: Cardiology

## 2019-06-20 ENCOUNTER — Ambulatory Visit: Payer: Medicare Other | Admitting: Cardiology

## 2020-09-16 IMAGING — DX DG ABD PORTABLE 1V
1 series · 1 of 1 positions shown · non-contrast
Comparison: Plain film of the abdomen dated 07/12/2017.

CLINICAL DATA: Shortness of breath one day.

EXAM:
PORTABLE ABDOMEN - 1 VIEW

[abdomen kub]
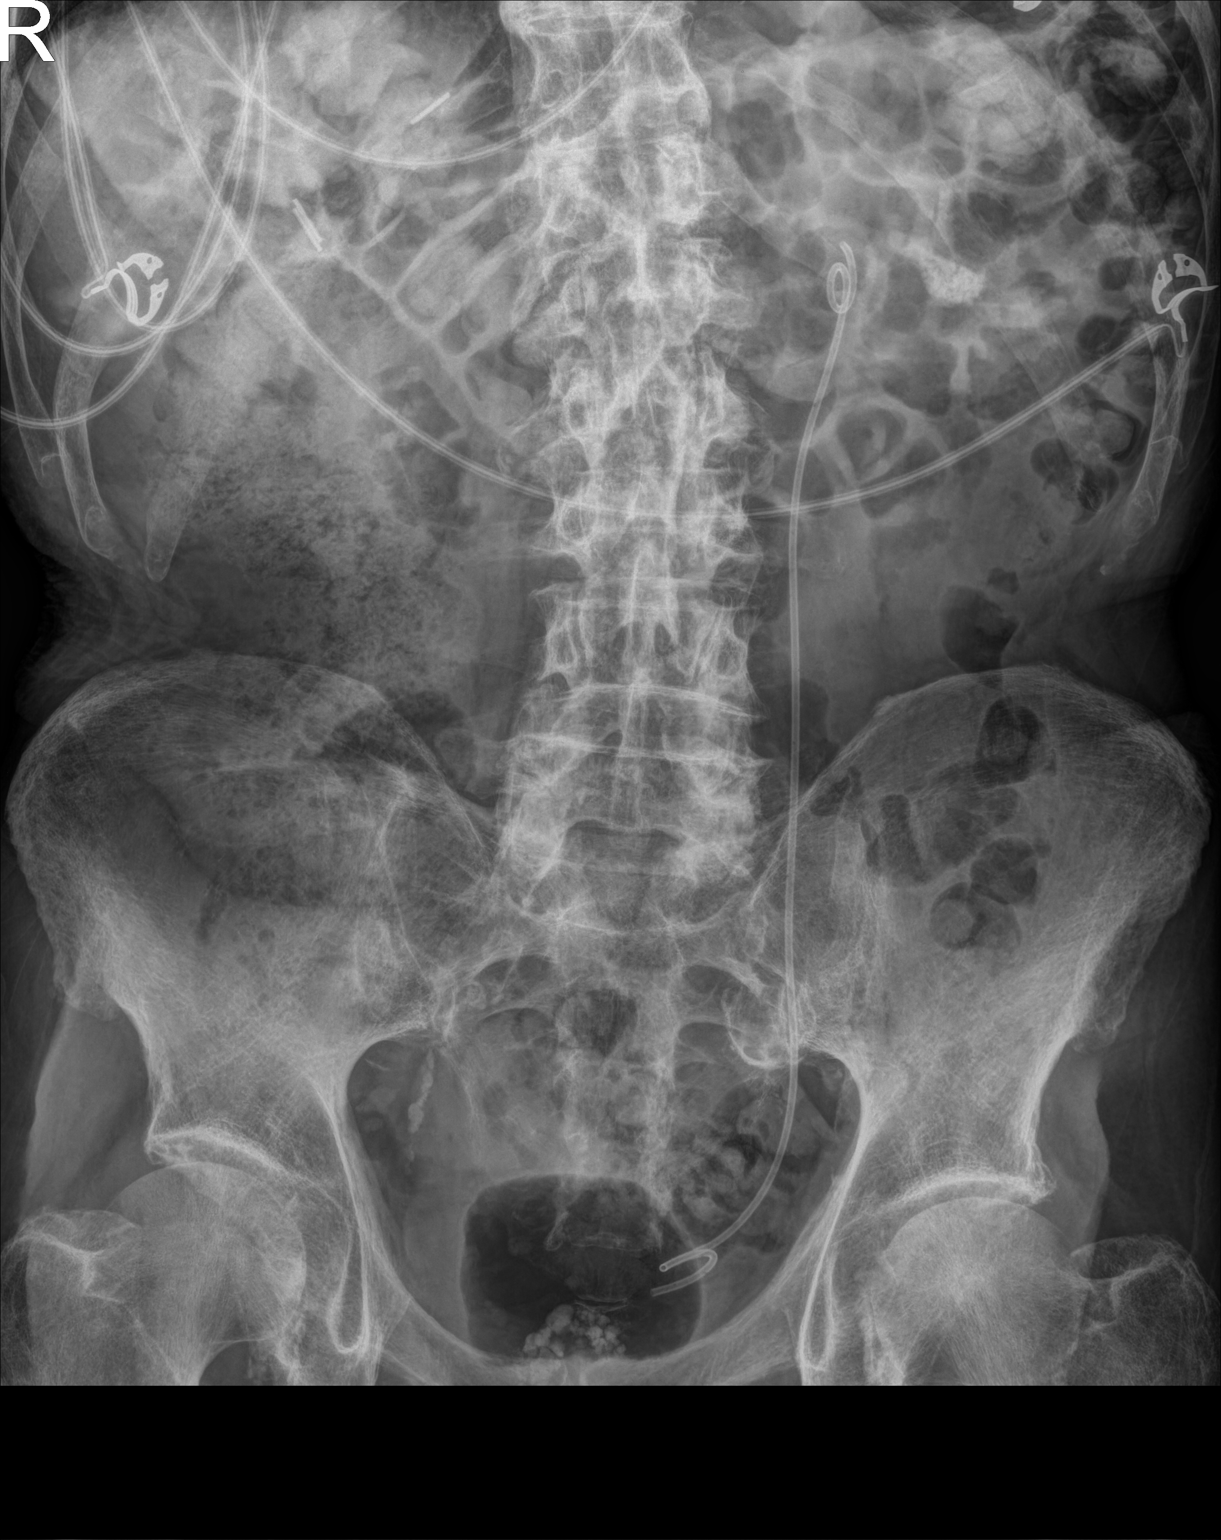

[1 of 1 positions shown; findings below may reference images not displayed]

FINDINGS: Fairly large amount of stool in the RIGHT colon. Overall bowel gas
pattern appears nonobstructive. LEFT-sided nephroureteral stent in
place. 2 cm LEFT renal stone, not appreciably changed compared to
earlier CT abdomen of 06/13/2017. Cholecystectomy clips in the RIGHT
upper quadrant.
IMPRESSION: 1. Fairly large amount of stool in the RIGHT colon (constipation?).
2. Overall bowel gas pattern is nonobstructive.
3. LEFT nephrolithiasis. LEFT nephroureteral stent appears
appropriately positioned.

## 2020-09-16 IMAGING — DX DG CHEST 1V PORT
1 series · 1 of 1 positions shown · non-contrast
Comparison: Chest x-rays dated 10/19/2017, 12/08/2016 and
07/07/2016.

CLINICAL DATA: Shortness of breath for a day.  Former smoker.

EXAM:
PORTABLE CHEST 1 VIEW

[chest ap]
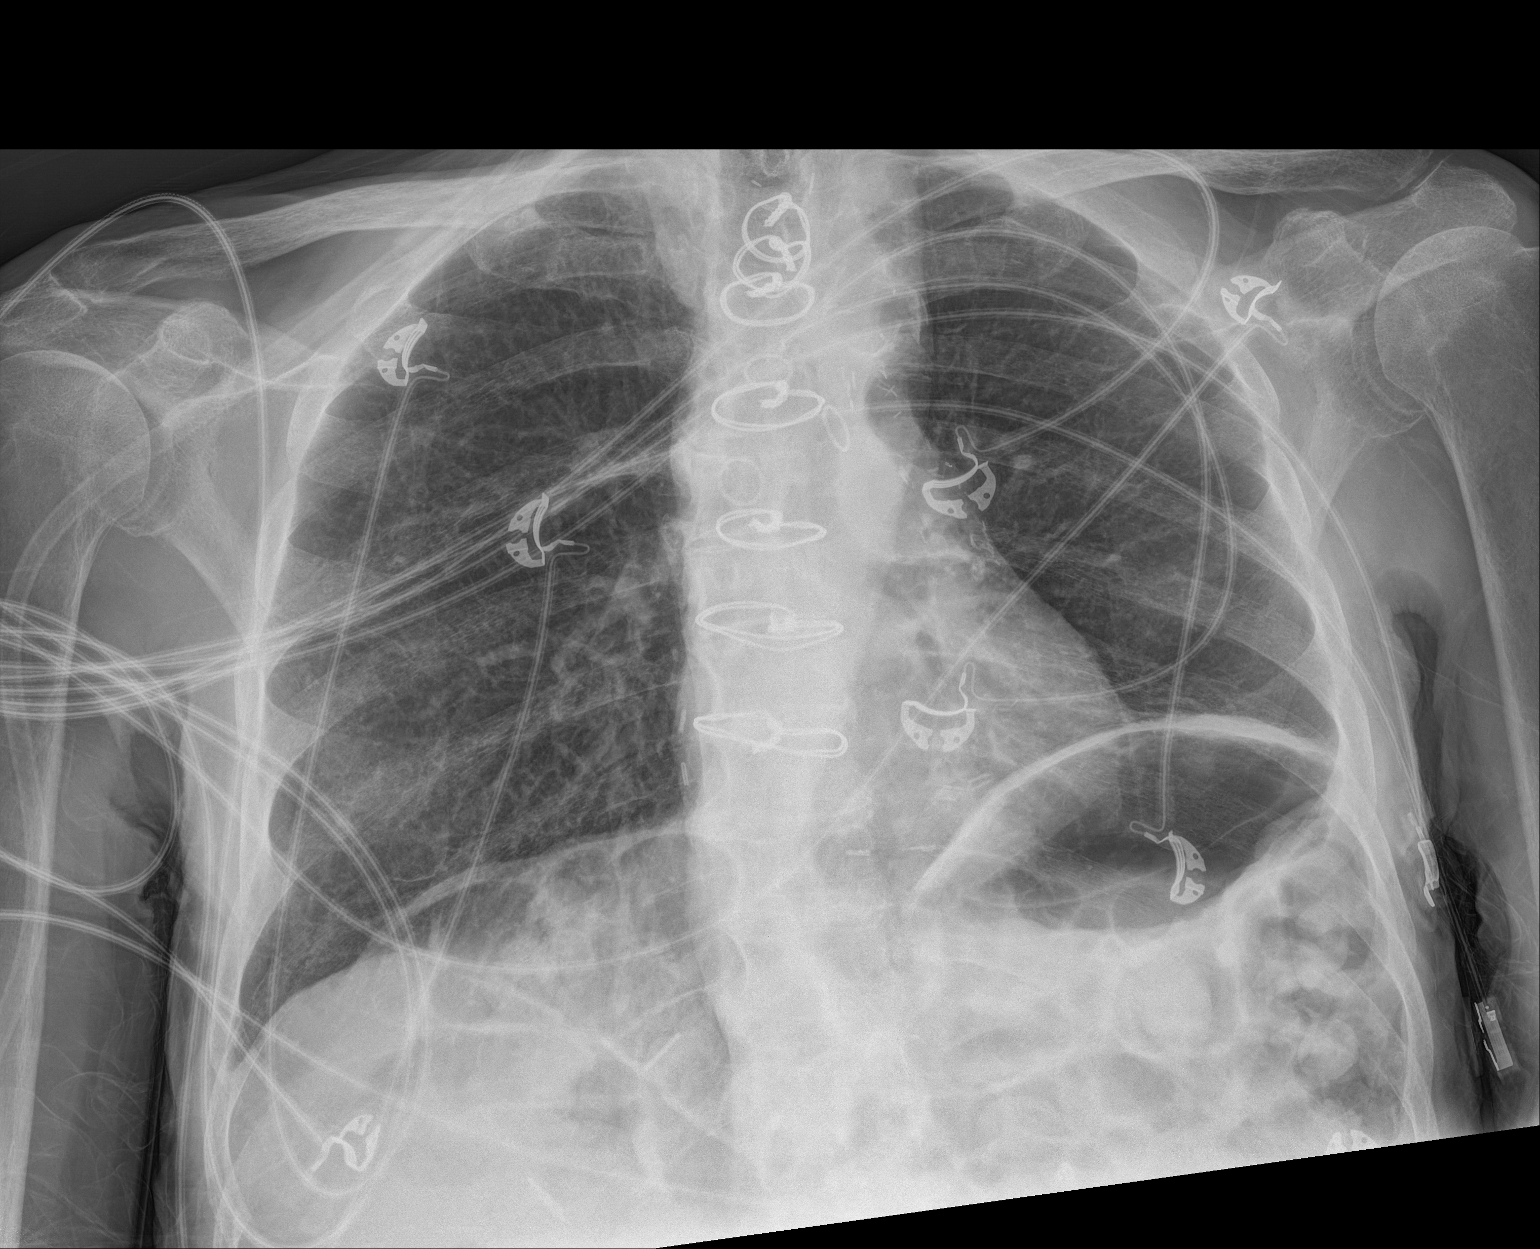

[1 of 1 positions shown; findings below may reference images not displayed]

FINDINGS: Heart size and mediastinal contours are stable. Median sternotomy
wires are intact and stable in alignment. Coarse interstitial lung
markings are again seen bilaterally indicating chronic interstitial
lung disease no new confluent opacity to suggest a developing
pneumonia. No pleural effusion or pneumothorax seen.

The retrocardiac nodule described on most recent chest x-ray of
10/19/2017 is not as well seen on today's exam, likely obscured by
the overlying diaphragm, corresponding to the suspicious spiculated
LEFT lower lobe pulmonary nodule described on chest CT and PET-CT in
[id number].
IMPRESSION: 1. No active disease.  No evidence of pneumonia or pulmonary edema.
2. Chronic interstitial lung disease.
